# Patient Record
Sex: Female | Born: 1950
Health system: Southern US, Community
[De-identification: ages and names within clinical notes are randomized; demographics above are authoritative.]

## PROBLEM LIST (undated history)

## (undated) DIAGNOSIS — B49 Unspecified mycosis: Secondary | ICD-10-CM

## (undated) DIAGNOSIS — F329 Major depressive disorder, single episode, unspecified: Secondary | ICD-10-CM

## (undated) DIAGNOSIS — E7800 Pure hypercholesterolemia, unspecified: Secondary | ICD-10-CM

## (undated) DIAGNOSIS — F32A Depression, unspecified: Secondary | ICD-10-CM

## (undated) DIAGNOSIS — I10 Essential (primary) hypertension: Secondary | ICD-10-CM

## (undated) DIAGNOSIS — F419 Anxiety disorder, unspecified: Secondary | ICD-10-CM

## (undated) DIAGNOSIS — R51 Headache: Secondary | ICD-10-CM

## (undated) DIAGNOSIS — G4733 Obstructive sleep apnea (adult) (pediatric): Secondary | ICD-10-CM

## (undated) DIAGNOSIS — R030 Elevated blood-pressure reading, without diagnosis of hypertension: Secondary | ICD-10-CM

## (undated) HISTORY — DX: Essential (primary) hypertension: I10

## (undated) HISTORY — DX: Obstructive sleep apnea (adult) (pediatric): G47.33

## (undated) HISTORY — DX: Unspecified mycosis: B49

## (undated) HISTORY — PX: BREAST EXCISIONAL BIOPSY: SUR124

## (undated) HISTORY — DX: Pure hypercholesterolemia, unspecified: E78.00

## (undated) HISTORY — DX: Major depressive disorder, single episode, unspecified: F32.9

## (undated) HISTORY — DX: Depression, unspecified: F32.A

## (undated) HISTORY — DX: Headache: R51

## (undated) HISTORY — DX: Anxiety disorder, unspecified: F41.9

---
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
## 1998-12-25 ENCOUNTER — Other Ambulatory Visit: Admission: RE | Admit: 1998-12-25 | Discharge: 1998-12-25 | Payer: Self-pay | Admitting: Family Medicine

## 2001-01-30 ENCOUNTER — Other Ambulatory Visit: Admission: RE | Admit: 2001-01-30 | Discharge: 2001-01-30 | Payer: Self-pay | Admitting: Family Medicine

## 2002-07-23 ENCOUNTER — Other Ambulatory Visit: Admission: RE | Admit: 2002-07-23 | Discharge: 2002-07-23 | Payer: Self-pay | Admitting: Family Medicine

## 2004-02-24 ENCOUNTER — Other Ambulatory Visit: Admission: RE | Admit: 2004-02-24 | Discharge: 2004-02-24 | Payer: Self-pay | Admitting: Family Medicine

## 2008-01-04 ENCOUNTER — Ambulatory Visit: Payer: Self-pay | Admitting: *Deleted

## 2008-01-04 ENCOUNTER — Encounter: Payer: Self-pay | Admitting: Emergency Medicine

## 2008-01-05 ENCOUNTER — Inpatient Hospital Stay (HOSPITAL_COMMUNITY): Admission: RE | Admit: 2008-01-05 | Discharge: 2008-01-12 | Payer: Self-pay | Admitting: *Deleted

## 2008-01-16 ENCOUNTER — Other Ambulatory Visit (HOSPITAL_COMMUNITY): Admission: RE | Admit: 2008-01-16 | Discharge: 2008-01-28 | Payer: Self-pay | Admitting: Psychiatry

## 2008-01-17 ENCOUNTER — Ambulatory Visit: Payer: Self-pay | Admitting: Psychiatry

## 2008-02-04 ENCOUNTER — Ambulatory Visit (HOSPITAL_COMMUNITY): Payer: Self-pay | Admitting: Psychiatry

## 2008-02-11 ENCOUNTER — Ambulatory Visit (HOSPITAL_COMMUNITY): Payer: Self-pay | Admitting: Psychiatry

## 2008-02-18 ENCOUNTER — Ambulatory Visit (HOSPITAL_COMMUNITY): Payer: Self-pay | Admitting: Psychiatry

## 2008-05-29 ENCOUNTER — Ambulatory Visit (HOSPITAL_COMMUNITY): Admission: RE | Admit: 2008-05-29 | Discharge: 2008-05-29 | Payer: Self-pay | Admitting: Family Medicine

## 2009-04-09 ENCOUNTER — Other Ambulatory Visit (HOSPITAL_COMMUNITY): Payer: Self-pay | Admitting: Emergency Medicine

## 2009-04-09 ENCOUNTER — Ambulatory Visit: Payer: Self-pay | Admitting: Psychiatry

## 2009-04-10 ENCOUNTER — Inpatient Hospital Stay (HOSPITAL_COMMUNITY): Admission: EM | Admit: 2009-04-10 | Discharge: 2009-04-20 | Payer: Self-pay | Admitting: Psychiatry

## 2009-05-11 ENCOUNTER — Ambulatory Visit (HOSPITAL_COMMUNITY): Payer: Self-pay | Admitting: Psychiatry

## 2009-05-21 ENCOUNTER — Ambulatory Visit (HOSPITAL_COMMUNITY): Payer: Self-pay | Admitting: Psychiatry

## 2009-06-23 ENCOUNTER — Ambulatory Visit (HOSPITAL_COMMUNITY): Payer: Self-pay | Admitting: Psychiatry

## 2009-06-30 ENCOUNTER — Ambulatory Visit (HOSPITAL_COMMUNITY): Payer: Self-pay | Admitting: Psychiatry

## 2009-07-09 ENCOUNTER — Ambulatory Visit (HOSPITAL_COMMUNITY): Payer: Self-pay | Admitting: Psychiatry

## 2009-07-21 ENCOUNTER — Ambulatory Visit (HOSPITAL_COMMUNITY): Payer: Self-pay | Admitting: Psychiatry

## 2009-07-23 ENCOUNTER — Ambulatory Visit (HOSPITAL_COMMUNITY): Payer: Self-pay | Admitting: Psychiatry

## 2009-08-18 ENCOUNTER — Ambulatory Visit (HOSPITAL_COMMUNITY): Payer: Self-pay | Admitting: Psychiatry

## 2009-09-01 ENCOUNTER — Ambulatory Visit (HOSPITAL_COMMUNITY): Payer: Self-pay | Admitting: Psychiatry

## 2009-10-13 ENCOUNTER — Ambulatory Visit (HOSPITAL_COMMUNITY): Payer: Self-pay | Admitting: Psychiatry

## 2009-12-08 ENCOUNTER — Ambulatory Visit (HOSPITAL_COMMUNITY): Payer: Self-pay | Admitting: Psychiatry

## 2010-01-07 ENCOUNTER — Ambulatory Visit (HOSPITAL_COMMUNITY): Payer: Self-pay | Admitting: Psychiatry

## 2010-02-09 ENCOUNTER — Ambulatory Visit (HOSPITAL_COMMUNITY): Payer: Self-pay | Admitting: Psychiatry

## 2010-02-15 ENCOUNTER — Ambulatory Visit (HOSPITAL_COMMUNITY): Payer: Self-pay | Admitting: Psychiatry

## 2010-03-01 ENCOUNTER — Ambulatory Visit (HOSPITAL_COMMUNITY): Payer: Self-pay | Admitting: Psychiatry

## 2010-04-06 ENCOUNTER — Ambulatory Visit (HOSPITAL_COMMUNITY): Payer: Self-pay | Admitting: Psychiatry

## 2010-06-03 ENCOUNTER — Ambulatory Visit (HOSPITAL_COMMUNITY)
Admission: RE | Admit: 2010-06-03 | Discharge: 2010-06-03 | Payer: Self-pay | Source: Home / Self Care | Attending: Psychiatry | Admitting: Psychiatry

## 2010-06-08 ENCOUNTER — Ambulatory Visit (HOSPITAL_COMMUNITY)
Admission: RE | Admit: 2010-06-08 | Discharge: 2010-06-08 | Payer: Self-pay | Source: Home / Self Care | Attending: Psychiatry | Admitting: Psychiatry

## 2010-06-21 ENCOUNTER — Encounter (HOSPITAL_COMMUNITY): Payer: BC Managed Care – PPO | Admitting: Psychiatry

## 2010-06-28 ENCOUNTER — Encounter (INDEPENDENT_AMBULATORY_CARE_PROVIDER_SITE_OTHER): Payer: BC Managed Care – PPO | Admitting: Psychiatry

## 2010-06-28 DIAGNOSIS — F331 Major depressive disorder, recurrent, moderate: Secondary | ICD-10-CM

## 2010-07-14 ENCOUNTER — Encounter (HOSPITAL_COMMUNITY): Payer: BC Managed Care – PPO | Admitting: Psychiatry

## 2010-07-29 ENCOUNTER — Encounter (INDEPENDENT_AMBULATORY_CARE_PROVIDER_SITE_OTHER): Payer: BC Managed Care – PPO | Admitting: Psychiatry

## 2010-07-29 DIAGNOSIS — F331 Major depressive disorder, recurrent, moderate: Secondary | ICD-10-CM

## 2010-08-06 ENCOUNTER — Encounter (HOSPITAL_COMMUNITY): Payer: BC Managed Care – PPO | Admitting: Psychiatry

## 2010-08-10 LAB — CBC
Hemoglobin: 12.9 g/dL (ref 12.0–15.0)
RBC: 4.49 MIL/uL (ref 3.87–5.11)
RDW: 13.1 % (ref 11.5–15.5)

## 2010-08-10 LAB — BASIC METABOLIC PANEL
Calcium: 9.5 mg/dL (ref 8.4–10.5)
GFR calc Af Amer: >60 mL/min (ref >60)
GFR calc non Af Amer: >60 mL/min (ref >60)
Sodium: 140 mEq/L (ref 135–145)

## 2010-08-10 LAB — URINE MICROSCOPIC-ADD ON

## 2010-08-10 LAB — DIFFERENTIAL
Basophils Absolute: 0 10*3/uL (ref 0.0–0.1)
Lymphocytes Relative: 22 % (ref 12–46)
Monocytes Absolute: 0.5 10*3/uL (ref 0.1–1.0)
Monocytes Relative: 8 % (ref 3–12)
Neutro Abs: 3.9 10*3/uL (ref 1.7–7.7)

## 2010-08-10 LAB — URINALYSIS, ROUTINE W REFLEX MICROSCOPIC
Bilirubin Urine: NEGATIVE
Glucose, UA: NEGATIVE mg/dL
Ketones, ur: NEGATIVE mg/dL
pH: 5.5 (ref 5.0–8.0)

## 2010-08-10 LAB — ETHANOL: Alcohol, Ethyl (B): <5 mg/dL (ref 0–10)

## 2010-08-10 LAB — TSH: TSH: 0.919 u[IU]/mL (ref 0.350–4.500)

## 2010-08-10 LAB — RAPID URINE DRUG SCREEN, HOSP PERFORMED
Cocaine: NOT DETECTED
Opiates: NOT DETECTED
Tetrahydrocannabinol: NOT DETECTED

## 2010-09-21 NOTE — Discharge Summary (Signed)
NAMEEDWIN, CHERIAN NO.:  [id number]   MEDICAL RECORD NO.:  [id number]          PATIENT TYPE:  IPS   LOCATION:  0307                          FACILITY:  [facility]   PHYSICIAN:  Jasmine Pang, M.D. DATE OF BIRTH:  11/15/1950   DATE OF ADMISSION:  01/05/2008  DATE OF DISCHARGE:  01/12/2008                               DISCHARGE SUMMARY   IDENTIFICATION:  This is a 60 year old widowed white female who came to  the hospital on a voluntary basis on January 04, 2008.   HISTORY OF PRESENT ILLNESS:  The patient had been to see her doctor and  reported she was having disturbing intrusive thoughts.  These were  homicidal thoughts.  She stated she has been having these thoughts since  several days ago and they are getting worse.  She is afraid she would  act on them and the targets are her mother and her sons.  She is afraid  to go home.  She states she knows she would not harm them, but is very  disturbed by these thoughts and feels out of control.  The patient is  under considerable stress.  Her father died last 2022-07-15 after an  illness.  Her husband died 10 years ago suddenly leaving her to raise 3  young boys alone.  Mother has been ill and has recently gone to a  nursing home.  The patient is handling mother's affairs, which is very  complicated.  Her 37 year old son is in college and she is trying to get  financial aid.  Her 59 year old and 75 year old son live in the home.  Her 16 year old son is married.   PAST PSYCHIATRIC HISTORY:  This is the first [facility] admission for the  patient.   DRUG AND ALCOHOL HISTORY:  No alcohol or drug abuse.   MEDICAL HISTORY:  Hypercholesterolemia.   MEDICATIONS:  Zoloft.   PHYSICAL FINDINGS:  There were no acute physical or medical problems  noted.  Her full physical exam was done in the [facility] ED.   ADMISSION LABORATORIES:  Alcohol level was less than 5.  UA revealed  wbc's of 3-6.  UDS was negative.  Acetaminophen was  less than 10.   HOSPITAL COURSE:  Upon admission, the patient was restarted on her  Zoloft 50 mg p.o. daily.  She was also placed on Risperdal 0.5 mg p.o.  q.h.s.  She was also started on Ativan 0.5 mg p.o. q.6 h. p.r.n.  anxiety.  In individual sessions, the patient continued to feel anxious  about her intrusive thoughts.  Her mood was depressed.  Affect  consistent with mood, tearful.  There was positive suicidal ideation.  She was still struggling with thoughts of suicide due to her intrusive  thoughts.  There were no auditory or visual hallucinations.  She was  worried she would not get better and this made her more anxious.  Mood  was depressed and anxious.  On January 10, 2008, she was tearful and  wringing her hands.  She still has intrusive thoughts of hurting others.  She still has positive suicidal ideation.  She has a  family session with  her older son and mother-in-law scheduled.  She sees them both as a  support.  The Zoloft was increased to 100 mg p.o. daily to help with  depression and anxiety.  She was also started on Ativan 0.5 mg p.o.  q.i.d. as a standing dose, p.r.n. Ativan was continued.  She remained  worried about all of the work she had to do when she left the hospital.  She was in charge of her mother's financial and legal affairs, since her  mother who developed Alzheimer's has gone into a nursing home.  On  January 11, 2008, mental status had improved somewhat.  There was still  some anxiety.  Her sleep was good.  Appetite was good.  She was having  some intrusive thoughts, but they were less than earlier.  She has no  plans to harm anyone and she is able to distinguish the facts these are  false not something she plans to do.  Her suicidal ideation had  resolved.  The patient's mother-in-law and son were very supportive of  the patient during the hospitalization and in the family session.  The  patient felt relieved that her 15 year old son was supportive and  was  not distancing himself because of her intrusive thoughts.  She was  hardened to hear him saying he still loved her very much.  On January 12, 2008, the patient stated I feel much better.  Sleep was good.  Appetite was good.  Mood was less depressed and less anxious.  Affect  consistent with mood.  There was no suicidal or homicidal ideation.  No  thoughts of self-injurious behavior.  No auditory or visual  hallucinations.  No paranoia or delusions.  Thoughts were logical and  goal-directed.  Thought content, no predominant theme.  Cognitive was  grossly intact.  Insight was good.  Judgment was good.  Impulse control  was good.  It was felt the patient was safe for discharge today and she  felt ready to go home.  She reported she was no longer distressed by her  intrusive thoughts and was able to make them go away when they did  insert themselves.   DISCHARGE DIAGNOSES:  Axis I: Major depression, recurrent, severe  without psychosis, features of obsessive-compulsive disorder.  Axis II: None.  Axis III: Hypercholesterolemia.  Axis IV: Moderate (problems primary support group, economic problem,  other psychosocial problems).  Axis V: Global assessment of functioning was 50 upon discharge.  GAF was  35 upon admission.  GAF highest past year was 75-80.   DISCHARGE PLANS:  There was no specific activity or dietary restriction.   POST HOSPITAL CARE PLANS:  The patient will go to the [facility]  Intensive Outpatient Program on Tuesday at 8:30 a.m., on January 15, 2008.   DISCHARGE MEDICATIONS:  1. Risperdal 0.5 mg p.o. q.h.s.  2. Zoloft 100 mg daily.  3. Ativan 0.5 mg four times daily and once daily as needed.      Jasmine Pang, M.D.  Electronically Signed     [facility]/MEDQ  D:  01/12/2008  T:  01/12/2008  Job:  [id number]

## 2010-09-23 ENCOUNTER — Encounter (HOSPITAL_COMMUNITY): Payer: BC Managed Care – PPO | Admitting: Psychiatry

## 2010-09-30 ENCOUNTER — Encounter (INDEPENDENT_AMBULATORY_CARE_PROVIDER_SITE_OTHER): Payer: BC Managed Care – PPO | Admitting: Psychiatry

## 2010-09-30 DIAGNOSIS — F331 Major depressive disorder, recurrent, moderate: Secondary | ICD-10-CM

## 2010-10-28 ENCOUNTER — Encounter (INDEPENDENT_AMBULATORY_CARE_PROVIDER_SITE_OTHER): Payer: BC Managed Care – PPO | Admitting: Psychiatry

## 2010-10-28 DIAGNOSIS — F331 Major depressive disorder, recurrent, moderate: Secondary | ICD-10-CM

## 2010-11-05 ENCOUNTER — Encounter (INDEPENDENT_AMBULATORY_CARE_PROVIDER_SITE_OTHER): Payer: BC Managed Care – PPO | Admitting: Psychiatry

## 2010-11-05 DIAGNOSIS — F331 Major depressive disorder, recurrent, moderate: Secondary | ICD-10-CM

## 2010-11-17 ENCOUNTER — Other Ambulatory Visit: Payer: Self-pay | Admitting: Family Medicine

## 2010-11-17 DIAGNOSIS — R413 Other amnesia: Secondary | ICD-10-CM

## 2010-11-19 ENCOUNTER — Other Ambulatory Visit (HOSPITAL_COMMUNITY): Payer: BC Managed Care – PPO

## 2010-11-19 ENCOUNTER — Encounter (INDEPENDENT_AMBULATORY_CARE_PROVIDER_SITE_OTHER): Payer: Federal, State, Local not specified - PPO | Admitting: Psychiatry

## 2010-11-19 DIAGNOSIS — F331 Major depressive disorder, recurrent, moderate: Secondary | ICD-10-CM

## 2010-11-22 ENCOUNTER — Ambulatory Visit (HOSPITAL_COMMUNITY)
Admission: RE | Admit: 2010-11-22 | Discharge: 2010-11-22 | Disposition: A | Discharge status: Home / Self Care | Payer: Federal, State, Local not specified - PPO | Source: Ambulatory Visit | Attending: Family Medicine | Admitting: Family Medicine

## 2010-11-22 ENCOUNTER — Encounter (HOSPITAL_COMMUNITY): Payer: Self-pay

## 2010-11-22 DIAGNOSIS — R413 Other amnesia: Secondary | ICD-10-CM | POA: Insufficient documentation

## 2010-12-03 ENCOUNTER — Encounter (INDEPENDENT_AMBULATORY_CARE_PROVIDER_SITE_OTHER): Payer: Federal, State, Local not specified - PPO | Admitting: Psychiatry

## 2010-12-03 DIAGNOSIS — F331 Major depressive disorder, recurrent, moderate: Secondary | ICD-10-CM

## 2010-12-09 ENCOUNTER — Encounter (INDEPENDENT_AMBULATORY_CARE_PROVIDER_SITE_OTHER): Payer: Federal, State, Local not specified - PPO | Admitting: Psychiatry

## 2010-12-09 DIAGNOSIS — F331 Major depressive disorder, recurrent, moderate: Secondary | ICD-10-CM

## 2010-12-17 ENCOUNTER — Encounter (HOSPITAL_COMMUNITY): Payer: Federal, State, Local not specified - PPO | Admitting: Psychiatry

## 2011-01-20 ENCOUNTER — Encounter (INDEPENDENT_AMBULATORY_CARE_PROVIDER_SITE_OTHER): Payer: Federal, State, Local not specified - PPO | Admitting: Psychiatry

## 2011-01-20 DIAGNOSIS — F331 Major depressive disorder, recurrent, moderate: Secondary | ICD-10-CM

## 2011-01-21 ENCOUNTER — Encounter (HOSPITAL_COMMUNITY): Payer: Federal, State, Local not specified - PPO | Admitting: Psychiatry

## 2011-03-16 ENCOUNTER — Encounter (HOSPITAL_COMMUNITY): Payer: Self-pay | Admitting: Psychiatry

## 2011-03-17 ENCOUNTER — Ambulatory Visit (INDEPENDENT_AMBULATORY_CARE_PROVIDER_SITE_OTHER): Payer: Federal, State, Local not specified - PPO | Admitting: Psychiatry

## 2011-03-17 ENCOUNTER — Encounter (HOSPITAL_COMMUNITY): Payer: Federal, State, Local not specified - PPO | Admitting: Psychiatry

## 2011-03-17 DIAGNOSIS — F331 Major depressive disorder, recurrent, moderate: Secondary | ICD-10-CM

## 2011-03-17 MED ORDER — RISPERIDONE 3 MG PO TABS: 3.0000 mg | ORAL_TABLET | Freq: Every day | ORAL | Status: DC

## 2011-03-17 MED ORDER — CLONAZEPAM 0.5 MG PO TABS: 0.5000 mg | ORAL_TABLET | Freq: Two times a day (BID) | ORAL | Status: DC | PRN

## 2011-03-17 MED ORDER — BENZTROPINE MESYLATE 0.5 MG PO TABS: 0.5000 mg | ORAL_TABLET | Freq: Every day | ORAL | Status: DC

## 2011-03-17 MED ORDER — SERTRALINE HCL 100 MG PO TABS: 100.0000 mg | ORAL_TABLET | Freq: Every day | ORAL | Status: DC

## 2011-03-17 MED ORDER — BUPROPION HCL ER (XL) 300 MG PO TB24: 300.0000 mg | ORAL_TABLET | Freq: Every day | ORAL | Status: DC

## 2011-03-17 NOTE — Progress Notes (Signed)
Patient came for her followup appointment she has been noticed more depressed lately. She is more disappointed as she did not get the job. She is concerned about her financial stress and not taking money out of her IRA though she is still looking for a job and may get another job interview next week. Her son started school in January for nursing and she is very happy about. She endorses decreased energy more crying spells and sometimes feeling of helpless. However ever her appetite and sleep is unchanged. She continued to have ruminations about her not able to get a job and able to help her family. She endorse racing thoughts and occasional crying spells. I reviewed the chart she was taking 3 mg as above and we have reduced to 2 mg in past few months and she was doing better, I discussed with the patient to restart 3 mg to target her residual depression. She denies any agitation anger paranoid thinking or any side effects of medication. She is compliant with her medication which are Zoloft Klonopin Cogentin Wellbutrin and Risperdal. There are no abnormal movements noted. Mental status examination patient is casually dressed and fairly groomed. Her speech is slow but coherent. She described her mood as depressed and anxious and her affect was constricted. She denies any active or passive suicidal thinking or any auditory or visual hallucination. There were no paranoid thinking but she has poverty of thought content. She is alert and oriented x3. Her thought process is logical linear but slow. Her insight judgment and impulse control is okay. Assessment Maj. depressive disorder recurrent Plan we will increase her Risperdal to 3 mg at bedtime, she will continue her Klonopin Zoloft Cogentin. We also talked about safety plan that in case if she starts feeling worse in other depression or any time having suicidal thinking and homicidal thinking then she need to call us immediately. I have increased the risk and  benefits of medication including the metabolic side effects of Risperdal. One more time I encouraged her to see a therapist however patient declined. I will see her again in 3-4 weeks. Time spent 45 minutes.

## 2011-04-12 ENCOUNTER — Ambulatory Visit (HOSPITAL_COMMUNITY): Payer: Federal, State, Local not specified - PPO | Admitting: Psychiatry

## 2011-04-14 ENCOUNTER — Other Ambulatory Visit (HOSPITAL_COMMUNITY): Payer: Self-pay | Admitting: Psychiatry

## 2011-04-16 ENCOUNTER — Other Ambulatory Visit (HOSPITAL_COMMUNITY): Payer: Self-pay | Admitting: Psychiatry

## 2011-04-17 ENCOUNTER — Other Ambulatory Visit (HOSPITAL_COMMUNITY): Payer: Self-pay | Admitting: Psychiatry

## 2011-04-18 ENCOUNTER — Other Ambulatory Visit (HOSPITAL_COMMUNITY): Payer: Self-pay | Admitting: Psychiatry

## 2011-04-20 ENCOUNTER — Other Ambulatory Visit (HOSPITAL_COMMUNITY): Payer: Self-pay | Admitting: Psychiatry

## 2011-04-21 ENCOUNTER — Other Ambulatory Visit (HOSPITAL_COMMUNITY): Payer: Self-pay | Admitting: Psychiatry

## 2011-05-24 ENCOUNTER — Other Ambulatory Visit (HOSPITAL_COMMUNITY): Payer: Self-pay | Admitting: Psychiatry

## 2011-05-24 DIAGNOSIS — F331 Major depressive disorder, recurrent, moderate: Secondary | ICD-10-CM

## 2011-05-24 MED ORDER — BENZTROPINE MESYLATE 0.5 MG PO TABS: 0.5000 mg | ORAL_TABLET | Freq: Every day | ORAL | Status: DC

## 2011-05-24 MED ORDER — RISPERIDONE 3 MG PO TABS: 3.0000 mg | ORAL_TABLET | Freq: Every day | ORAL | Status: DC

## 2011-05-24 MED ORDER — CLONAZEPAM 0.5 MG PO TABS: 0.5000 mg | ORAL_TABLET | Freq: Two times a day (BID) | ORAL | Status: DC | PRN

## 2011-05-24 MED ORDER — BUPROPION HCL ER (XL) 300 MG PO TB24: 300.0000 mg | ORAL_TABLET | Freq: Every day | ORAL | Status: DC

## 2011-05-24 MED ORDER — SERTRALINE HCL 100 MG PO TABS: 100.0000 mg | ORAL_TABLET | Freq: Every day | ORAL | Status: DC

## 2011-06-02 ENCOUNTER — Ambulatory Visit (INDEPENDENT_AMBULATORY_CARE_PROVIDER_SITE_OTHER): Payer: Federal, State, Local not specified - PPO | Admitting: Psychiatry

## 2011-06-02 DIAGNOSIS — F331 Major depressive disorder, recurrent, moderate: Secondary | ICD-10-CM

## 2011-06-02 MED ORDER — BENZTROPINE MESYLATE 0.5 MG PO TABS: 0.5000 mg | ORAL_TABLET | Freq: Every day | ORAL | Status: DC

## 2011-06-02 MED ORDER — RISPERIDONE 3 MG PO TABS: 3.0000 mg | ORAL_TABLET | Freq: Every day | ORAL | Status: DC

## 2011-06-02 MED ORDER — BUPROPION HCL ER (XL) 300 MG PO TB24: 300.0000 mg | ORAL_TABLET | Freq: Every day | ORAL | Status: DC

## 2011-06-02 MED ORDER — CLONAZEPAM 0.5 MG PO TABS: 0.5000 mg | ORAL_TABLET | Freq: Two times a day (BID) | ORAL | Status: DC | PRN

## 2011-06-02 MED ORDER — SERTRALINE HCL 100 MG PO TABS: 100.0000 mg | ORAL_TABLET | Freq: Every day | ORAL | Status: DC

## 2011-06-02 NOTE — Progress Notes (Signed)
Patient came for her followup appointment. She is doing better from the past. She likes increase Risperdal which is helping her sleep. She also excited as she is able to get a job and will start next week. Patient younger son also going to [place] as he also got job. Overall patient is doing better and reported no concern or side effects of medication. She denies any crying spells agitation or social isolation. She still has a lot of financial distress as she to cut money from her husband's retirement but she is relief as she is now got a job. She denies any tremors or side effects of medication. She is compliant with her medication which are Zoloft Klonopin Cogentin Wellbutrin and Risperdal.  Patient had missed her last appointment. Patient has history of multiple no shows and I have discussed in detail about the hospital policy that she need to keep appointment.   Mental status examination  Patient is casually dressed and fairly groomed. She described her mood is good and her affect is bright from the past. Her attention and concentration is also improved from the past. Her speech is soft and clear. Her thought process is slow but logical linear and goal-directed. She denies any active or passive suicidal thoughts or homicidal thoughts. There no psychotic symptoms present. She's alert and oriented x3. Her insight judgment and pulse control is okay.   Assessment  Maj. depressive disorder recurrent  Plan  At this time patient is doing better with increased Risperdal and with the hope that new job to help her financially. I explained risks and benefits of medication. At this time patient does not want to see therapist despite encouragement. we will increase her Risperdal to 3 mg at bedtime, she will continue her Klonopin Zoloft Cogentin. We also talked about safety plan that in case if she starts feeling worse in other depression or any time having suicidal thinking and homicidal thinking then she need  to call us immediately. I will see her again in 8 weeks. Refills of all her medication is given.

## 2011-06-16 ENCOUNTER — Other Ambulatory Visit (HOSPITAL_COMMUNITY): Payer: Self-pay | Admitting: Psychiatry

## 2011-06-29 ENCOUNTER — Other Ambulatory Visit (HOSPITAL_COMMUNITY): Payer: Self-pay | Admitting: Psychiatry

## 2011-06-30 ENCOUNTER — Other Ambulatory Visit (HOSPITAL_COMMUNITY): Payer: Self-pay | Admitting: Psychiatry

## 2011-07-19 ENCOUNTER — Other Ambulatory Visit (HOSPITAL_COMMUNITY): Payer: Self-pay | Admitting: Psychiatry

## 2011-07-19 DIAGNOSIS — F331 Major depressive disorder, recurrent, moderate: Secondary | ICD-10-CM

## 2011-07-21 ENCOUNTER — Other Ambulatory Visit (HOSPITAL_COMMUNITY): Payer: Self-pay | Admitting: Psychiatry

## 2011-08-02 ENCOUNTER — Ambulatory Visit (INDEPENDENT_AMBULATORY_CARE_PROVIDER_SITE_OTHER): Payer: Federal, State, Local not specified - PPO | Admitting: Psychiatry

## 2011-08-02 ENCOUNTER — Encounter (HOSPITAL_COMMUNITY): Payer: Self-pay | Admitting: Psychiatry

## 2011-08-02 VITALS — Wt 168.0 lb

## 2011-08-02 DIAGNOSIS — F331 Major depressive disorder, recurrent, moderate: Secondary | ICD-10-CM

## 2011-08-02 MED ORDER — CLONAZEPAM 0.5 MG PO TABS: 0.5000 mg | ORAL_TABLET | Freq: Two times a day (BID) | ORAL | Status: DC | PRN

## 2011-08-02 MED ORDER — RISPERIDONE 3 MG PO TABS: 3.0000 mg | ORAL_TABLET | Freq: Every day | ORAL | Status: DC

## 2011-08-02 MED ORDER — BENZTROPINE MESYLATE 0.5 MG PO TABS
0.5000 mg | ORAL_TABLET | Freq: Every day | ORAL | Status: DC
Start: 2011-08-02 — End: 2011-11-08

## 2011-08-02 MED ORDER — SERTRALINE HCL 100 MG PO TABS: ORAL_TABLET | ORAL | Status: DC

## 2011-08-02 MED ORDER — BUPROPION HCL ER (XL) 300 MG PO TB24: 300.0000 mg | ORAL_TABLET | Freq: Every day | ORAL | Status: DC

## 2011-08-02 NOTE — Progress Notes (Signed)
Chief complaint Medication management and followup.  History of present illness Patient is 61 years old widowed female who came for her followup appointment.  She's been compliant with her medication and reported no side effects.  Recently she has been more anxious and nervous due to financial distress.  She is concerned about not getting job and afraid that she may have to lose her home .  She is using her savings but realize that she needs money for the future .  Patient's son is also actively looking for a job however so far he has not find anything.  Patient is compliant with the medication and reported no side effects.  She likes her current psychiatric medication.  She denies any agitation anger or mood swings.  She denies any crying spells however she has a chronic depression .  She does not want to see therapist as she cannot afford for counseling.  She uses her coping skills to manage her depression most of the time .  She denies any active or passive suicidal thinking and homicidal thinking.    Current psychiatric medication Klonopin 0.5 mg twice a day as needed Risperdal 3 mg at bedtime Wellbutrin 300 mg daily  Zoloft 200 mg daily  Cogentin 0.5 mg at bedtime  Medical history Patient has a high cholesterol and headache.  She see Paulita Cradle.  She is scheduled to see her primary care physician in 3 months for regular checkup and blood work.    Mental status examination  Patient is casually dressed and fairly groomed. She described her mood is anxious and her affect is constricted.  She's cooperative and maintained good eye contact.  Her speech is clear and coherent.  Her thought process logical linear and goal-directed.  Her attention and concentration is fair.  She denies any active or passive suicidal thinking and homicidal thinking.  She denies any auditory or visual hallucination.  There were no psychotic symptoms present at this time.  She's alert and oriented x3.  Her insight  judgment and impulse control is okay.  Assessment Axis I Major depressive disorder recurrent Axis II deferred Axis III see medical history Axis IV moderate   Plan  At this time patient is fairly stable on her current psychotropic medication however she continues to have chronic depression and psychosocial stressors .  She is actively looking for a job .  She is tolerating her medication and denies any side effects.  I will continue all her psychotropic medication.  We will followup on her blood results with her primary care physician.  I recommended to call us if she feels worsening of her symptoms or any question about the medication.  I also talked about the safety plan that at any time if she feels having suicidal thoughts or homicidal thoughts and she need to call 911 or go to local emergency room.  I will see her again in 2 months.

## 2011-09-29 ENCOUNTER — Ambulatory Visit (HOSPITAL_COMMUNITY): Payer: Federal, State, Local not specified - PPO | Admitting: Psychiatry

## 2011-10-02 ENCOUNTER — Other Ambulatory Visit (HOSPITAL_COMMUNITY): Payer: Self-pay | Admitting: Psychiatry

## 2011-10-03 ENCOUNTER — Other Ambulatory Visit (HOSPITAL_COMMUNITY): Payer: Self-pay | Admitting: Psychiatry

## 2011-10-03 NOTE — Telephone Encounter (Signed)
Needs appointment for future refills.

## 2011-10-29 ENCOUNTER — Other Ambulatory Visit (HOSPITAL_COMMUNITY): Payer: Self-pay | Admitting: Psychiatry

## 2011-10-31 ENCOUNTER — Other Ambulatory Visit (HOSPITAL_COMMUNITY): Payer: Self-pay | Admitting: Psychiatry

## 2011-10-31 ENCOUNTER — Telehealth (HOSPITAL_COMMUNITY): Payer: Self-pay | Admitting: *Deleted

## 2011-11-08 ENCOUNTER — Encounter (HOSPITAL_COMMUNITY): Payer: Self-pay | Admitting: Psychiatry

## 2011-11-08 ENCOUNTER — Ambulatory Visit (INDEPENDENT_AMBULATORY_CARE_PROVIDER_SITE_OTHER): Payer: Federal, State, Local not specified - PPO | Admitting: Psychiatry

## 2011-11-08 VITALS — Wt 166.8 lb

## 2011-11-08 DIAGNOSIS — F331 Major depressive disorder, recurrent, moderate: Secondary | ICD-10-CM

## 2011-11-08 DIAGNOSIS — F418 Other specified anxiety disorders: Secondary | ICD-10-CM | POA: Insufficient documentation

## 2011-11-08 DIAGNOSIS — F329 Major depressive disorder, single episode, unspecified: Secondary | ICD-10-CM

## 2011-11-08 DIAGNOSIS — Z79899 Other long term (current) drug therapy: Secondary | ICD-10-CM

## 2011-11-08 LAB — CBC WITH DIFFERENTIAL/PLATELET
Basophils Absolute: 0 10*3/uL (ref 0.0–0.1)
Eosinophils Absolute: 0.2 10*3/uL (ref 0.0–0.7)
Eosinophils Relative: 3 % (ref 0–5)
HCT: 36.2 % (ref 36.0–46.0)
Lymphocytes Relative: 26 % (ref 12–46)
MCH: 28.3 pg (ref 26.0–34.0)
MCHC: 34.5 g/dL (ref 30.0–36.0)
MCV: 82.1 fL (ref 78.0–100.0)
Monocytes Absolute: 0.6 10*3/uL (ref 0.1–1.0)
RDW: 13.9 % (ref 11.5–15.5)
WBC: 5.8 10*3/uL (ref 4.0–10.5)

## 2011-11-08 LAB — COMPREHENSIVE METABOLIC PANEL
ALT: 20 U/L (ref 0–35)
AST: 15 U/L (ref 0–37)
Alkaline Phosphatase: 57 U/L (ref 39–117)
Creat: 1 mg/dL (ref 0.50–1.10)
Total Bilirubin: 0.3 mg/dL (ref 0.3–1.2)

## 2011-11-08 MED ORDER — CLONAZEPAM 0.5 MG PO TABS: 0.5000 mg | ORAL_TABLET | Freq: Two times a day (BID) | ORAL | Status: DC | PRN

## 2011-11-08 MED ORDER — BUPROPION HCL ER (XL) 300 MG PO TB24: 300.0000 mg | ORAL_TABLET | Freq: Every day | ORAL | Status: DC

## 2011-11-08 MED ORDER — RISPERIDONE 3 MG PO TABS: 3.0000 mg | ORAL_TABLET | Freq: Every day | ORAL | Status: DC

## 2011-11-08 MED ORDER — SERTRALINE HCL 100 MG PO TABS: 100.0000 mg | ORAL_TABLET | Freq: Every day | ORAL | Status: DC

## 2011-11-08 MED ORDER — BENZTROPINE MESYLATE 0.5 MG PO TABS: 0.5000 mg | ORAL_TABLET | Freq: Every day | ORAL | Status: DC

## 2011-11-08 NOTE — Progress Notes (Signed)
Chief complaint Medication management and followup.  History of present illness Patient is 61 years old widowed female who came for her followup appointment.  She had missed her appointment and last seen in March.  She admitted been out of her medication and feeling more depressed and isolated.  She continues to have struggle getting job.   She is now thinking to contact her brother in law who lives in [place] to get some help to find a job.  Patient has been was working in [employer] who died 14 years ago and her brother who also works in Research officer, political party.  Patient admitted recently she's feeling more sad depressed with some crying spells.  She's out of for Wellbutrin and Zoloft.  Though she denies any active suicidal thinking but endorse feeling of hopelessness and helplessness.  She's also very disappointed as her middle son not able to find a job.  2 of her son lives with the patient .  She also very involved taking care of mother-in-law .  She is using her savings but realize that she need money for the future.  She likes her current psychiatric medication and reported no side effects.  She does not want to see therapist as she cannot afford co-pay.  She is using her coping skills to manage her depression .  She denies any hallucination or any violence.  She denies any tremors or shakes.  She sleeping better with Klonopin.  She's not drinking or using any illegal substance.  Current psychiatric medication Klonopin 0.5 mg twice a day as needed Risperdal 3 mg at bedtime Wellbutrin 300 mg daily  Zoloft 200 mg daily  Cogentin 0.5 mg at bedtime  Psychiatric history Patient has been seeing in this office since January 2011.  [facility].  She was admitted due to worsening of the depression and having intrusive thoughts in her head.  She was unable to function.  In the past patient has been tried Paxil that primary care physician.  She was  also admitted in [facility]/10/[facility] at behavioral [facility] due to overwhelming thoughts.  At that time she was taking care of her elderly mother who was a nursing home.  Patient has a history of previous suicidal attempt or history of violence.  She has been taking Risperdal since 25-Oct-2007.  She remember feeling depressed since 10-24-97 when her husband died suddenly due to heart problem.    Psychosocial history Patient was born and raised in [place].  Patient raise her children as a single mother since her husband died.  She has 3 children.  Her older son is married .  HER-2 sons lives with her.  Patient has limited family.  She has no brother and sister.  Her parents died .    Alcohol and substance use history Patient denies any history of alcohol or substance use .  Family history Patient has a history of family psychiatric illness.  Education and work  Patient has a Naval architect.  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  Medical history Patient has a high cholesterol and headache.  She see Paulita Cradle in [place] [facility].  She is taking cholesterol medication.  She denies any recent blood work for glucose.    Mental status examination  Patient is casually dressed and fairly groomed. She described her mood is  depressed and anxious and her affect is constricted.  She's cooperative and maintained fair eye contact.  Her speech is clear and coherent.  Her thought process logical linear and goal-directed.  Her attention and concentration is fair.  She denies any active or passive suicidal thinking and homicidal thinking.  She denies any auditory or visual hallucination.  There were no psychotic symptoms present at this time.  She's alert and oriented x3.  Her insight judgment and impulse control is okay.  Assessment Axis I Major depressive disorder recurrent Axis II deferred Axis III see medical  history Axis IV moderate   Plan  I review her symptoms which has been worsening due to noncompliance of her medication for past one week.  I recommend to restart the medication as soon as possible.  I will also do blood work including hemoglobin A1c which has not done in recent months.  I discussed in detail the risk of relapse with noncompliance of medication.  I review her psychosocial stressors and response to the medication.  I encourage her to contact her brother-in-law and other family member who works in Research officer, political party that can be helpful to find a job.  I explained the risk and benefits of medication at this time patient does not have any side effects of medication.  We discussed the safety plan that anytime if she having active suicidal thoughts or homicidal thoughts then she need to call 911 or go to local emergency room.  I will see her again in 4 weeks.  Time spent 30 minutes.  Portion of this note is generated with voice recognition software and may contain typographical error.

## 2011-12-01 ENCOUNTER — Ambulatory Visit (HOSPITAL_COMMUNITY): Payer: Self-pay | Admitting: Psychiatry

## 2011-12-06 ENCOUNTER — Ambulatory Visit (HOSPITAL_COMMUNITY): Payer: Self-pay | Admitting: Psychiatry

## 2011-12-08 ENCOUNTER — Ambulatory Visit (INDEPENDENT_AMBULATORY_CARE_PROVIDER_SITE_OTHER): Payer: Federal, State, Local not specified - PPO | Admitting: Psychiatry

## 2011-12-08 ENCOUNTER — Other Ambulatory Visit (HOSPITAL_COMMUNITY): Payer: Self-pay | Admitting: Psychiatry

## 2011-12-08 ENCOUNTER — Encounter (HOSPITAL_COMMUNITY): Payer: Self-pay | Admitting: Psychiatry

## 2011-12-08 VITALS — Wt 170.0 lb

## 2011-12-08 DIAGNOSIS — F331 Major depressive disorder, recurrent, moderate: Secondary | ICD-10-CM

## 2011-12-08 DIAGNOSIS — F339 Major depressive disorder, recurrent, unspecified: Secondary | ICD-10-CM

## 2011-12-08 MED ORDER — BENZTROPINE MESYLATE 0.5 MG PO TABS: 0.5000 mg | ORAL_TABLET | Freq: Every day | ORAL | Status: DC

## 2011-12-08 MED ORDER — RISPERIDONE 3 MG PO TABS: 3.0000 mg | ORAL_TABLET | Freq: Every day | ORAL | Status: DC

## 2011-12-08 MED ORDER — BUPROPION HCL ER (XL) 300 MG PO TB24: 300.0000 mg | ORAL_TABLET | Freq: Every day | ORAL | Status: DC

## 2011-12-08 MED ORDER — CLONAZEPAM 0.5 MG PO TABS: 0.5000 mg | ORAL_TABLET | Freq: Two times a day (BID) | ORAL | Status: DC | PRN

## 2011-12-08 MED ORDER — SERTRALINE HCL 100 MG PO TABS: 100.0000 mg | ORAL_TABLET | Freq: Every day | ORAL | Status: DC

## 2011-12-08 NOTE — Progress Notes (Addendum)
Chief complaint Medication management and followup.  History of present illness Patient is 61 years old widowed female who came for her followup appointment.  She is taking her medication regularly.  She continued to endorse financial stress .  She is thinking to apply job at [employer] .  She endorse not paying her bills and not getting any help from her family members.  Her son works however he is not helping him due to his own financial responsibility.  Patient has not ask her brother-in-law for job which she was talking on her last visit.  Patient admitted sometimes she feels hopeless and helpless but denies any active or passive suicidal thoughts or homicidal thoughts.  She is using her savings .  She get some help from her mother-in-law .  She is taking multiple psychotropic medication for her depression.  She understand her biggest concern is finance and getting job.  She is now actively trying for appointment.  She denies any side effects of medication.  She denies any anger agitation or any crying spells.  Her sleep remains same.  She denies any hallucination paranoia at this time.  She likes to continue her current psychiatric medication.  Current psychiatric medication Klonopin 0.5 mg twice a day as needed Risperdal 3 mg at bedtime Wellbutrin 300 mg daily  Zoloft 200 mg daily  Cogentin 0.5 mg at bedtime  Psychiatric history Patient has been seeing in this office since January 2011.  [facility].  She was admitted due to worsening of the depression and having intrusive thoughts in her head.  She was unable to function.  In the past patient has been tried Paxil that primary care physician.  She was also admitted in 10/19/2007 at behavioral [facility] due to overwhelming thoughts.  At that time she was taking care of her elderly mother who was a nursing home.  Patient denies any history of previous suicidal attempt or history of violence.  She  has been taking Risperdal since 10/19/07.  She remember feeling depressed since 1997/10/18 when her husband died suddenly due to heart problem.    Psychosocial history Patient was born and raised in [place].  Patient raise her children as a single mother since her husband died.  She has 3 children.  Her older son is married .  HER-2 sons lives with her.  Patient has limited family.  She has no brother and sister.  Her parents died .    Alcohol and substance use history Patient denies any history of alcohol or substance use .  Family history Patient denies any history of family psychiatric illness.  Education and work  Patient has a Naval architect.  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  Medical history Patient has a high cholesterol and headache.  She see Paulita Cradle in [place] [facility].  She is taking cholesterol medication.      Mental status examination  Patient is casually dressed and fairly groomed. She described her mood is anxious and her affect is constricted.  She's cooperative and maintained fair eye contact.  Her speech is clear and coherent.  Her thought process logical linear and goal-directed.  Her attention and concentration is fair.  She denies any active or passive suicidal thinking and homicidal thinking.  She denies any auditory or visual hallucination.  There were no psychotic symptoms  present at this time.  She's alert and oriented x3.  Her insight judgment and impulse control is okay.  Assessment Axis I Major depressive disorder recurrent Axis II deferred Axis III see medical history Axis IV moderate   Plan  I had long discussion with her.  I recommend to contact her brother-in-law , apply for a job in [place] and contact her friends for any opportunity to work.  I also recommend to do regular exercise walking to distract her anxiety.  At this time patient  does afford to see therapist.  I review her blood work including CBC CMP and hemoglobin A 1C which is normal.  Her hemoglobin A1c is 5.3.  I explain in detail the risks and benefits of medication.  I recommend to call us if she feel worsening of the symptoms or if she has any question.  We talk about safety plan that anytime she has suicidal thoughts or homicidal thoughts then she need to call 911 or go to local emergency room.  I will see her again in 6 weeks.  Time spent 30 minutes.  Portion of this note is generated with voice recognition software and may contain typographical error.

## 2011-12-09 ENCOUNTER — Other Ambulatory Visit (HOSPITAL_COMMUNITY): Payer: Self-pay | Admitting: Psychiatry

## 2011-12-11 ENCOUNTER — Other Ambulatory Visit (HOSPITAL_COMMUNITY): Payer: Self-pay | Admitting: Psychiatry

## 2011-12-12 ENCOUNTER — Other Ambulatory Visit (HOSPITAL_COMMUNITY): Payer: Self-pay | Admitting: Psychiatry

## 2011-12-13 ENCOUNTER — Other Ambulatory Visit (HOSPITAL_COMMUNITY): Payer: Self-pay | Admitting: Psychiatry

## 2011-12-14 ENCOUNTER — Other Ambulatory Visit (HOSPITAL_COMMUNITY): Payer: Self-pay | Admitting: Psychiatry

## 2011-12-14 DIAGNOSIS — F331 Major depressive disorder, recurrent, moderate: Secondary | ICD-10-CM

## 2011-12-14 MED ORDER — SERTRALINE HCL 100 MG PO TABS: 200.0000 mg | ORAL_TABLET | Freq: Every day | ORAL | Status: DC

## 2012-02-07 ENCOUNTER — Ambulatory Visit (HOSPITAL_COMMUNITY): Payer: Self-pay | Admitting: Psychiatry

## 2012-02-14 ENCOUNTER — Encounter (HOSPITAL_COMMUNITY): Payer: Self-pay | Admitting: Psychiatry

## 2012-02-14 ENCOUNTER — Ambulatory Visit (INDEPENDENT_AMBULATORY_CARE_PROVIDER_SITE_OTHER): Payer: Federal, State, Local not specified - PPO | Admitting: Psychiatry

## 2012-02-14 DIAGNOSIS — F331 Major depressive disorder, recurrent, moderate: Secondary | ICD-10-CM

## 2012-02-14 DIAGNOSIS — F339 Major depressive disorder, recurrent, unspecified: Secondary | ICD-10-CM

## 2012-02-14 MED ORDER — SERTRALINE HCL 100 MG PO TABS: 200.0000 mg | ORAL_TABLET | Freq: Every day | ORAL | Status: DC

## 2012-02-14 MED ORDER — RISPERIDONE 3 MG PO TABS: 3.0000 mg | ORAL_TABLET | Freq: Every day | ORAL | Status: DC

## 2012-02-14 MED ORDER — BENZTROPINE MESYLATE 0.5 MG PO TABS: 0.5000 mg | ORAL_TABLET | Freq: Every day | ORAL | Status: DC

## 2012-02-14 MED ORDER — BUPROPION HCL ER (XL) 300 MG PO TB24: 300.0000 mg | ORAL_TABLET | Freq: Every day | ORAL | Status: DC

## 2012-02-14 NOTE — Progress Notes (Signed)
Chief complaint Medication management and followup.  History of present illness Patient came for her followup appointment.  She has a job interview at [employer] in [place].  She is very excited and hoping .  She's compliant with the medication.  She sleeping better.  She is concerned about her finances and thinking for yard sale to cover her expenses.  She is very hopeful that she will get the job .  She feels the medicine is working very well.  She has not taken second Klonopin in past one month.  She still has refill remaining.  She denies any side effects of medication.  She's not drinking or using any illegal substance.  She is using her savings.  Her biggest concern is finances.  She denies any recent agitation anger mood swing.  She denies any crying spells.  Her son is working however he has his own Herbalist.     Current psychiatric medication Klonopin 0.5 mg a day as needed Risperdal 3 mg at bedtime Wellbutrin 300 mg daily  Zoloft 200 mg daily  Cogentin 0.5 mg at bedtime  Psychiatric history Patient has been seeing in this office since January 2011.  [facility].  She was admitted due to worsening of the depression and having intrusive thoughts in her head.  She was unable to function.  In the past patient has been tried Paxil that primary care physician.  She was also admitted in Oct 05, 2007 at behavioral Center due to overwhelming thoughts.  At that time she was taking care of her elderly mother who was a nursing home.  Patient denies any history of previous suicidal attempt or history of violence.  She has been taking Risperdal since Oct 05, 2007.  She remember feeling depressed since 10-04-1997 when her husband died suddenly due to heart problem.    Psychosocial history Patient was born and raised in [place].  Patient raise her children as a single mother since her husband died.  She has 3 children.  Her older son is  married .  Her-2 sons lives with her.  Patient has limited family Support.  She has no brother and sister.  Her parents died .    Alcohol and substance use history Patient denies any history of alcohol or substance use .  Family history Patient  denies any history of family psychiatric illness.  Education and work Patient has a Naval architect.  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  Medical history Patient has a high cholesterol and headache.  She see Paulita Cradle in [facility].  She is taking cholesterol medication.  She has blood work in July which was normal.      Mental status examination  Patient is casually dressed and fairly groomed. She described her mood is anxious and her affect is constricted.  She's cooperative and maintained fair eye contact.  Her speech is clear and coherent.  Her thought process logical linear and goal-directed.  Her attention and concentration is fair.  She denies any active or passive suicidal thinking and homicidal thinking.  She denies any auditory or visual hallucination.  There were no psychotic symptoms present at this time.  She's alert and oriented x3.  Her insight judgment and impulse control is okay.  Assessment Axis I Major depressive disorder recurrent Axis II deferred Axis III see medical history  Axis IV moderate   Plan  I review her medication and response to the medication.  At this time patient is fairly stable on her current psychiatric medication.  She has job interview and she is hoping that she will get the job .  I will continue her current psychiatric medication.  I recommend to call us if she is any question or concern about the medication if she feels worsening of the symptom.  I also informed that is a possibility that she may see a new psychiatrist on her next followup since I am moving full-time to [place] office.   Greater than 50% of the time spent on counseling and coordination of care.  Portion of this note is generated with voice recognition software and may contain typographical error.

## 2012-02-21 ENCOUNTER — Other Ambulatory Visit (HOSPITAL_COMMUNITY): Payer: Self-pay | Admitting: Psychiatry

## 2012-02-21 ENCOUNTER — Other Ambulatory Visit (HOSPITAL_COMMUNITY): Payer: Self-pay

## 2012-02-21 DIAGNOSIS — F331 Major depressive disorder, recurrent, moderate: Secondary | ICD-10-CM

## 2012-02-21 MED ORDER — SERTRALINE HCL 100 MG PO TABS: 200.0000 mg | ORAL_TABLET | Freq: Every day | ORAL | Status: DC

## 2012-03-05 ENCOUNTER — Other Ambulatory Visit (HOSPITAL_COMMUNITY): Payer: Self-pay | Admitting: Psychiatry

## 2012-03-10 ENCOUNTER — Other Ambulatory Visit (HOSPITAL_COMMUNITY): Payer: Self-pay | Admitting: Psychiatry

## 2012-03-12 ENCOUNTER — Other Ambulatory Visit (HOSPITAL_COMMUNITY): Payer: Self-pay | Admitting: Psychiatry

## 2012-03-12 NOTE — Telephone Encounter (Signed)
Patient has additional refill.

## 2012-03-15 ENCOUNTER — Other Ambulatory Visit (HOSPITAL_COMMUNITY): Payer: Self-pay | Admitting: Psychiatry

## 2012-03-15 ENCOUNTER — Other Ambulatory Visit (HOSPITAL_COMMUNITY): Payer: Self-pay | Admitting: *Deleted

## 2012-03-15 DIAGNOSIS — F331 Major depressive disorder, recurrent, moderate: Secondary | ICD-10-CM

## 2012-03-26 ENCOUNTER — Other Ambulatory Visit (HOSPITAL_COMMUNITY): Payer: Self-pay | Admitting: Psychiatry

## 2012-03-27 ENCOUNTER — Other Ambulatory Visit (HOSPITAL_COMMUNITY): Payer: Self-pay | Admitting: Psychiatry

## 2012-04-12 ENCOUNTER — Ambulatory Visit (HOSPITAL_COMMUNITY): Payer: Self-pay | Admitting: Psychiatry

## 2012-04-27 ENCOUNTER — Other Ambulatory Visit (HOSPITAL_COMMUNITY): Payer: Self-pay | Admitting: Psychiatry

## 2012-04-30 NOTE — Telephone Encounter (Signed)
Phone message completed in the phone message section.  

## 2012-05-05 ENCOUNTER — Other Ambulatory Visit (HOSPITAL_COMMUNITY): Payer: Self-pay | Admitting: Psychiatry

## 2012-05-09 ENCOUNTER — Other Ambulatory Visit (HOSPITAL_COMMUNITY): Payer: Self-pay | Admitting: Psychiatry

## 2012-05-11 ENCOUNTER — Ambulatory Visit (HOSPITAL_COMMUNITY): Payer: Self-pay | Admitting: Psychiatry

## 2012-05-14 ENCOUNTER — Ambulatory Visit (INDEPENDENT_AMBULATORY_CARE_PROVIDER_SITE_OTHER): Payer: Federal, State, Local not specified - PPO | Admitting: Psychiatry

## 2012-05-14 ENCOUNTER — Encounter (HOSPITAL_COMMUNITY): Payer: Self-pay | Admitting: Psychiatry

## 2012-05-14 VITALS — Wt 171.0 lb

## 2012-05-14 DIAGNOSIS — F329 Major depressive disorder, single episode, unspecified: Secondary | ICD-10-CM

## 2012-05-14 DIAGNOSIS — F339 Major depressive disorder, recurrent, unspecified: Secondary | ICD-10-CM

## 2012-05-14 DIAGNOSIS — F331 Major depressive disorder, recurrent, moderate: Secondary | ICD-10-CM

## 2012-05-14 MED ORDER — SERTRALINE HCL 100 MG PO TABS: 200.0000 mg | ORAL_TABLET | Freq: Every day | ORAL | Status: DC

## 2012-05-14 MED ORDER — BUPROPION HCL ER (XL) 300 MG PO TB24: 300.0000 mg | ORAL_TABLET | Freq: Every day | ORAL | Status: DC

## 2012-05-14 MED ORDER — BENZTROPINE MESYLATE 0.5 MG PO TABS: 0.5000 mg | ORAL_TABLET | Freq: Every day | ORAL | Status: DC

## 2012-05-14 MED ORDER — RISPERIDONE 1 MG PO TABS: ORAL_TABLET | ORAL | Status: DC

## 2012-05-14 MED ORDER — GABAPENTIN 100 MG PO CAPS: 100.0000 mg | ORAL_CAPSULE | Freq: Three times a day (TID) | ORAL | Status: DC

## 2012-05-14 NOTE — Patient Instructions (Signed)
Take Neurontin in place of the Klonopin  Try cutting back on the Risperdal.  Fish oil twice a day is good for depression and for the brain to recover from the effects of medications.

## 2012-05-14 NOTE — Progress Notes (Addendum)
Ann Snyder MRN: [id number] DOB: Feb 05, 1951 Date: 05/14/2012 Start Time: 1:02PM  Chief complaint Chief Complaint  Patient presents with  . Depression  . Follow-up  . Establish Care  . Medication Refill   Subjective: "I'm doing poor. I'm just worrying all the time".  History of present illness Patient came for her followup appointment.  Pt reports that she is compliant with the psychotropic medications with poor to fair benefit and perhaps some side effects.  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 She has considerable financial worries with her husband passing 14 years ago, two adult sons living with her who are unable to find jobs either.  I feel that the Risperdal is too high.  Her son tells her that she is forgetful.  Her anxiety might be served with Neurontin.  Current psychiatric medication Klonopin 0.5 mg a day as needed Risperdal 3 mg at bedtime Wellbutrin XL 300 mg daily  Zoloft 200 mg daily  Cogentin 0.5 mg at bedtime  Psychiatric history Patient has been seeing in this office since January 2011.  [facility] treatment.  She was admitted due to worsening of the depression and having intrusive thoughts in her head.  She was unable to function.  In the past patient has been tried Paxil that primary care physician.  She was also admitted in 2007-09-27 at behavioral Center due to overwhelming thoughts.  At that time she was taking care of her elderly mother who was a nursing home.  Patient denies any history of previous suicidal attempt or history of violence.  She has been taking Risperdal since Sep 27, 2007.  She remember feeling depressed since 09/26/1997 when her husband died suddenly due to heart problem.    Psychosocial history Patient was born and raised in [place].  Patient raise her children as a single mother since her husband died.  She has 3  children.  Her older son is married .  Her-2 sons lives with her.  Patient has limited family Support.  She has no brother and sister.  Her parents died .    Alcohol and substance use history Patient denies any history of alcohol or substance use .  Family history Patient  denies any history of family psychiatric illness.  Education and work Patient has a Naval architect.  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  Medical history Patient has a high cholesterol and headache.  She see Paulita Cradle in [facility] [facility].  She is taking cholesterol medication.  She has blood work in July which was normal.      Mental status examination  Patient is casually dressed and fairly groomed. She described her mood is anxious and her affect is constricted.  She's cooperative and maintained fair eye contact.  Her speech is clear and coherent.  Her thought process logical linear and goal-directed.  Her attention and concentration is fair.  She denies any active or passive suicidal thinking and homicidal thinking.  She denies any auditory or visual hallucination.  There were no psychotic symptoms present at this time.  She's alert and oriented x3.  Her insight judgment and impulse control is okay.  Assessment Axis I Major depressive disorder recurrent Axis II deferred Axis III see medical history Axis IV  moderate   Plan  I took her vitals.  I reviewed CC, tobacco/med/surg Hx, meds effects/ side effects, problem list, therapies and responses as well as current situation/symptoms discussed options. See orders and pt instructions for more details.  Orson Aloe, MD, [facility]  End Time: 1:40 PM

## 2012-05-17 ENCOUNTER — Telehealth (HOSPITAL_COMMUNITY): Payer: Self-pay | Admitting: Psychiatry

## 2012-05-17 NOTE — Telephone Encounter (Signed)
Phone message completed in the phone message section.  

## 2012-06-25 ENCOUNTER — Ambulatory Visit (INDEPENDENT_AMBULATORY_CARE_PROVIDER_SITE_OTHER): Payer: Federal, State, Local not specified - PPO | Admitting: Psychiatry

## 2012-06-25 ENCOUNTER — Encounter (HOSPITAL_COMMUNITY): Payer: Self-pay | Admitting: Psychiatry

## 2012-06-25 VITALS — Wt 169.4 lb

## 2012-06-25 DIAGNOSIS — F331 Major depressive disorder, recurrent, moderate: Secondary | ICD-10-CM | POA: Insufficient documentation

## 2012-06-25 DIAGNOSIS — F329 Major depressive disorder, single episode, unspecified: Secondary | ICD-10-CM

## 2012-06-25 DIAGNOSIS — F339 Major depressive disorder, recurrent, unspecified: Secondary | ICD-10-CM

## 2012-06-25 MED ORDER — BENZTROPINE MESYLATE 0.5 MG PO TABS: 0.5000 mg | ORAL_TABLET | Freq: Every day | ORAL | Status: DC

## 2012-06-25 MED ORDER — BUPROPION HCL ER (XL) 450 MG PO TB24: 450.0000 mg | ORAL_TABLET | Freq: Every day | ORAL | Status: DC

## 2012-06-25 MED ORDER — GABAPENTIN 100 MG PO CAPS: 100.0000 mg | ORAL_CAPSULE | Freq: Three times a day (TID) | ORAL | Status: DC

## 2012-06-25 MED ORDER — SERTRALINE HCL 100 MG PO TABS: 250.0000 mg | ORAL_TABLET | Freq: Every day | ORAL | Status: DC

## 2012-06-25 MED ORDER — RISPERIDONE 1 MG PO TABS: ORAL_TABLET | ORAL | Status: DC

## 2012-06-25 NOTE — Patient Instructions (Signed)
Increase Wellbutrin to 1.5 tabs and Zoloft to 2.5 tabs with what you have on hand.  Try using more of the Neurontin for better anxiety control.  Search on the Internet for Temporary Agencies for possible leads for work.  Call if problems or concerns or if need to come in earlier for appointment.
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
## 2012-07-13 ENCOUNTER — Other Ambulatory Visit (HOSPITAL_COMMUNITY): Payer: Self-pay | Admitting: Psychiatry

## 2012-07-16 NOTE — Telephone Encounter (Signed)
Request satisfied via eScripts  

## 2012-09-12 ENCOUNTER — Telehealth: Payer: Self-pay | Admitting: Nurse Practitioner

## 2012-09-12 NOTE — Telephone Encounter (Signed)
appt made

## 2012-09-13 ENCOUNTER — Telehealth: Payer: Self-pay | Admitting: General Practice

## 2012-09-13 ENCOUNTER — Ambulatory Visit (INDEPENDENT_AMBULATORY_CARE_PROVIDER_SITE_OTHER): Payer: BC Managed Care – PPO | Admitting: General Practice

## 2012-09-13 ENCOUNTER — Other Ambulatory Visit: Payer: Self-pay | Admitting: General Practice

## 2012-09-13 ENCOUNTER — Encounter: Payer: Self-pay | Admitting: General Practice

## 2012-09-13 VITALS — BP 148/93 | HR 89 | Temp 97.8°F | Ht 61.0 in | Wt 163.0 lb

## 2012-09-13 DIAGNOSIS — T7840XA Allergy, unspecified, initial encounter: Secondary | ICD-10-CM

## 2012-09-13 DIAGNOSIS — Z09 Encounter for follow-up examination after completed treatment for conditions other than malignant neoplasm: Secondary | ICD-10-CM

## 2012-09-13 DIAGNOSIS — H6691 Otitis media, unspecified, right ear: Secondary | ICD-10-CM

## 2012-09-13 DIAGNOSIS — IMO0002 Reserved for concepts with insufficient information to code with codable children: Secondary | ICD-10-CM

## 2012-09-13 DIAGNOSIS — E785 Hyperlipidemia, unspecified: Secondary | ICD-10-CM

## 2012-09-13 DIAGNOSIS — N63 Unspecified lump in unspecified breast: Secondary | ICD-10-CM

## 2012-09-13 DIAGNOSIS — H6690 Otitis media, unspecified, unspecified ear: Secondary | ICD-10-CM

## 2012-09-13 DIAGNOSIS — M1710 Unilateral primary osteoarthritis, unspecified knee: Secondary | ICD-10-CM

## 2012-09-13 DIAGNOSIS — R928 Other abnormal and inconclusive findings on diagnostic imaging of breast: Secondary | ICD-10-CM

## 2012-09-13 LAB — COMPLETE METABOLIC PANEL WITH GFR
Albumin: 4.4 g/dL (ref 3.5–5.2)
BUN: 13 mg/dL (ref 6–23)
Calcium: 9.6 mg/dL (ref 8.4–10.5)
Chloride: 107 mEq/L (ref 96–112)
GFR, Est African American: >89 mL/min
GFR, Est Non African American: 82 mL/min
Glucose, Bld: 92 mg/dL (ref 70–99)
Potassium: 4.4 mEq/L (ref 3.5–5.3)

## 2012-09-13 LAB — HEPATIC FUNCTION PANEL
ALT: 23 U/L (ref 0–35)
Bilirubin, Direct: 0.1 mg/dL (ref 0.0–0.3)
Indirect Bilirubin: 0.3 mg/dL (ref 0.0–0.9)

## 2012-09-13 LAB — THYROID PANEL WITH TSH
Free Thyroxine Index: 3 (ref 1.0–3.9)
T3 Uptake: 32 % (ref 22.5–37.0)
T4, Total: 9.4 ug/dL (ref 5.0–12.5)

## 2012-09-13 LAB — LIPID PANEL
Cholesterol: 164 mg/dL (ref 0–200)
LDL Cholesterol: 85 mg/dL (ref 0–99)
VLDL: 49 mg/dL — ABNORMAL HIGH (ref 0–40)

## 2012-09-13 MED ORDER — AMOXICILLIN 500 MG PO CAPS: 500.0000 mg | ORAL_CAPSULE | Freq: Two times a day (BID) | ORAL | Status: DC

## 2012-09-13 MED ORDER — METHYLPREDNISOLONE ACETATE 80 MG/ML IJ SUSP
80.0000 mg | Freq: Once | INTRAMUSCULAR | Status: AC
  Administered 2012-09-13: 80 mg via INTRAMUSCULAR

## 2012-09-13 NOTE — Progress Notes (Addendum)
  Subjective:    Patient ID: Ann Snyder, female    DOB: Jul 20, 1950, 62 y.o.   MRN: [id number]  HPI Presents today for chronic health follow up. Hyperlipidemia-reports being out of medications and unable to afford due to being out of work. Reports having insurance currently. Reports eating more baked than fried foods. Also low fat. Denies any type of exercise. Denies muscle cramps or aches. Reports having a generalized itching. Reports changing body washes recently. Reports being joint stiffness in am and it loosens up as day goes on.     Review of Systems  Constitutional: Negative for fever and chills.  HENT: Positive for ear pain. Negative for nosebleeds, congestion, rhinorrhea, neck pain and sinus pressure.   Respiratory: Negative for chest tightness and shortness of breath.   Cardiovascular: Negative for chest pain.  Gastrointestinal: Negative for abdominal pain and blood in stool.  Genitourinary: Negative for vaginal discharge, difficulty urinating and pelvic pain.  Musculoskeletal: Negative for myalgias and back pain.  Skin: Negative.   Neurological: Negative for dizziness and headaches.  Psychiatric/Behavioral: Negative.        Objective:   Physical Exam  Constitutional: She is oriented to person, place, and time. She appears well-developed and well-nourished.  HENT:  Head: Normocephalic and atraumatic.  Right Ear: External ear normal. Tympanic membrane is erythematous.  Left Ear: Tympanic membrane, external ear and ear canal normal.  Mouth/Throat: Oropharynx is clear and moist.  Eyes: Conjunctivae and EOM are normal.  Neck: Normal range of motion.  Cardiovascular: Normal rate, regular rhythm and normal heart sounds.   No murmur heard. Pulmonary/Chest: Effort normal and breath sounds normal. No respiratory distress. She exhibits no tenderness.  Palpable nodule to right lower axillae, non tender,  Size of green pea.  Abdominal: Soft. Bowel sounds are normal. She exhibits  no distension and no mass. There is no tenderness. There is no rebound and no guarding.  Neurological: She is alert and oriented to person, place, and time.  Skin: Skin is warm and dry.  Psychiatric: She has a normal mood and affect.          Assessment & Plan:  Continue antibiotics even if feeling better Take medications as prescribed Diagnostic mammogram referral Avoid irritants Discussed healthy diet and exercise Patient to schedule routine mammogram  RTO if symptoms worsen or unresolved Patient verbalized understanding Coralie Keens, FNP-C

## 2012-09-13 NOTE — Addendum Note (Signed)
Addended byPhilomena Doheny E on: 09/13/2012 11:10 AM   Modules accepted: Orders

## 2012-09-13 NOTE — Telephone Encounter (Signed)
Diagnostic mammogram to be scheduled.

## 2012-09-13 NOTE — Patient Instructions (Addendum)
Hypertriglyceridemia  Diet for High blood levels of Triglycerides Most fats in food are triglycerides. Triglycerides in your blood are stored as fat in your body. High levels of triglycerides in your blood may put you at a greater risk for heart disease and stroke.  Normal triglyceride levels are less than 150 mg/dL. Borderline high levels are 150-199 mg/dl. High levels are 200 - 499 mg/dL, and very high triglyceride levels are greater than 500 mg/dL. The decision to treat high triglycerides is generally based on the level. For people with borderline or high triglyceride levels, treatment includes weight loss and exercise. Drugs are recommended for people with very high triglyceride levels. Many people who need treatment for high triglyceride levels have metabolic syndrome. This syndrome is a collection of disorders that often include: insulin resistance, high blood pressure, blood clotting problems, high cholesterol and triglycerides. TESTING PROCEDURE FOR TRIGLYCERIDES  You should not eat 4 hours before getting your triglycerides measured. The normal range of triglycerides is between 10 and 250 milligrams per deciliter (mg/dl). Some people may have extreme levels (1000 or above), but your triglyceride level may be too high if it is above 150 mg/dl, depending on what other risk factors you have for heart disease.  People with high blood triglycerides may also have high blood cholesterol levels. If you have high blood cholesterol as well as high blood triglycerides, your risk for heart disease is probably greater than if you only had high triglycerides. High blood cholesterol is one of the main risk factors for heart disease. CHANGING YOUR DIET  Your weight can affect your blood triglyceride level. If you are more than 20% above your ideal body weight, you may be able to lower your blood triglycerides by losing weight. Eating less and exercising regularly is the best way to combat this. Fat provides more  calories than any other food. The best way to lose weight is to eat less fat. Only 30% of your total calories should come from fat. Less than 7% of your diet should come from saturated fat. A diet low in fat and saturated fat is the same as a diet to decrease blood cholesterol. By eating a diet lower in fat, you may lose weight, lower your blood cholesterol, and lower your blood triglyceride level.  Eating a diet low in fat, especially saturated fat, may also help you lower your blood triglyceride level. Ask your dietitian to help you figure how much fat you can eat based on the number of calories your caregiver has prescribed for you.  Exercise, in addition to helping with weight loss may also help lower triglyceride levels.   Alcohol can increase blood triglycerides. You may need to stop drinking alcoholic beverages.  Too much carbohydrate in your diet may also increase your blood triglycerides. Some complex carbohydrates are necessary in your diet. These may include bread, rice, potatoes, other starchy vegetables and cereals.  Reduce "simple" carbohydrates. These may include pure sugars, candy, honey, and jelly without losing other nutrients. If you have the kind of high blood triglycerides that is affected by the amount of carbohydrates in your diet, you will need to eat less sugar and less high-sugar foods. Your caregiver can help you with this.  Adding 2-4 grams of fish oil (EPA+ DHA) may also help lower triglycerides. Speak with your caregiver before adding any supplements to your regimen. Following the Diet  Maintain your ideal weight. Your caregivers can help you with a diet. Generally, eating less food and getting more   exercise will help you lose weight. Joining a weight control group may also help. Ask your caregivers for a good weight control group in your area.  Eat low-fat foods instead of high-fat foods. This can help you lose weight too.  These foods are lower in fat. Eat MORE of these:    Dried beans, peas, and lentils.  Egg whites.  Low-fat cottage cheese.  Fish.  Lean cuts of meat, such as round, sirloin, rump, and flank (cut extra fat off meat you fix).  Whole grain breads, cereals and pasta.  Skim and nonfat dry milk.  Low-fat yogurt.  Poultry without the skin.  Cheese made with skim or part-skim milk, such as mozzarella, parmesan, farmers', ricotta, or pot cheese. These are higher fat foods. Eat LESS of these:   Whole milk and foods made from whole milk, such as American, blue, cheddar, monterey jack, and swiss cheese  High-fat meats, such as luncheon meats, sausages, knockwurst, bratwurst, hot dogs, ribs, corned beef, ground pork, and regular ground beef.  Fried foods. Limit saturated fats in your diet. Substituting unsaturated fat for saturated fat may decrease your blood triglyceride level. You will need to read package labels to know which products contain saturated fats.  These foods are high in saturated fat. Eat LESS of these:   Fried pork skins.  Whole milk.  Skin and fat from poultry.  Palm oil.  Butter.  Shortening.  Cream cheese.  Bacon.  Margarines and baked goods made from listed oils.  Vegetable shortenings.  Chitterlings.  Fat from meats.  Coconut oil.  Palm kernel oil.  Lard.  Cream.  Sour cream.  Fatback.  Coffee whiteners and non-dairy creamers made with these oils.  Cheese made from whole milk. Use unsaturated fats (both polyunsaturated and monounsaturated) moderately. Remember, even though unsaturated fats are better than saturated fats; you still want a diet low in total fat.  These foods are high in unsaturated fat:   Canola oil.  Sunflower oil.  Mayonnaise.  Almonds.  Peanuts.  Pine nuts.  Margarines made with these oils.  Safflower oil.  Olive oil.  Avocados.  Cashews.  Peanut butter.  Sunflower seeds.  Soybean oil.  Peanut  oil.  Olives.  Pecans.  Walnuts.  Pumpkin seeds. Avoid sugar and other high-sugar foods. This will decrease carbohydrates without decreasing other nutrients. Sugar in your food goes rapidly to your blood. When there is excess sugar in your blood, your liver may use it to make more triglycerides. Sugar also contains calories without other important nutrients.  Eat LESS of these:   Sugar, brown sugar, powdered sugar, jam, jelly, preserves, honey, syrup, molasses, pies, candy, cakes, cookies, frosting, pastries, colas, soft drinks, punches, fruit drinks, and regular gelatin.  Avoid alcohol. Alcohol, even more than sugar, may increase blood triglycerides. In addition, alcohol is high in calories and low in nutrients. Ask for sparkling water, or a diet soft drink instead of an alcoholic beverage. Suggestions for planning and preparing meals   Bake, broil, grill or roast meats instead of frying.  Remove fat from meats and skin from poultry before cooking.  Add spices, herbs, lemon juice or vinegar to vegetables instead of salt, rich sauces or gravies.  Use a non-stick skillet without fat or use no-stick sprays.  Cool and refrigerate stews and broth. Then remove the hardened fat floating on the surface before serving.  Refrigerate meat drippings and skim off fat to make low-fat gravies.  Serve more fish.  Use less butter,   margarine and other high-fat spreads on bread or vegetables.  Use skim or reconstituted non-fat dry milk for cooking.  Cook with low-fat cheeses.  Substitute low-fat yogurt or cottage cheese for all or part of the sour cream in recipes for sauces, dips or congealed salads.  Use half yogurt/half mayonnaise in salad recipes.  Substitute evaporated skim milk for cream. Evaporated skim milk or reconstituted non-fat dry milk can be whipped and substituted for whipped cream in certain recipes.  Choose fresh fruits for dessert instead of high-fat foods such as pies or  cakes. Fruits are naturally low in fat. When Dining Out   Order low-fat appetizers such as fruit or vegetable juice, pasta with vegetables or tomato sauce.  Select clear, rather than cream soups.  Ask that dressings and gravies be served on the side. Then use less of them.  Order foods that are baked, broiled, poached, steamed, stir-fried, or roasted.  Ask for margarine instead of butter, and use only a small amount.  Drink sparkling water, unsweetened tea or coffee, or diet soft drinks instead of alcohol or other sweet beverages. QUESTIONS AND ANSWERS ABOUT OTHER FATS IN THE BLOOD: SATURATED FAT, TRANS FAT, AND CHOLESTEROL What is trans fat? Trans fat is a type of fat that is formed when vegetable oil is hardened through a process called hydrogenation. This process helps makes foods more solid, gives them shape, and prolongs their shelf life. Trans fats are also called hydrogenated or partially hydrogenated oils.  What do saturated fat, trans fat, and cholesterol in foods have to do with heart disease? Saturated fat, trans fat, and cholesterol in the diet all raise the level of LDL "bad" cholesterol in the blood. The higher the LDL cholesterol, the greater the risk for coronary heart disease (CHD). Saturated fat and trans fat raise LDL similarly.  What foods contain saturated fat, trans fat, and cholesterol? High amounts of saturated fat are found in animal products, such as fatty cuts of meat, chicken skin, and full-fat dairy products like butter, whole milk, cream, and cheese, and in tropical vegetable oils such as palm, palm kernel, and coconut oil. Trans fat is found in some of the same foods as saturated fat, such as vegetable shortening, some margarines (especially hard or stick margarine), crackers, cookies, baked goods, fried foods, salad dressings, and other processed foods made with partially hydrogenated vegetable oils. Small amounts of trans fat also occur naturally in some animal  products, such as milk products, beef, and lamb. Foods high in cholesterol include liver, other organ meats, egg yolks, shrimp, and full-fat dairy products. How can I use the new food label to make heart-healthy food choices? Check the Nutrition Facts panel of the food label. Choose foods lower in saturated fat, trans fat, and cholesterol. For saturated fat and cholesterol, you can also use the Percent Daily Value (%DV): 5% DV or less is low, and 20% DV or more is high. (There is no %DV for trans fat.) Use the Nutrition Facts panel to choose foods low in saturated fat and cholesterol, and if the trans fat is not listed, read the ingredients and limit products that list shortening or hydrogenated or partially hydrogenated vegetable oil, which tend to be high in trans fat. POINTS TO REMEMBER:   Discuss your risk for heart disease with your caregivers, and take steps to reduce risk factors.  Change your diet. Choose foods that are low in saturated fat, trans fat, and cholesterol.  Add exercise to your daily routine if   it is not already being done. Participate in physical activity of moderate intensity, like brisk walking, for at least 30 minutes on most, and preferably all days of the week. No time? Break the 30 minutes into three, 10-minute segments during the day.  Stop smoking. If you do smoke, contact your caregiver to discuss ways in which they can help you quit.  Do not use street drugs.  Maintain a normal weight.  Maintain a healthy blood pressure.  Keep up with your blood work for checking the fats in your blood as directed by your caregiver. Document Released: 02/11/2004 Document Revised: 10/25/2011 Document Reviewed: 09/08/2008 Exit[employer] Patient Information 2013 [employer].  

## 2012-09-13 NOTE — Telephone Encounter (Signed)
Please advise 

## 2012-09-14 ENCOUNTER — Other Ambulatory Visit: Payer: Self-pay | Admitting: Family Medicine

## 2012-09-14 ENCOUNTER — Telehealth: Payer: Self-pay

## 2012-09-14 ENCOUNTER — Other Ambulatory Visit: Payer: Self-pay | Admitting: General Practice

## 2012-09-14 DIAGNOSIS — L259 Unspecified contact dermatitis, unspecified cause: Secondary | ICD-10-CM

## 2012-09-14 DIAGNOSIS — N63 Unspecified lump in unspecified breast: Secondary | ICD-10-CM

## 2012-09-14 DIAGNOSIS — E785 Hyperlipidemia, unspecified: Secondary | ICD-10-CM

## 2012-09-14 MED ORDER — ROSUVASTATIN CALCIUM 20 MG PO TABS: 20.0000 mg | ORAL_TABLET | Freq: Every day | ORAL | Status: DC

## 2012-09-14 MED ORDER — PREDNISONE (PAK) 10 MG PO TABS: ORAL_TABLET | ORAL | Status: DC

## 2012-09-14 NOTE — Telephone Encounter (Signed)
Informed patient of lab results and discussed importance taking medications as prescribed. Increased crestor. Also healthy eating and exercise. Patient verbalized understanding and in agreement.

## 2012-09-14 NOTE — Telephone Encounter (Signed)
You offered a time released anti itching pill yesterday but I declined  Can I have it now called into [facility]  Still itching

## 2012-09-14 NOTE — Progress Notes (Signed)
Patient aware.

## 2012-09-21 ENCOUNTER — Other Ambulatory Visit: Payer: Self-pay | Admitting: General Practice

## 2012-09-21 ENCOUNTER — Ambulatory Visit (HOSPITAL_COMMUNITY): Payer: Self-pay | Admitting: Psychiatry

## 2012-09-21 ENCOUNTER — Telehealth: Payer: Self-pay | Admitting: *Deleted

## 2012-09-21 DIAGNOSIS — E785 Hyperlipidemia, unspecified: Secondary | ICD-10-CM

## 2012-09-21 MED ORDER — ATORVASTATIN CALCIUM 20 MG PO TABS: 20.0000 mg | ORAL_TABLET | Freq: Every day | ORAL | Status: DC

## 2012-09-21 NOTE — Telephone Encounter (Signed)
Could you please try one of the generic statins ex.atorvastatin, fluvastatin, lovastatin, pravastatin or simvastatin, because ins won't cover crestor until at least one of the generics are tried. Thanks

## 2012-09-21 NOTE — Telephone Encounter (Signed)
Sent script to pharmacy. thx

## 2012-09-21 NOTE — Telephone Encounter (Signed)
Pt aware that there will be a change in cholesterol pill next time she picks it up

## 2012-09-24 ENCOUNTER — Ambulatory Visit (INDEPENDENT_AMBULATORY_CARE_PROVIDER_SITE_OTHER): Payer: Federal, State, Local not specified - PPO | Admitting: Psychiatry

## 2012-09-24 ENCOUNTER — Encounter (HOSPITAL_COMMUNITY): Payer: Self-pay | Admitting: Psychiatry

## 2012-09-24 VITALS — BP 132/86 | Ht 61.5 in | Wt 163.6 lb

## 2012-09-24 DIAGNOSIS — F331 Major depressive disorder, recurrent, moderate: Secondary | ICD-10-CM

## 2012-09-24 DIAGNOSIS — F339 Major depressive disorder, recurrent, unspecified: Secondary | ICD-10-CM

## 2012-09-24 DIAGNOSIS — F418 Other specified anxiety disorders: Secondary | ICD-10-CM

## 2012-09-24 MED ORDER — RISPERIDONE 3 MG PO TABS: ORAL_TABLET | ORAL | Status: DC

## 2012-09-24 NOTE — Addendum Note (Signed)
Addended by: Mike Craze on: 09/24/2012 03:04 PM   Modules accepted: Orders

## 2012-09-24 NOTE — Progress Notes (Signed)
[facility] [address] Progress Note Ann Snyder MRN: [id number] DOB: Aug 30, 1950 Age: 62 y.o.  Date: 09/24/2012 Start Time: 2:43 PM End Time: 3:04 PM  Chief Complaint: Chief Complaint  Patient presents with  . Depression  . Follow-up  . Medication Refill   Subjective: "I've gotten more affordable insurance that covers a lot more.  My mother-in-law passed away in 09/17/22.  I actually haven't been able to afford any of the psych meds for more than a couple of months". Depression 5/10 and Anxiety 7/10, where 1 is the best and 10 is the worst.  Pain 4/10 involving the neck and achy all over.  History of present illness Patient came for her followup appointment.  Pt reports that she has not been compliant with the psychotropic medications because she does not have any money for the copay for her meds.  Her mother-in-law passed in Sep 17, 2022.  She notes that now she lays awake and thinks about everything at night.  She requests to be on something that would help her sleep better at night.  Discussed her going to [facility] for the most affordable Risperdal for her to start back for her sleep.  Current psychiatric medication None,   But had been on  Klonopin 0.5 mg a day as needed Risperdal 0.5 mg once during the day and 3 mg at bedtime Wellbutrin XL 300 mg daily  Zoloft 200 mg daily  Cogentin 0.5 mg at bedtime Neurontin 100 mg once a day  Psychiatric history Patient has been seeing in this office since January 2011.  She was [facility] from [facility] after inpatient treatment.  She was admitted due to worsening of the depression and having intrusive thoughts in her head.  She was unable to function.  In the past patient has been tried Paxil that primary care physician.  She was also admitted in 2007/10/17 at behavioral Center due to overwhelming thoughts.  At that time she was taking care of her elderly mother who was a nursing home.  Patient denies any history of previous  suicidal attempt or history of violence.  She has been taking Risperdal since October 17, 2007.  She remember feeling depressed since 1997-10-16 when her husband died suddenly due to heart problem.    Psychosocial history Patient was born and raised in [place].  Patient raise her children as a single mother since her husband died.  She has 3 children.  Her older son is married .  Her-2 sons lives with her.  Patient has limited family Support.  She has no brother and sister.  Her parents died .    Alcohol and substance use history Patient denies any history of alcohol or substance use .  Allergies: No Known Allergies  Medical History: Past Medical History  Diagnosis Date  . Anxiety   . Depression   . Headache   . High cholesterol   Patient has a high cholesterol and headache.  She see Paulita Cradle in [facility].  She is taking a new cholesterol medication.  She has blood work in July which was normal.      Surgical History: Past Surgical History  Procedure Laterality Date  . Cesarean section     Family History: family history includes Anxiety disorder in her father.  There is no history of ADD / ADHD, and Alcohol abuse, and Drug abuse, and Bipolar disorder, and Depression, and Dementia, and OCD, and Paranoid behavior, and Schizophrenia, and Seizures, and Physical abuse, and Sexual abuse, .  Reviewed all the above with pt during today's visit.  Education and work Patient has a Naval architect.  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  Lab Results:  Results for orders placed in visit on 09/13/12 (from the past 8736 hour(s))  LIPID PANEL   Collection Time    09/13/12  9:37 AM      Result Value Range   Cholesterol 164  0 - 200 mg/dL   Triglycerides 098 (*) <150 mg/dL   HDL 30 (*) >11 mg/dL   Total CHOL/HDL Ratio 5.5     VLDL 49 (*) 0 - 40 mg/dL   LDL Cholesterol 85  0 - 99 mg/dL   HEPATIC FUNCTION PANEL   Collection Time    09/13/12  9:37 AM      Result Value Range   Total Bilirubin 0.4  0.3 - 1.2 mg/dL   Bilirubin, Direct 0.1  0.0 - 0.3 mg/dL   Indirect Bilirubin 0.3  0.0 - 0.9 mg/dL   Alkaline Phosphatase 83  39 - 117 U/L   AST 17  0 - 37 U/L   ALT 23  0 - 35 U/L   Total Protein 6.8  6.0 - 8.3 g/dL   Albumin 4.4  3.5 - 5.2 g/dL  COMPLETE METABOLIC PANEL WITH GFR   Collection Time    09/13/12  9:37 AM      Result Value Range   Sodium 141  135 - 145 mEq/L   Potassium 4.4  3.5 - 5.3 mEq/L   Chloride 107  96 - 112 mEq/L   CO2 23  19 - 32 mEq/L   Glucose, Bld 92  70 - 99 mg/dL   BUN 13  6 - 23 mg/dL   Creat 9.14  7.82 - 9.56 mg/dL   Total Bilirubin 0.4  0.3 - 1.2 mg/dL   Alkaline Phosphatase 83  39 - 117 U/L   AST 17  0 - 37 U/L   ALT 23  0 - 35 U/L   Total Protein 6.8  6.0 - 8.3 g/dL   Albumin 4.4  3.5 - 5.2 g/dL   Calcium 9.6  8.4 - 21.3 mg/dL   GFR, Est African American >89     GFR, Est Non African American 82    THYROID PANEL WITH TSH   Collection Time    09/13/12  9:37 AM      Result Value Range   T4, Total 9.4  5.0 - 12.5 ug/dL   T3 Uptake 08.6  57.8 - 37.0 %   Free Thyroxine Index 3.0  1.0 - 3.9   TSH 3.196  0.350 - 4.500 uIU/mL  Results for orders placed in visit on 11/08/11 (from the past 8736 hour(s))  COMPREHENSIVE METABOLIC PANEL   Collection Time    11/08/11  9:50 AM      Result Value Range   Sodium 140  135 - 145 mEq/L   Potassium 4.1  3.5 - 5.3 mEq/L   Chloride 104  96 - 112 mEq/L   CO2 27  19 - 32 mEq/L   Glucose, Bld 87  70 - 99 mg/dL   BUN 18  6 - 23 mg/dL   Creat 4.69  6.29 - 5.28 mg/dL   Total Bilirubin 0.3  0.3 - 1.2 mg/dL   Alkaline Phosphatase 57  39 - 117 U/L   AST 15  0 - 37  U/L   ALT 20  0 - 35 U/L   Total Protein 6.8  6.0 - 8.3 g/dL   Albumin 4.3  3.5 - 5.2 g/dL   Calcium 9.2  8.4 - 45.4 mg/dL  CBC WITH DIFFERENTIAL   Collection Time    11/08/11  9:50 AM      Result Value Range   WBC 5.8  4.0 - 10.5 K/uL    RBC 4.41  3.87 - 5.11 MIL/uL   Hemoglobin 12.5  12.0 - 15.0 g/dL   HCT 09.8  11.9 - 14.7 %   MCV 82.1  78.0 - 100.0 fL   MCH 28.3  26.0 - 34.0 pg   MCHC 34.5  30.0 - 36.0 g/dL   RDW 82.9  56.2 - 13.0 %   Platelets 234  150 - 400 K/uL   Neutrophils Relative % 60  43 - 77 %   Neutro Abs 3.5  1.7 - 7.7 K/uL   Lymphocytes Relative 26  12 - 46 %   Lymphs Abs 1.5  0.7 - 4.0 K/uL   Monocytes Relative 10  3 - 12 %   Monocytes Absolute 0.6  0.1 - 1.0 K/uL   Eosinophils Relative 3  0 - 5 %   Eosinophils Absolute 0.2  0.0 - 0.7 K/uL   Basophils Relative 1  0 - 1 %   Basophils Absolute 0.0  0.0 - 0.1 K/uL   Smear Review Criteria for review not met    HEMOGLOBIN A1C   Collection Time    11/08/11  9:50 AM      Result Value Range   Hemoglobin A1C 5.4  <5.7 %   Mean Plasma Glucose 108  <117 mg/dL   Mental status examination  Patient is casually dressed and fairly groomed. She described her mood is anxious and her affect is constricted.  She's cooperative and maintained fair eye contact.  Her speech is clear and coherent.  Her thought process logical linear and goal-directed.  Her attention and concentration is fair.  She denies any active or passive suicidal thinking and homicidal thinking.  She denies any auditory or visual hallucination.  There were no psychotic symptoms present at this time.  She's alert and oriented x3.  Her insight judgment and impulse control is okay.  Assessment Axis I Major depressive disorder recurrent Axis II deferred Axis III see medical history Axis IV moderate   Plan: I took her vitals.  I reviewed CC, tobacco/med/surg Hx, meds effects/ side effects, problem list, therapies and responses as well as current situation/symptoms discussed options. Add back bedtime Risperdal and have her find the most affordable place to get it filled. See orders and pt instructions for more details. MEDICATIONS this encounter: Meds ordered this encounter  Medications  .  risperiDONE (RISPERDAL) 3 MG tablet    Sig: Take by mouth 1/2 during the day once or twice and 1 and 1/2 at night.  This may slow down motor and mental activity.    Dispense:  30 tablet    Refill:  2    May fill as 90 day supply or three 30 day supplies pt's choice.   Medical Decision Making Problem Points:  Established problem, stable/improving (1), Established problem, worsening (2), Review of last therapy session (1) and Review of psycho-social stressors (1) Data Points:  Review or order clinical lab tests (1) Review of medication regiment & side effects (2)  I certify that outpatient services furnished can reasonably be expected to improve the  patient's condition.   Orson Aloe, MD, [facility]

## 2012-09-24 NOTE — Patient Instructions (Addendum)
Could use "Move Free" or "Osteo bi Flex" for arthritic pain.   The important ingredients are Chondrotin Sulfate and Glucosamine.  Tumeric is also helpful for arthritis.   Krill oil and cod liver oil may be helpful for arthritis.   MegaChaga contains Oregeno and Chaga and seems to be very helpful  [employer] is a great source for all of these.  [phone]  Ann Snyder is a mushroom that has the strongest antiinflammatory properties of any substance known to mankind.  Among other sources, it can be ordered from [email or web address]  Take care of yourself.  No one else is standing up to do the job and only you know what you need.   GET SERIOUS about taking care of yourself.  Do the next right thing and that often means doing something to care for yourself along the lines of are you hungry, are you angry, are you lonely, are you tired, are you scared?  [employer] is what that stands for.  Call if problems or concerns.

## 2012-09-26 ENCOUNTER — Other Ambulatory Visit: Payer: Self-pay

## 2012-09-27 ENCOUNTER — Telehealth (HOSPITAL_COMMUNITY): Payer: Self-pay | Admitting: Psychiatry

## 2012-09-27 MED ORDER — GABAPENTIN 300 MG PO CAPS: 300.0000 mg | ORAL_CAPSULE | Freq: Every day | ORAL | Status: DC

## 2012-09-27 NOTE — Telephone Encounter (Signed)
Pt on Klonopin and Neurontin.  Not sleeping on Risperdal at HS.  Will give 300 mg Neurtontin for sleep.

## 2012-10-09 ENCOUNTER — Other Ambulatory Visit: Payer: Self-pay

## 2012-10-16 ENCOUNTER — Ambulatory Visit
Admission: RE | Admit: 2012-10-16 | Discharge: 2012-10-16 | Disposition: A | Discharge status: Home / Self Care | Payer: BC Managed Care – PPO | Source: Ambulatory Visit | Attending: Family Medicine | Admitting: Family Medicine

## 2012-10-16 DIAGNOSIS — N63 Unspecified lump in unspecified breast: Secondary | ICD-10-CM

## 2012-12-06 ENCOUNTER — Encounter: Payer: Self-pay | Admitting: *Deleted

## 2012-12-13 ENCOUNTER — Encounter: Payer: Self-pay | Admitting: General Practice

## 2012-12-13 ENCOUNTER — Ambulatory Visit (INDEPENDENT_AMBULATORY_CARE_PROVIDER_SITE_OTHER): Payer: BC Managed Care – PPO | Admitting: General Practice

## 2012-12-13 VITALS — BP 147/90 | HR 86 | Temp 98.4°F | Ht 61.5 in | Wt 156.0 lb

## 2012-12-13 DIAGNOSIS — Z833 Family history of diabetes mellitus: Secondary | ICD-10-CM

## 2012-12-13 DIAGNOSIS — F32A Depression, unspecified: Secondary | ICD-10-CM

## 2012-12-13 DIAGNOSIS — F3289 Other specified depressive episodes: Secondary | ICD-10-CM

## 2012-12-13 DIAGNOSIS — F329 Major depressive disorder, single episode, unspecified: Secondary | ICD-10-CM

## 2012-12-13 DIAGNOSIS — E785 Hyperlipidemia, unspecified: Secondary | ICD-10-CM

## 2012-12-13 LAB — POCT CBC
Granulocyte percent: 69.5 %G (ref 37–80)
HCT, POC: 38.8 % (ref 37.7–47.9)
Hemoglobin: 13.1 g/dL (ref 12.2–16.2)
Lymph, poc: 2 (ref 0.6–3.4)
POC Granulocyte: 4.8 (ref 2–6.9)
RBC: 4.6 M/uL (ref 4.04–5.48)

## 2012-12-13 LAB — POCT GLYCOSYLATED HEMOGLOBIN (HGB A1C): Hemoglobin A1C: 5.3

## 2012-12-13 MED ORDER — ATORVASTATIN CALCIUM 20 MG PO TABS: 20.0000 mg | ORAL_TABLET | Freq: Every day | ORAL | Status: DC

## 2012-12-13 NOTE — Progress Notes (Signed)
  Subjective:    Patient ID: Ann Snyder, female    DOB: 14-Jun-1950, 62 y.o.   MRN: [id number]  HPI Patient presents today for 3 month follow up. She has a history of depression and hyperlipidemia. She reports taking medication as directed. Reports zoloft is effective in managing her mood swings. Reports being seen by a psychiatrist and depression medication being managed by that physician. She reports trying to eat a healthy diet (low fat, baked, whole wheat) and denies regular exercise.     Review of Systems  Constitutional: Negative for fever and chills.  HENT: Negative for neck pain and neck stiffness.   Respiratory: Negative for chest tightness and shortness of breath.   Cardiovascular: Negative for chest pain and palpitations.  Gastrointestinal: Negative for nausea, vomiting, abdominal pain, diarrhea and blood in stool.  Genitourinary: Negative for hematuria and difficulty urinating.  Neurological: Negative for dizziness, weakness and headaches.       Objective:   Physical Exam  Constitutional: She is oriented to person, place, and time. She appears well-developed and well-nourished.  HENT:  Head: Normocephalic and atraumatic.  Right Ear: External ear normal.  Left Ear: External ear normal.  Nose: Nose normal.  Mouth/Throat: Oropharynx is clear and moist.  Eyes: EOM are normal. Pupils are equal, round, and reactive to light.  Neck: Normal range of motion. Neck supple. No thyromegaly present.  Cardiovascular: Normal rate, regular rhythm and normal heart sounds.   No murmur heard. Pulmonary/Chest: Effort normal and breath sounds normal. No respiratory distress. She exhibits no tenderness.  Abdominal: Soft. Bowel sounds are normal. She exhibits no distension. There is no tenderness.  Musculoskeletal: She exhibits no edema and no tenderness.  Lymphadenopathy:    She has no cervical adenopathy.  Neurological: She is alert and oriented to person, place, and time.  Skin: Skin  is warm and dry. No rash noted.  Psychiatric: She has a normal mood and affect.          Assessment & Plan:  1. Depression  2. Other and unspecified hyperlipidemia - POCT CBC - CMP14+EGFR - NMR, lipoprofile - atorvastatin (LIPITOR) 20 MG tablet; Take 1 tablet (20 mg total) by mouth daily.  Dispense: 90 tablet; Refill: 0  3. Family history of diabetes mellitus - POCT glycosylated hemoglobin (Hb A1C) -Continue all current medications Labs pending F/u in 3 months Discussed exercise and healthy diet Patient verbalized understanding Coralie Keens, FNP-C

## 2012-12-13 NOTE — Patient Instructions (Addendum)
Hypertriglyceridemia  Diet for High blood levels of Triglycerides Most fats in food are triglycerides. Triglycerides in your blood are stored as fat in your body. High levels of triglycerides in your blood may put you at a greater risk for heart disease and stroke.  Normal triglyceride levels are less than 150 mg/dL. Borderline high levels are 150-199 mg/dl. High levels are 200 - 499 mg/dL, and very high triglyceride levels are greater than 500 mg/dL. The decision to treat high triglycerides is generally based on the level. For people with borderline or high triglyceride levels, treatment includes weight loss and exercise. Drugs are recommended for people with very high triglyceride levels. Many people who need treatment for high triglyceride levels have metabolic syndrome. This syndrome is a collection of disorders that often include: insulin resistance, high blood pressure, blood clotting problems, high cholesterol and triglycerides. TESTING PROCEDURE FOR TRIGLYCERIDES  You should not eat 4 hours before getting your triglycerides measured. The normal range of triglycerides is between 10 and 250 milligrams per deciliter (mg/dl). Some people may have extreme levels (1000 or above), but your triglyceride level may be too high if it is above 150 mg/dl, depending on what other risk factors you have for heart disease.  People with high blood triglycerides may also have high blood cholesterol levels. If you have high blood cholesterol as well as high blood triglycerides, your risk for heart disease is probably greater than if you only had high triglycerides. High blood cholesterol is one of the main risk factors for heart disease. CHANGING YOUR DIET  Your weight can affect your blood triglyceride level. If you are more than 20% above your ideal body weight, you may be able to lower your blood triglycerides by losing weight. Eating less and exercising regularly is the best way to combat this. Fat provides more  calories than any other food. The best way to lose weight is to eat less fat. Only 30% of your total calories should come from fat. Less than 7% of your diet should come from saturated fat. A diet low in fat and saturated fat is the same as a diet to decrease blood cholesterol. By eating a diet lower in fat, you may lose weight, lower your blood cholesterol, and lower your blood triglyceride level.  Eating a diet low in fat, especially saturated fat, may also help you lower your blood triglyceride level. Ask your dietitian to help you figure how much fat you can eat based on the number of calories your caregiver has prescribed for you.  Exercise, in addition to helping with weight loss may also help lower triglyceride levels.   Alcohol can increase blood triglycerides. You may need to stop drinking alcoholic beverages.  Too much carbohydrate in your diet may also increase your blood triglycerides. Some complex carbohydrates are necessary in your diet. These may include bread, rice, potatoes, other starchy vegetables and cereals.  Reduce "simple" carbohydrates. These may include pure sugars, candy, honey, and jelly without losing other nutrients. If you have the kind of high blood triglycerides that is affected by the amount of carbohydrates in your diet, you will need to eat less sugar and less high-sugar foods. Your caregiver can help you with this.  Adding 2-4 grams of fish oil (EPA+ DHA) may also help lower triglycerides. Speak with your caregiver before adding any supplements to your regimen. Following the Diet  Maintain your ideal weight. Your caregivers can help you with a diet. Generally, eating less food and getting more   exercise will help you lose weight. Joining a weight control group may also help. Ask your caregivers for a good weight control group in your area.  Eat low-fat foods instead of high-fat foods. This can help you lose weight too.  These foods are lower in fat. Eat MORE of these:    Dried beans, peas, and lentils.  Egg whites.  Low-fat cottage cheese.  Fish.  Lean cuts of meat, such as round, sirloin, rump, and flank (cut extra fat off meat you fix).  Whole grain breads, cereals and pasta.  Skim and nonfat dry milk.  Low-fat yogurt.  Poultry without the skin.  Cheese made with skim or part-skim milk, such as mozzarella, parmesan, farmers', ricotta, or pot cheese. These are higher fat foods. Eat LESS of these:   Whole milk and foods made from whole milk, such as American, blue, cheddar, monterey jack, and swiss cheese  High-fat meats, such as luncheon meats, sausages, knockwurst, bratwurst, hot dogs, ribs, corned beef, ground pork, and regular ground beef.  Fried foods. Limit saturated fats in your diet. Substituting unsaturated fat for saturated fat may decrease your blood triglyceride level. You will need to read package labels to know which products contain saturated fats.  These foods are high in saturated fat. Eat LESS of these:   Fried pork skins.  Whole milk.  Skin and fat from poultry.  Palm oil.  Butter.  Shortening.  Cream cheese.  Bacon.  Margarines and baked goods made from listed oils.  Vegetable shortenings.  Chitterlings.  Fat from meats.  Coconut oil.  Palm kernel oil.  Lard.  Cream.  Sour cream.  Fatback.  Coffee whiteners and non-dairy creamers made with these oils.  Cheese made from whole milk. Use unsaturated fats (both polyunsaturated and monounsaturated) moderately. Remember, even though unsaturated fats are better than saturated fats; you still want a diet low in total fat.  These foods are high in unsaturated fat:   Canola oil.  Sunflower oil.  Mayonnaise.  Almonds.  Peanuts.  Pine nuts.  Margarines made with these oils.  Safflower oil.  Olive oil.  Avocados.  Cashews.  Peanut butter.  Sunflower seeds.  Soybean oil.  Peanut  oil.  Olives.  Pecans.  Walnuts.  Pumpkin seeds. Avoid sugar and other high-sugar foods. This will decrease carbohydrates without decreasing other nutrients. Sugar in your food goes rapidly to your blood. When there is excess sugar in your blood, your liver may use it to make more triglycerides. Sugar also contains calories without other important nutrients.  Eat LESS of these:   Sugar, brown sugar, powdered sugar, jam, jelly, preserves, honey, syrup, molasses, pies, candy, cakes, cookies, frosting, pastries, colas, soft drinks, punches, fruit drinks, and regular gelatin.  Avoid alcohol. Alcohol, even more than sugar, may increase blood triglycerides. In addition, alcohol is high in calories and low in nutrients. Ask for sparkling water, or a diet soft drink instead of an alcoholic beverage. Suggestions for planning and preparing meals   Bake, broil, grill or roast meats instead of frying.  Remove fat from meats and skin from poultry before cooking.  Add spices, herbs, lemon juice or vinegar to vegetables instead of salt, rich sauces or gravies.  Use a non-stick skillet without fat or use no-stick sprays.  Cool and refrigerate stews and broth. Then remove the hardened fat floating on the surface before serving.  Refrigerate meat drippings and skim off fat to make low-fat gravies.  Serve more fish.  Use less butter,   margarine and other high-fat spreads on bread or vegetables.  Use skim or reconstituted non-fat dry milk for cooking.  Cook with low-fat cheeses.  Substitute low-fat yogurt or cottage cheese for all or part of the sour cream in recipes for sauces, dips or congealed salads.  Use half yogurt/half mayonnaise in salad recipes.  Substitute evaporated skim milk for cream. Evaporated skim milk or reconstituted non-fat dry milk can be whipped and substituted for whipped cream in certain recipes.  Choose fresh fruits for dessert instead of high-fat foods such as pies or  cakes. Fruits are naturally low in fat. When Dining Out   Order low-fat appetizers such as fruit or vegetable juice, pasta with vegetables or tomato sauce.  Select clear, rather than cream soups.  Ask that dressings and gravies be served on the side. Then use less of them.  Order foods that are baked, broiled, poached, steamed, stir-fried, or roasted.  Ask for margarine instead of butter, and use only a small amount.  Drink sparkling water, unsweetened tea or coffee, or diet soft drinks instead of alcohol or other sweet beverages. QUESTIONS AND ANSWERS ABOUT OTHER FATS IN THE BLOOD: SATURATED FAT, TRANS FAT, AND CHOLESTEROL What is trans fat? Trans fat is a type of fat that is formed when vegetable oil is hardened through a process called hydrogenation. This process helps makes foods more solid, gives them shape, and prolongs their shelf life. Trans fats are also called hydrogenated or partially hydrogenated oils.  What do saturated fat, trans fat, and cholesterol in foods have to do with heart disease? Saturated fat, trans fat, and cholesterol in the diet all raise the level of LDL "bad" cholesterol in the blood. The higher the LDL cholesterol, the greater the risk for coronary heart disease (CHD). Saturated fat and trans fat raise LDL similarly.  What foods contain saturated fat, trans fat, and cholesterol? High amounts of saturated fat are found in animal products, such as fatty cuts of meat, chicken skin, and full-fat dairy products like butter, whole milk, cream, and cheese, and in tropical vegetable oils such as palm, palm kernel, and coconut oil. Trans fat is found in some of the same foods as saturated fat, such as vegetable shortening, some margarines (especially hard or stick margarine), crackers, cookies, baked goods, fried foods, salad dressings, and other processed foods made with partially hydrogenated vegetable oils. Small amounts of trans fat also occur naturally in some animal  products, such as milk products, beef, and lamb. Foods high in cholesterol include liver, other organ meats, egg yolks, shrimp, and full-fat dairy products. How can I use the new food label to make heart-healthy food choices? Check the Nutrition Facts panel of the food label. Choose foods lower in saturated fat, trans fat, and cholesterol. For saturated fat and cholesterol, you can also use the Percent Daily Value (%DV): 5% DV or less is low, and 20% DV or more is high. (There is no %DV for trans fat.) Use the Nutrition Facts panel to choose foods low in saturated fat and cholesterol, and if the trans fat is not listed, read the ingredients and limit products that list shortening or hydrogenated or partially hydrogenated vegetable oil, which tend to be high in trans fat. POINTS TO REMEMBER:   Discuss your risk for heart disease with your caregivers, and take steps to reduce risk factors.  Change your diet. Choose foods that are low in saturated fat, trans fat, and cholesterol.  Add exercise to your daily routine if   it is not already being done. Participate in physical activity of moderate intensity, like brisk walking, for at least 30 minutes on most, and preferably all days of the week. No time? Break the 30 minutes into three, 10-minute segments during the day.  Stop smoking. If you do smoke, contact your caregiver to discuss ways in which they can help you quit.  Do not use street drugs.  Maintain a normal weight.  Maintain a healthy blood pressure.  Keep up with your blood work for checking the fats in your blood as directed by your caregiver. Document Released: 02/11/2004 Document Revised: 10/25/2011 Document Reviewed: 09/08/2008 [employer] Patient Information 2014 [employer].  

## 2012-12-14 ENCOUNTER — Ambulatory Visit: Payer: BC Managed Care – PPO | Admitting: General Practice

## 2012-12-15 LAB — CMP14+EGFR
ALT: 22 IU/L (ref 0–32)
Albumin/Globulin Ratio: 1.9 (ref 1.1–2.5)
BUN: 15 mg/dL (ref 8–27)
Calcium: 9.5 mg/dL (ref 8.6–10.2)
Creatinine, Ser: 0.84 mg/dL (ref 0.57–1.00)
GFR calc non Af Amer: 76 mL/min/{1.73_m2} (ref >59)
Globulin, Total: 2.4 g/dL (ref 1.5–4.5)
Glucose: 89 mg/dL (ref 65–99)
Total Protein: 7 g/dL (ref 6.0–8.5)

## 2012-12-15 LAB — NMR, LIPOPROFILE
HDL Cholesterol by NMR: 38 mg/dL — ABNORMAL LOW (ref >=40)
HDL Particle Number: 32.1 umol/L (ref >=30.5)
LDLC SERPL CALC-MCNC: 90 mg/dL (ref <100)
Small LDL Particle Number: 1098 nmol/L — ABNORMAL HIGH (ref <=527)
Triglycerides by NMR: 302 mg/dL — ABNORMAL HIGH (ref <150)

## 2012-12-17 ENCOUNTER — Telehealth: Payer: Self-pay | Admitting: General Practice

## 2012-12-17 NOTE — Telephone Encounter (Signed)
Pt aware of labs  

## 2012-12-25 ENCOUNTER — Ambulatory Visit (HOSPITAL_COMMUNITY): Payer: Self-pay | Admitting: Psychiatry

## 2012-12-25 ENCOUNTER — Ambulatory Visit (HOSPITAL_COMMUNITY): Payer: BC Managed Care – PPO | Admitting: Psychiatry

## 2013-02-05 ENCOUNTER — Encounter: Payer: Self-pay | Admitting: *Deleted

## 2013-03-14 ENCOUNTER — Encounter: Payer: Self-pay | Admitting: General Practice

## 2013-03-14 ENCOUNTER — Ambulatory Visit (INDEPENDENT_AMBULATORY_CARE_PROVIDER_SITE_OTHER): Payer: BC Managed Care – PPO | Admitting: General Practice

## 2013-03-14 VITALS — BP 136/88 | HR 92 | Temp 96.9°F | Ht 61.5 in | Wt 158.5 lb

## 2013-03-14 DIAGNOSIS — B351 Tinea unguium: Secondary | ICD-10-CM

## 2013-03-14 DIAGNOSIS — Z833 Family history of diabetes mellitus: Secondary | ICD-10-CM

## 2013-03-14 DIAGNOSIS — Z139 Encounter for screening, unspecified: Secondary | ICD-10-CM

## 2013-03-14 DIAGNOSIS — Z09 Encounter for follow-up examination after completed treatment for conditions other than malignant neoplasm: Secondary | ICD-10-CM

## 2013-03-14 DIAGNOSIS — E785 Hyperlipidemia, unspecified: Secondary | ICD-10-CM

## 2013-03-14 LAB — POCT CBC
HCT, POC: 37.4 % — AB (ref 37.7–47.9)
Hemoglobin: 12.6 g/dL (ref 12.2–16.2)
MCH, POC: 27.8 pg (ref 27–31.2)
MCHC: 33.7 g/dL (ref 31.8–35.4)
MCV: 82.4 fL (ref 80–97)
MPV: 8.4 fL (ref 0–99.8)
Platelet Count, POC: 241 10*3/uL (ref 142–424)
RBC: 4.5 M/uL (ref 4.04–5.48)

## 2013-03-14 NOTE — Progress Notes (Signed)
  Subjective:    Patient ID: Ann Snyder, female    DOB: January 24, 1951, 62 y.o.   MRN: [id number]  HPI Patient presents today for 3 month follow up. She has a history of depression, anxiety and hyperlipidemia. She reports only taking atorvastatin and discontinued medications prescribed by behavioral health. She denies thoughts of harming self or others. Reports discontinuing lipitor, zoloft and wellbutrin XL two months ago. She reports mood swings and anxiety are well controlled. She reports trying to eat healthier and exercise more.  She reports having a dark toenail on right foot and concerned about fungus. She reports wanting to have a colonoscopy.    Review of Systems  Constitutional: Negative for fever and chills.  Respiratory: Negative for chest tightness and shortness of breath.   Cardiovascular: Negative for chest pain, palpitations and leg swelling.  Gastrointestinal: Negative for nausea, vomiting, abdominal pain, diarrhea, constipation and blood in stool.  Genitourinary: Negative for hematuria and difficulty urinating.  Musculoskeletal: Negative for back pain and neck pain.  Neurological: Negative for dizziness, weakness, numbness and headaches.  Psychiatric/Behavioral: Negative for suicidal ideas and self-injury. The patient is not nervous/anxious.        Objective:   Physical Exam  Constitutional: She is oriented to person, place, and time. She appears well-developed and well-nourished.  HENT:  Head: Normocephalic and atraumatic.  Right Ear: External ear normal.  Left Ear: External ear normal.  Mouth/Throat: Oropharynx is clear and moist.  Eyes: EOM are normal. Pupils are equal, round, and reactive to light.  Neck: Normal range of motion. Neck supple. No thyromegaly present.  Cardiovascular: Normal rate, regular rhythm and normal heart sounds.   Pulmonary/Chest: Effort normal and breath sounds normal. No respiratory distress. She exhibits no tenderness.  Abdominal: Soft.  Bowel sounds are normal. She exhibits no distension. There is no tenderness.  Lymphadenopathy:    She has no cervical adenopathy.  Neurological: She is alert and oriented to person, place, and time.  Skin: Skin is warm and dry.  Discolored right great toenail, fungus appearance  Psychiatric: She has a normal mood and affect.          Assessment & Plan:  1. Hyperlipidemia  - CMP14+EGFR - NMR, lipoprofile  2. Family history of diabetes mellitus  - POCT glycosylated hemoglobin (Hb A1C)  3. Toenail fungus  - Ambulatory referral to Podiatry  4. Follow-up exam, 3-6 months since previous exam  - POCT CBC  5. Screening  - Ambulatory referral to Gastroenterology -Continue all current medications Labs pending F/u in 3 months Discussed exercise and diet  Patient verbalized understanding -Coralie Keens, FNP-C

## 2013-03-14 NOTE — Patient Instructions (Signed)
Hypertriglyceridemia  Diet for High blood levels of Triglycerides Most fats in food are triglycerides. Triglycerides in your blood are stored as fat in your body. High levels of triglycerides in your blood may put you at a greater risk for heart disease and stroke.  Normal triglyceride levels are less than 150 mg/dL. Borderline high levels are 150-199 mg/dl. High levels are 200 - 499 mg/dL, and very high triglyceride levels are greater than 500 mg/dL. The decision to treat high triglycerides is generally based on the level. For people with borderline or high triglyceride levels, treatment includes weight loss and exercise. Drugs are recommended for people with very high triglyceride levels. Many people who need treatment for high triglyceride levels have metabolic syndrome. This syndrome is a collection of disorders that often include: insulin resistance, high blood pressure, blood clotting problems, high cholesterol and triglycerides. TESTING PROCEDURE FOR TRIGLYCERIDES  You should not eat 4 hours before getting your triglycerides measured. The normal range of triglycerides is between 10 and 250 milligrams per deciliter (mg/dl). Some people may have extreme levels (1000 or above), but your triglyceride level may be too high if it is above 150 mg/dl, depending on what other risk factors you have for heart disease.  People with high blood triglycerides may also have high blood cholesterol levels. If you have high blood cholesterol as well as high blood triglycerides, your risk for heart disease is probably greater than if you only had high triglycerides. High blood cholesterol is one of the main risk factors for heart disease. CHANGING YOUR DIET  Your weight can affect your blood triglyceride level. If you are more than 20% above your ideal body weight, you may be able to lower your blood triglycerides by losing weight. Eating less and exercising regularly is the best way to combat this. Fat provides more  calories than any other food. The best way to lose weight is to eat less fat. Only 30% of your total calories should come from fat. Less than 7% of your diet should come from saturated fat. A diet low in fat and saturated fat is the same as a diet to decrease blood cholesterol. By eating a diet lower in fat, you may lose weight, lower your blood cholesterol, and lower your blood triglyceride level.  Eating a diet low in fat, especially saturated fat, may also help you lower your blood triglyceride level. Ask your dietitian to help you figure how much fat you can eat based on the number of calories your caregiver has prescribed for you.  Exercise, in addition to helping with weight loss may also help lower triglyceride levels.   Alcohol can increase blood triglycerides. You may need to stop drinking alcoholic beverages.  Too much carbohydrate in your diet may also increase your blood triglycerides. Some complex carbohydrates are necessary in your diet. These may include bread, rice, potatoes, other starchy vegetables and cereals.  Reduce "simple" carbohydrates. These may include pure sugars, candy, honey, and jelly without losing other nutrients. If you have the kind of high blood triglycerides that is affected by the amount of carbohydrates in your diet, you will need to eat less sugar and less high-sugar foods. Your caregiver can help you with this.  Adding 2-4 grams of fish oil (EPA+ DHA) may also help lower triglycerides. Speak with your caregiver before adding any supplements to your regimen. Following the Diet  Maintain your ideal weight. Your caregivers can help you with a diet. Generally, eating less food and getting more   exercise will help you lose weight. Joining a weight control group may also help. Ask your caregivers for a good weight control group in your area.  Eat low-fat foods instead of high-fat foods. This can help you lose weight too.  These foods are lower in fat. Eat MORE of these:    Dried beans, peas, and lentils.  Egg whites.  Low-fat cottage cheese.  Fish.  Lean cuts of meat, such as round, sirloin, rump, and flank (cut extra fat off meat you fix).  Whole grain breads, cereals and pasta.  Skim and nonfat dry milk.  Low-fat yogurt.  Poultry without the skin.  Cheese made with skim or part-skim milk, such as mozzarella, parmesan, farmers', ricotta, or pot cheese. These are higher fat foods. Eat LESS of these:   Whole milk and foods made from whole milk, such as American, blue, cheddar, monterey jack, and swiss cheese  High-fat meats, such as luncheon meats, sausages, knockwurst, bratwurst, hot dogs, ribs, corned beef, ground pork, and regular ground beef.  Fried foods. Limit saturated fats in your diet. Substituting unsaturated fat for saturated fat may decrease your blood triglyceride level. You will need to read package labels to know which products contain saturated fats.  These foods are high in saturated fat. Eat LESS of these:   Fried pork skins.  Whole milk.  Skin and fat from poultry.  Palm oil.  Butter.  Shortening.  Cream cheese.  Bacon.  Margarines and baked goods made from listed oils.  Vegetable shortenings.  Chitterlings.  Fat from meats.  Coconut oil.  Palm kernel oil.  Lard.  Cream.  Sour cream.  Fatback.  Coffee whiteners and non-dairy creamers made with these oils.  Cheese made from whole milk. Use unsaturated fats (both polyunsaturated and monounsaturated) moderately. Remember, even though unsaturated fats are better than saturated fats; you still want a diet low in total fat.  These foods are high in unsaturated fat:   Canola oil.  Sunflower oil.  Mayonnaise.  Almonds.  Peanuts.  Pine nuts.  Margarines made with these oils.  Safflower oil.  Olive oil.  Avocados.  Cashews.  Peanut butter.  Sunflower seeds.  Soybean oil.  Peanut  oil.  Olives.  Pecans.  Walnuts.  Pumpkin seeds. Avoid sugar and other high-sugar foods. This will decrease carbohydrates without decreasing other nutrients. Sugar in your food goes rapidly to your blood. When there is excess sugar in your blood, your liver may use it to make more triglycerides. Sugar also contains calories without other important nutrients.  Eat LESS of these:   Sugar, brown sugar, powdered sugar, jam, jelly, preserves, honey, syrup, molasses, pies, candy, cakes, cookies, frosting, pastries, colas, soft drinks, punches, fruit drinks, and regular gelatin.  Avoid alcohol. Alcohol, even more than sugar, may increase blood triglycerides. In addition, alcohol is high in calories and low in nutrients. Ask for sparkling water, or a diet soft drink instead of an alcoholic beverage. Suggestions for planning and preparing meals   Bake, broil, grill or roast meats instead of frying.  Remove fat from meats and skin from poultry before cooking.  Add spices, herbs, lemon juice or vinegar to vegetables instead of salt, rich sauces or gravies.  Use a non-stick skillet without fat or use no-stick sprays.  Cool and refrigerate stews and broth. Then remove the hardened fat floating on the surface before serving.  Refrigerate meat drippings and skim off fat to make low-fat gravies.  Serve more fish.  Use less butter,   margarine and other high-fat spreads on bread or vegetables.  Use skim or reconstituted non-fat dry milk for cooking.  Cook with low-fat cheeses.  Substitute low-fat yogurt or cottage cheese for all or part of the sour cream in recipes for sauces, dips or congealed salads.  Use half yogurt/half mayonnaise in salad recipes.  Substitute evaporated skim milk for cream. Evaporated skim milk or reconstituted non-fat dry milk can be whipped and substituted for whipped cream in certain recipes.  Choose fresh fruits for dessert instead of high-fat foods such as pies or  cakes. Fruits are naturally low in fat. When Dining Out   Order low-fat appetizers such as fruit or vegetable juice, pasta with vegetables or tomato sauce.  Select clear, rather than cream soups.  Ask that dressings and gravies be served on the side. Then use less of them.  Order foods that are baked, broiled, poached, steamed, stir-fried, or roasted.  Ask for margarine instead of butter, and use only a small amount.  Drink sparkling water, unsweetened tea or coffee, or diet soft drinks instead of alcohol or other sweet beverages. QUESTIONS AND ANSWERS ABOUT OTHER FATS IN THE BLOOD: SATURATED FAT, TRANS FAT, AND CHOLESTEROL What is trans fat? Trans fat is a type of fat that is formed when vegetable oil is hardened through a process called hydrogenation. This process helps makes foods more solid, gives them shape, and prolongs their shelf life. Trans fats are also called hydrogenated or partially hydrogenated oils.  What do saturated fat, trans fat, and cholesterol in foods have to do with heart disease? Saturated fat, trans fat, and cholesterol in the diet all raise the level of LDL "bad" cholesterol in the blood. The higher the LDL cholesterol, the greater the risk for coronary heart disease (CHD). Saturated fat and trans fat raise LDL similarly.  What foods contain saturated fat, trans fat, and cholesterol? High amounts of saturated fat are found in animal products, such as fatty cuts of meat, chicken skin, and full-fat dairy products like butter, whole milk, cream, and cheese, and in tropical vegetable oils such as palm, palm kernel, and coconut oil. Trans fat is found in some of the same foods as saturated fat, such as vegetable shortening, some margarines (especially hard or stick margarine), crackers, cookies, baked goods, fried foods, salad dressings, and other processed foods made with partially hydrogenated vegetable oils. Small amounts of trans fat also occur naturally in some animal  products, such as milk products, beef, and lamb. Foods high in cholesterol include liver, other organ meats, egg yolks, shrimp, and full-fat dairy products. How can I use the new food label to make heart-healthy food choices? Check the Nutrition Facts panel of the food label. Choose foods lower in saturated fat, trans fat, and cholesterol. For saturated fat and cholesterol, you can also use the Percent Daily Value (%DV): 5% DV or less is low, and 20% DV or more is high. (There is no %DV for trans fat.) Use the Nutrition Facts panel to choose foods low in saturated fat and cholesterol, and if the trans fat is not listed, read the ingredients and limit products that list shortening or hydrogenated or partially hydrogenated vegetable oil, which tend to be high in trans fat. POINTS TO REMEMBER:   Discuss your risk for heart disease with your caregivers, and take steps to reduce risk factors.  Change your diet. Choose foods that are low in saturated fat, trans fat, and cholesterol.  Add exercise to your daily routine if   it is not already being done. Participate in physical activity of moderate intensity, like brisk walking, for at least 30 minutes on most, and preferably all days of the week. No time? Break the 30 minutes into three, 10-minute segments during the day.  Stop smoking. If you do smoke, contact your caregiver to discuss ways in which they can help you quit.  Do not use street drugs.  Maintain a normal weight.  Maintain a healthy blood pressure.  Keep up with your blood work for checking the fats in your blood as directed by your caregiver. Document Released: 02/11/2004 Document Revised: 10/25/2011 Document Reviewed: 09/08/2008 [employer] Patient Information 2014 [employer].  

## 2013-03-15 ENCOUNTER — Ambulatory Visit: Payer: BC Managed Care – PPO | Admitting: General Practice

## 2013-03-16 LAB — CMP14+EGFR
Albumin/Globulin Ratio: 1.9 (ref 1.1–2.5)
Albumin: 4.3 g/dL (ref 3.6–4.8)
Alkaline Phosphatase: 114 IU/L (ref 39–117)
BUN/Creatinine Ratio: 30 — ABNORMAL HIGH (ref 11–26)
BUN: 19 mg/dL (ref 8–27)
Chloride: 104 mmol/L (ref 97–108)
GFR calc Af Amer: 111 mL/min/{1.73_m2} (ref >59)
GFR calc non Af Amer: 97 mL/min/{1.73_m2} (ref >59)
Total Bilirubin: <0.2 mg/dL (ref 0.0–1.2)

## 2013-03-16 LAB — NMR, LIPOPROFILE
Cholesterol: 161 mg/dL (ref <200)
HDL Cholesterol by NMR: 37 mg/dL — ABNORMAL LOW (ref >=40)
HDL Particle Number: 31.7 umol/L (ref >=30.5)
LDL Particle Number: 1107 nmol/L — ABNORMAL HIGH (ref <1000)
LDL Size: 20.4 nm — ABNORMAL LOW (ref >20.5)
LP-IR Score: 72 — ABNORMAL HIGH (ref <=45)
Small LDL Particle Number: 677 nmol/L — ABNORMAL HIGH (ref <=527)
Triglycerides by NMR: 240 mg/dL — ABNORMAL HIGH (ref <150)

## 2013-03-22 ENCOUNTER — Telehealth: Payer: Self-pay | Admitting: General Practice

## 2013-03-23 NOTE — Telephone Encounter (Signed)
Message left that labs were improved

## 2013-03-26 ENCOUNTER — Telehealth: Payer: Self-pay | Admitting: General Practice

## 2013-03-26 NOTE — Telephone Encounter (Signed)
Continue cholesterol medication and will recheck lab in 3 months.

## 2013-03-26 NOTE — Telephone Encounter (Signed)
Patient aware.

## 2013-04-02 ENCOUNTER — Encounter: Payer: Self-pay | Admitting: Internal Medicine

## 2013-04-09 ENCOUNTER — Ambulatory Visit (INDEPENDENT_AMBULATORY_CARE_PROVIDER_SITE_OTHER): Payer: BC Managed Care – PPO | Admitting: General Practice

## 2013-04-09 ENCOUNTER — Encounter: Payer: Self-pay | Admitting: General Practice

## 2013-04-09 VITALS — BP 132/85 | HR 78 | Temp 97.5°F | Ht 62.0 in | Wt 160.5 lb

## 2013-04-09 DIAGNOSIS — Z0000 Encounter for general adult medical examination without abnormal findings: Secondary | ICD-10-CM

## 2013-04-09 DIAGNOSIS — Z124 Encounter for screening for malignant neoplasm of cervix: Secondary | ICD-10-CM

## 2013-04-09 NOTE — Patient Instructions (Signed)

## 2013-04-09 NOTE — Progress Notes (Signed)
   Subjective:    Patient ID: Ann Snyder, female    DOB: 01/29/1951, 62 y.o.   MRN: [id number]  HPI Patient presents today for annual exam (pap, with no breast exam). Reports mammogram in July 2014. She has a history of hyperlipidemia and taking lipitor as prescribed. Denies any problems or concerns.     Review of Systems  Constitutional: Negative for fever and chills.  Respiratory: Negative for cough, chest tightness, shortness of breath and wheezing.   Cardiovascular: Negative for chest pain, palpitations and leg swelling.  Gastrointestinal: Negative for nausea, vomiting, abdominal pain, diarrhea, constipation and blood in stool.  Genitourinary: Negative for dysuria, frequency, hematuria, vaginal bleeding, vaginal discharge and difficulty urinating.  Musculoskeletal:       Joint pain periodically, arthritis  Neurological: Negative for dizziness, weakness and headaches.       Objective:   Physical Exam  Constitutional: She is oriented to person, place, and time. She appears well-developed and well-nourished.  HENT:  Head: Normocephalic and atraumatic.  Right Ear: External ear normal.  Left Ear: External ear normal.  Mouth/Throat: Oropharynx is clear and moist.  Eyes: EOM are normal. Pupils are equal, round, and reactive to light.  Neck: Normal range of motion. Neck supple. No thyromegaly present.  Cardiovascular: Normal rate, regular rhythm and normal heart sounds.   Pulmonary/Chest: Effort normal and breath sounds normal. No respiratory distress. She exhibits no tenderness.  Abdominal: Soft. Bowel sounds are normal. She exhibits no distension. There is no tenderness.  Genitourinary: There is no rash, tenderness, lesion or injury on the right labia. There is no rash, tenderness, lesion or injury on the left labia. Uterus is not deviated, not enlarged, not fixed and not tender. Cervix exhibits no motion tenderness, no discharge and no friability. Right adnexum displays no mass,  no tenderness and no fullness. Left adnexum displays no mass, no tenderness and no fullness. No erythema, tenderness or bleeding around the vagina. No foreign body around the vagina. No signs of injury around the vagina. No vaginal discharge found.  Musculoskeletal: She exhibits no edema and no tenderness.  Lymphadenopathy:    She has no cervical adenopathy.  Neurological: She is alert and oriented to person, place, and time.  Skin: Skin is warm and dry.  Psychiatric: She has a normal mood and affect.          Assessment & Plan:  1. Annual physical exam - Pap IG w/ reflex to HPV when ASC-U - DG Bone Density;  -Continue all current medications Discussed benefits of regular exercise and healthy eating Will be updated on bone density exam Patient verbalized understanding Coralie Keens, FNP-C

## 2013-04-11 ENCOUNTER — Other Ambulatory Visit (INDEPENDENT_AMBULATORY_CARE_PROVIDER_SITE_OTHER): Payer: BC Managed Care – PPO

## 2013-04-11 DIAGNOSIS — R7989 Other specified abnormal findings of blood chemistry: Secondary | ICD-10-CM

## 2013-04-11 LAB — PAP IG W/ RFLX HPV ASCU: PAP Smear Comment: 0

## 2013-04-11 NOTE — Progress Notes (Signed)
Patient came in with orders from Dr.Drake

## 2013-04-12 LAB — HEPATIC FUNCTION PANEL
ALT: 20 IU/L (ref 0–32)
AST: 20 IU/L (ref 0–40)
Albumin: 4.4 g/dL (ref 3.6–4.8)
Bilirubin, Direct: 0.06 mg/dL (ref 0.00–0.40)
Total Bilirubin: <0.2 mg/dL (ref 0.0–1.2)
Total Protein: 6.6 g/dL (ref 6.0–8.5)

## 2013-05-28 ENCOUNTER — Ambulatory Visit (AMBULATORY_SURGERY_CENTER): Payer: Self-pay

## 2013-05-28 VITALS — Ht 62.0 in | Wt 161.8 lb

## 2013-05-28 DIAGNOSIS — Z1211 Encounter for screening for malignant neoplasm of colon: Secondary | ICD-10-CM

## 2013-05-28 MED ORDER — MOVIPREP 100 G PO SOLR: ORAL | Status: DC

## 2013-05-29 ENCOUNTER — Encounter: Payer: Self-pay | Admitting: Internal Medicine

## 2013-05-29 ENCOUNTER — Ambulatory Visit (INDEPENDENT_AMBULATORY_CARE_PROVIDER_SITE_OTHER): Payer: BC Managed Care – PPO

## 2013-05-29 ENCOUNTER — Encounter: Payer: Self-pay | Admitting: Pharmacist

## 2013-05-29 ENCOUNTER — Ambulatory Visit (INDEPENDENT_AMBULATORY_CARE_PROVIDER_SITE_OTHER): Payer: BC Managed Care – PPO | Admitting: Pharmacist

## 2013-05-29 VITALS — Ht 62.0 in | Wt 160.5 lb

## 2013-05-29 DIAGNOSIS — E8881 Metabolic syndrome: Secondary | ICD-10-CM | POA: Insufficient documentation

## 2013-05-29 DIAGNOSIS — M899 Disorder of bone, unspecified: Secondary | ICD-10-CM

## 2013-05-29 DIAGNOSIS — Z0000 Encounter for general adult medical examination without abnormal findings: Secondary | ICD-10-CM

## 2013-05-29 DIAGNOSIS — M949 Disorder of cartilage, unspecified: Secondary | ICD-10-CM

## 2013-05-29 DIAGNOSIS — E785 Hyperlipidemia, unspecified: Secondary | ICD-10-CM | POA: Insufficient documentation

## 2013-05-29 DIAGNOSIS — M8580 Other specified disorders of bone density and structure, unspecified site: Secondary | ICD-10-CM

## 2013-05-29 NOTE — Progress Notes (Signed)
Patient ID: Ann Snyder, female   DOB: 08/17/1950, 63 y.o.   MRN: [id number]  [facility] Current Height: Height: 5\' 2"  (157.5 cm)      Max Lifetime Height:  5\' 3"  Current Weight: Weight: 160 lb 8 oz (72.802 kg)       Ethnicity:Caucasian    HPI: Does pt already have a diagnosis of:  Osteopenia?  No Osteoporosis?  No  Back Pain?  Yes       Kyphosis?  No Prior fracture?  No Med(s) for Osteoporosis/Osteopenia:  none Med(s) previously tried for Osteoporosis/Osteopenia:  non                                                             PMH: Age at menopause:   About 63  yo Hysterectomy?  No Oophorectomy?  No HRT? No Steroid Use?  No Thyroid med?  No History of cancer?  No History of digestive disorders (ie Crohn's)?  No Current or previous eating disorders?  No Last Vitamin D Result:  38.8 (2011) Last GFR Result:  97 (03/14/2013)   FH/SH: Family history of osteoporosis?  No Parent with history of hip fracture?  No Family history of breast cancer?  No Exercise?  No Smoking?  No Alcohol?  No    Calcium Assessment Calcium Intake  # of servings/day  Calcium mg  Milk (8 oz) 0  x  300  = 0  Yogurt (4 oz) 1 x  200 = 200mg   Cheese (1 oz) 0 x  200 = 0  Other Calcium sources   250mg   Ca supplement MVI = 400mg    Estimated calcium intake per day 850mg     DEXA Results Date of Test T-Score for AP Spine L1-L4 T-Score for Total Left Hip T-Score for Total Right Hip  05/29/2013 -0.5 -0.6 -0.7  02/05/2007 0.3 -0.2 -0.4  11/22/2004 0.5 -0.4 --       ** T-Score for Neck of right hip = -1.2**   FRAX 10 year estimate: Total FX risk:  7.3%  (consider medication if >/= 20%) Hip FX risk:  0.5%  (consider medication if >/= 3%)  Assessment: osteopenia  Recommendations: 1.  Discussed fracture risk and BMD results with patient 2.  recommend calcium 1200mg  daily through supplementation or diet.  3.  recommend weight bearing exercise - 30 minutes at least 4 days per week.   Patient works at [employer] and is going to look into [employer] and using their workout facilities for exercise. 4.  Counseled and educated about fall risk and prevention. 5.  Vitamin D recheck needed with next labs  Recheck DEXA:  2 years  Time spent counseling patient:  25 minutes

## 2013-05-29 NOTE — Patient Instructions (Signed)

## 2013-06-11 ENCOUNTER — Encounter: Payer: Self-pay | Admitting: Internal Medicine

## 2013-06-11 ENCOUNTER — Ambulatory Visit (AMBULATORY_SURGERY_CENTER): Payer: BC Managed Care – PPO | Admitting: Internal Medicine

## 2013-06-11 VITALS — BP 144/92 | HR 61 | Temp 97.5°F | Resp 15 | Ht 62.0 in | Wt 161.0 lb

## 2013-06-11 DIAGNOSIS — Z1211 Encounter for screening for malignant neoplasm of colon: Secondary | ICD-10-CM

## 2013-06-11 MED ORDER — SODIUM CHLORIDE 0.9 % IV SOLN: 500.0000 mL | INTRAVENOUS | Status: DC

## 2013-06-11 NOTE — Op Note (Signed)
[facility] [address] [place], [id number]   COLONOSCOPY PROCEDURE REPORT  PATIENT: Ann RakersGreenway, [name]  MR#: [id number] BIRTHDATE: 1950/07/26 , 62  yrs. old GENDER: Female ENDOSCOPIST: Hart Carwinora M Abena Erdman, MD REFERRED AO:[id number]:Donald Christell ConstantMoore, M.D. , Dr Quincy SimmondsHolyburton PROCEDURE DATE:  06/11/2013 PROCEDURE:   Colonoscopy, screening First Screening Colonoscopy - Avg.  risk and is 50 yrs.  old or older Yes.  Prior Negative Screening - Now for repeat screening. N/A  History of Adenoma - Now for follow-up colonoscopy & has been > or = to 3 yrs.  N/A  Polyps Removed Today? No.  Recommend repeat exam, <10 yrs? No. ASA CLASS:   Class II INDICATIONS:Average risk patient for colon cancer. MEDICATIONS: MAC sedation, administered by CRNA and Propofol (Diprivan) 340 mg  DESCRIPTION OF PROCEDURE:   After the risks benefits and alternatives of the procedure were thoroughly explained, informed consent was obtained.  A digital rectal exam revealed no abnormalities of the rectum.   The LB [id number]-[id number]  endoscope was introduced through the anus and advanced to the cecum, which was identified by both the appendix and ileocecal valve. No adverse events experienced.   The quality of the prep was good, using MoviPrep  The instrument was then slowly withdrawn as the colon was fully examined.      COLON FINDINGS: A normal appearing cecum, ileocecal valve, and appendiceal orifice were identified.  The ascending, hepatic flexure, transverse, splenic flexure, descending, sigmoid colon and rectum appeared unremarkable.  No polyps or cancers were seen. Retroflexed views revealed no abnormalities. The time to cecum=8 minutes 55 seconds.  Withdrawal time=7 minutes 41 seconds.  The scope was withdrawn and the procedure completed. COMPLICATIONS: There were no complications.  ENDOSCOPIC IMPRESSION: Normal colon  RECOMMENDATIONS: high fiber diet Recall colonoscopy in 10 years   eSigned:  Hart Carwinora  M Adyson Vanburen, MD 06/11/2013 8:30 AM   cc:

## 2013-06-11 NOTE — Progress Notes (Signed)
Report to pacu rn, vss, bbs=clear 

## 2013-06-11 NOTE — Patient Instructions (Signed)
YOU HAD AN ENDOSCOPIC PROCEDURE TODAY AT THE [facility]: Refer to the procedure report that was given to you for any specific questions about what was found during the examination.  If the procedure report does not answer your questions, please call your gastroenterologist to clarify.  If you requested that your care partner not be given the details of your procedure findings, then the procedure report has been included in a sealed envelope for you to review at your convenience later.  YOU SHOULD EXPECT: Some feelings of bloating in the abdomen. Passage of more gas than usual.  Walking can help get rid of the air that was put into your GI tract during the procedure and reduce the bloating. If you had a lower endoscopy (such as a colonoscopy or flexible sigmoidoscopy) you may notice spotting of blood in your stool or on the toilet paper. If you underwent a bowel prep for your procedure, then you may not have a normal bowel movement for a few days.  DIET: Your first meal following the procedure should be a light meal and then it is ok to progress to your normal diet.  A half-sandwich or bowl of soup is an example of a good first meal.  Heavy or fried foods are harder to digest and may make you feel nauseous or bloated.  Likewise meals heavy in dairy and vegetables can cause extra gas to form and this can also increase the bloating.  Drink plenty of fluids but you should avoid alcoholic beverages for 24 hours.  ACTIVITY: Your care partner should take you home directly after the procedure.  You should plan to take it easy, moving slowly for the rest of the day.  You can resume normal activity the day after the procedure however you should NOT DRIVE or use heavy machinery for 24 hours (because of the sedation medicines used during the test).    SYMPTOMS TO REPORT IMMEDIATELY: A gastroenterologist can be reached at any hour.  During normal business hours, 8:30 AM to 5:00 PM Monday through Friday,  call [phone].  After hours and on weekends, please call the GI answering service at [phone] who will take a message and have the physician on call contact you.   Following lower endoscopy (colonoscopy or flexible sigmoidoscopy):  Excessive amounts of blood in the stool  Significant tenderness or worsening of abdominal pains  Swelling of the abdomen that is new, acute  Fever of 100F or higher  FOLLOW UP: If any biopsies were taken you will be contacted by phone or by letter within the next 1-3 weeks.  Call your gastroenterologist if you have not heard about the biopsies in 3 weeks.  Our staff will call the home number listed on your records the next business day following your procedure to check on you and address any questions or concerns that you may have at that time regarding the information given to you following your procedure. This is a courtesy call and so if there is no answer at the home number and we have not heard from you through the emergency physician on call, we will assume that you have returned to your regular daily activities without incident.  Try to increase the fiber in your diet.  SIGNATURES/CONFIDENTIALITY: You and/or your care partner have signed paperwork which will be entered into your electronic medical record.  These signatures attest to the fact that that the information above on your After Visit Summary has been reviewed and is  understood.  Full responsibility of the confidentiality of this discharge information lies with you and/or your care-partner. 

## 2013-06-12 ENCOUNTER — Telehealth: Payer: Self-pay | Admitting: *Deleted

## 2013-06-12 NOTE — Telephone Encounter (Signed)
Number identifier, left message, follow-up  

## 2013-06-14 ENCOUNTER — Encounter: Payer: Self-pay | Admitting: General Practice

## 2013-06-14 ENCOUNTER — Ambulatory Visit (INDEPENDENT_AMBULATORY_CARE_PROVIDER_SITE_OTHER): Payer: BC Managed Care – PPO | Admitting: General Practice

## 2013-06-14 VITALS — BP 134/78 | HR 84 | Temp 97.0°F | Ht 62.0 in | Wt 158.0 lb

## 2013-06-14 DIAGNOSIS — E785 Hyperlipidemia, unspecified: Secondary | ICD-10-CM

## 2013-06-14 DIAGNOSIS — Z13820 Encounter for screening for osteoporosis: Secondary | ICD-10-CM

## 2013-06-14 DIAGNOSIS — Z09 Encounter for follow-up examination after completed treatment for conditions other than malignant neoplasm: Secondary | ICD-10-CM

## 2013-06-14 MED ORDER — ATORVASTATIN CALCIUM 20 MG PO TABS: 20.0000 mg | ORAL_TABLET | Freq: Every day | ORAL | Status: DC

## 2013-06-14 NOTE — Patient Instructions (Signed)
Hypertriglyceridemia  Diet for High blood levels of Triglycerides Most fats in food are triglycerides. Triglycerides in your blood are stored as fat in your body. High levels of triglycerides in your blood may put you at a greater risk for heart disease and stroke.  Normal triglyceride levels are less than 150 mg/dL. Borderline high levels are 150-199 mg/dl. High levels are 200 - 499 mg/dL, and very high triglyceride levels are greater than 500 mg/dL. The decision to treat high triglycerides is generally based on the level. For people with borderline or high triglyceride levels, treatment includes weight loss and exercise. Drugs are recommended for people with very high triglyceride levels. Many people who need treatment for high triglyceride levels have metabolic syndrome. This syndrome is a collection of disorders that often include: insulin resistance, high blood pressure, blood clotting problems, high cholesterol and triglycerides. TESTING PROCEDURE FOR TRIGLYCERIDES  You should not eat 4 hours before getting your triglycerides measured. The normal range of triglycerides is between 10 and 250 milligrams per deciliter (mg/dl). Some people may have extreme levels (1000 or above), but your triglyceride level may be too high if it is above 150 mg/dl, depending on what other risk factors you have for heart disease.  People with high blood triglycerides may also have high blood cholesterol levels. If you have high blood cholesterol as well as high blood triglycerides, your risk for heart disease is probably greater than if you only had high triglycerides. High blood cholesterol is one of the main risk factors for heart disease. CHANGING YOUR DIET  Your weight can affect your blood triglyceride level. If you are more than 20% above your ideal body weight, you may be able to lower your blood triglycerides by losing weight. Eating less and exercising regularly is the best way to combat this. Fat provides more  calories than any other food. The best way to lose weight is to eat less fat. Only 30% of your total calories should come from fat. Less than 7% of your diet should come from saturated fat. A diet low in fat and saturated fat is the same as a diet to decrease blood cholesterol. By eating a diet lower in fat, you may lose weight, lower your blood cholesterol, and lower your blood triglyceride level.  Eating a diet low in fat, especially saturated fat, may also help you lower your blood triglyceride level. Ask your dietitian to help you figure how much fat you can eat based on the number of calories your caregiver has prescribed for you.  Exercise, in addition to helping with weight loss may also help lower triglyceride levels.   Alcohol can increase blood triglycerides. You may need to stop drinking alcoholic beverages.  Too much carbohydrate in your diet may also increase your blood triglycerides. Some complex carbohydrates are necessary in your diet. These may include bread, rice, potatoes, other starchy vegetables and cereals.  Reduce "simple" carbohydrates. These may include pure sugars, candy, honey, and jelly without losing other nutrients. If you have the kind of high blood triglycerides that is affected by the amount of carbohydrates in your diet, you will need to eat less sugar and less high-sugar foods. Your caregiver can help you with this.  Adding 2-4 grams of fish oil (EPA+ DHA) may also help lower triglycerides. Speak with your caregiver before adding any supplements to your regimen. Following the Diet  Maintain your ideal weight. Your caregivers can help you with a diet. Generally, eating less food and getting more   exercise will help you lose weight. Joining a weight control group may also help. Ask your caregivers for a good weight control group in your area.  Eat low-fat foods instead of high-fat foods. This can help you lose weight too.  These foods are lower in fat. Eat MORE of these:    Dried beans, peas, and lentils.  Egg whites.  Low-fat cottage cheese.  Fish.  Lean cuts of meat, such as round, sirloin, rump, and flank (cut extra fat off meat you fix).  Whole grain breads, cereals and pasta.  Skim and nonfat dry milk.  Low-fat yogurt.  Poultry without the skin.  Cheese made with skim or part-skim milk, such as mozzarella, parmesan, farmers', ricotta, or pot cheese. These are higher fat foods. Eat LESS of these:   Whole milk and foods made from whole milk, such as American, blue, cheddar, monterey jack, and swiss cheese  High-fat meats, such as luncheon meats, sausages, knockwurst, bratwurst, hot dogs, ribs, corned beef, ground pork, and regular ground beef.  Fried foods. Limit saturated fats in your diet. Substituting unsaturated fat for saturated fat may decrease your blood triglyceride level. You will need to read package labels to know which products contain saturated fats.  These foods are high in saturated fat. Eat LESS of these:   Fried pork skins.  Whole milk.  Skin and fat from poultry.  Palm oil.  Butter.  Shortening.  Cream cheese.  Bacon.  Margarines and baked goods made from listed oils.  Vegetable shortenings.  Chitterlings.  Fat from meats.  Coconut oil.  Palm kernel oil.  Lard.  Cream.  Sour cream.  Fatback.  Coffee whiteners and non-dairy creamers made with these oils.  Cheese made from whole milk. Use unsaturated fats (both polyunsaturated and monounsaturated) moderately. Remember, even though unsaturated fats are better than saturated fats; you still want a diet low in total fat.  These foods are high in unsaturated fat:   Canola oil.  Sunflower oil.  Mayonnaise.  Almonds.  Peanuts.  Pine nuts.  Margarines made with these oils.  Safflower oil.  Olive oil.  Avocados.  Cashews.  Peanut butter.  Sunflower seeds.  Soybean oil.  Peanut  oil.  Olives.  Pecans.  Walnuts.  Pumpkin seeds. Avoid sugar and other high-sugar foods. This will decrease carbohydrates without decreasing other nutrients. Sugar in your food goes rapidly to your blood. When there is excess sugar in your blood, your liver may use it to make more triglycerides. Sugar also contains calories without other important nutrients.  Eat LESS of these:   Sugar, brown sugar, powdered sugar, jam, jelly, preserves, honey, syrup, molasses, pies, candy, cakes, cookies, frosting, pastries, colas, soft drinks, punches, fruit drinks, and regular gelatin.  Avoid alcohol. Alcohol, even more than sugar, may increase blood triglycerides. In addition, alcohol is high in calories and low in nutrients. Ask for sparkling water, or a diet soft drink instead of an alcoholic beverage. Suggestions for planning and preparing meals   Bake, broil, grill or roast meats instead of frying.  Remove fat from meats and skin from poultry before cooking.  Add spices, herbs, lemon juice or vinegar to vegetables instead of salt, rich sauces or gravies.  Use a non-stick skillet without fat or use no-stick sprays.  Cool and refrigerate stews and broth. Then remove the hardened fat floating on the surface before serving.  Refrigerate meat drippings and skim off fat to make low-fat gravies.  Serve more fish.  Use less butter,   margarine and other high-fat spreads on bread or vegetables.  Use skim or reconstituted non-fat dry milk for cooking.  Cook with low-fat cheeses.  Substitute low-fat yogurt or cottage cheese for all or part of the sour cream in recipes for sauces, dips or congealed salads.  Use half yogurt/half mayonnaise in salad recipes.  Substitute evaporated skim milk for cream. Evaporated skim milk or reconstituted non-fat dry milk can be whipped and substituted for whipped cream in certain recipes.  Choose fresh fruits for dessert instead of high-fat foods such as pies or  cakes. Fruits are naturally low in fat. When Dining Out   Order low-fat appetizers such as fruit or vegetable juice, pasta with vegetables or tomato sauce.  Select clear, rather than cream soups.  Ask that dressings and gravies be served on the side. Then use less of them.  Order foods that are baked, broiled, poached, steamed, stir-fried, or roasted.  Ask for margarine instead of butter, and use only a small amount.  Drink sparkling water, unsweetened tea or coffee, or diet soft drinks instead of alcohol or other sweet beverages. QUESTIONS AND ANSWERS ABOUT OTHER FATS IN THE BLOOD: SATURATED FAT, TRANS FAT, AND CHOLESTEROL What is trans fat? Trans fat is a type of fat that is formed when vegetable oil is hardened through a process called hydrogenation. This process helps makes foods more solid, gives them shape, and prolongs their shelf life. Trans fats are also called hydrogenated or partially hydrogenated oils.  What do saturated fat, trans fat, and cholesterol in foods have to do with heart disease? Saturated fat, trans fat, and cholesterol in the diet all raise the level of LDL "bad" cholesterol in the blood. The higher the LDL cholesterol, the greater the risk for coronary heart disease (CHD). Saturated fat and trans fat raise LDL similarly.  What foods contain saturated fat, trans fat, and cholesterol? High amounts of saturated fat are found in animal products, such as fatty cuts of meat, chicken skin, and full-fat dairy products like butter, whole milk, cream, and cheese, and in tropical vegetable oils such as palm, palm kernel, and coconut oil. Trans fat is found in some of the same foods as saturated fat, such as vegetable shortening, some margarines (especially hard or stick margarine), crackers, cookies, baked goods, fried foods, salad dressings, and other processed foods made with partially hydrogenated vegetable oils. Small amounts of trans fat also occur naturally in some animal  products, such as milk products, beef, and lamb. Foods high in cholesterol include liver, other organ meats, egg yolks, shrimp, and full-fat dairy products. How can I use the new food label to make heart-healthy food choices? Check the Nutrition Facts panel of the food label. Choose foods lower in saturated fat, trans fat, and cholesterol. For saturated fat and cholesterol, you can also use the Percent Daily Value (%DV): 5% DV or less is low, and 20% DV or more is high. (There is no %DV for trans fat.) Use the Nutrition Facts panel to choose foods low in saturated fat and cholesterol, and if the trans fat is not listed, read the ingredients and limit products that list shortening or hydrogenated or partially hydrogenated vegetable oil, which tend to be high in trans fat. POINTS TO REMEMBER:   Discuss your risk for heart disease with your caregivers, and take steps to reduce risk factors.  Change your diet. Choose foods that are low in saturated fat, trans fat, and cholesterol.  Add exercise to your daily routine if   it is not already being done. Participate in physical activity of moderate intensity, like brisk walking, for at least 30 minutes on most, and preferably all days of the week. No time? Break the 30 minutes into three, 10-minute segments during the day.  Stop smoking. If you do smoke, contact your caregiver to discuss ways in which they can help you quit.  Do not use street drugs.  Maintain a normal weight.  Maintain a healthy blood pressure.  Keep up with your blood work for checking the fats in your blood as directed by your caregiver. Document Released: 02/11/2004 Document Revised: 10/25/2011 Document Reviewed: 09/08/2008 [employer] Patient Information 2014 [employer].  

## 2013-06-14 NOTE — Progress Notes (Signed)
   Subjective:    Patient ID: Ann Snyder, female    DOB: 1950/05/12, 63 y.o.   MRN: [id number]  HPI Patient presents today for chronic health follow up. History of HLD. She reports taking medications as prescribed. Working on healthy eating habits and some form of regular exercise.     Review of Systems  Constitutional: Negative for fever, chills and fatigue.  Respiratory: Negative for chest tightness and shortness of breath.   Cardiovascular: Negative for chest pain and palpitations.  Gastrointestinal: Negative for nausea, vomiting, abdominal pain, diarrhea, constipation and blood in stool.  Neurological: Negative for dizziness, weakness and headaches.  All other systems reviewed and are negative.       Objective:   Physical Exam  Constitutional: She is oriented to person, place, and time. She appears well-developed and well-nourished.  HENT:  Head: Normocephalic and atraumatic.  Right Ear: External ear normal.  Left Ear: External ear normal.  Eyes: EOM are normal. Pupils are equal, round, and reactive to light.  Neck: Normal range of motion. Neck supple. No thyromegaly present.  Cardiovascular: Normal rate, regular rhythm and normal heart sounds.   Pulmonary/Chest: Effort normal and breath sounds normal. No respiratory distress. She exhibits no tenderness.  Abdominal: Soft. Bowel sounds are normal. She exhibits no distension and no mass.  Lymphadenopathy:    She has no cervical adenopathy.  Neurological: She is alert and oriented to person, place, and time.  Skin: Skin is warm and dry.  Psychiatric: She has a normal mood and affect.          Assessment & Plan:  1. HLD (hyperlipidemia)  - CMP14+EGFR - Lipid panel  2. Follow-up exam, 3-6 months since previous exam  - Vit D  25 hydroxy (rtn osteoporosis monitoring)  3. Osteoporosis screening  - Vit D  25 hydroxy (rtn osteoporosis monitoring)  4. Other and unspecified hyperlipidemia  - atorvastatin  (LIPITOR) 20 MG tablet; Take 1 tablet (20 mg total) by mouth daily.  Dispense: 90 tablet; Refill: 3 -Continue all current medications Labs pending F/u in 3 months Discussed benefits of regular exercise and healthy eating Patient verbalized understanding Erby Pian, FNP-C

## 2013-06-15 LAB — VITAMIN D 25 HYDROXY (VIT D DEFICIENCY, FRACTURES): Vit D, 25-Hydroxy: 24.5 ng/mL — ABNORMAL LOW (ref 30.0–100.0)

## 2013-06-15 LAB — LIPID PANEL
Chol/HDL Ratio: 4.8 ratio units — ABNORMAL HIGH (ref 0.0–4.4)
Cholesterol, Total: 163 mg/dL (ref 100–199)
HDL: 34 mg/dL — ABNORMAL LOW (ref >39)
LDL Calculated: 72 mg/dL (ref 0–99)
Triglycerides: 283 mg/dL — ABNORMAL HIGH (ref 0–149)
VLDL Cholesterol Cal: 57 mg/dL — ABNORMAL HIGH (ref 5–40)

## 2013-06-15 LAB — CMP14+EGFR
A/G RATIO: 1.9 (ref 1.1–2.5)
ALBUMIN: 4.5 g/dL (ref 3.6–4.8)
ALK PHOS: 118 IU/L — AB (ref 39–117)
ALT: 19 IU/L (ref 0–32)
AST: 15 IU/L (ref 0–40)
BILIRUBIN TOTAL: 0.2 mg/dL (ref 0.0–1.2)
BUN/Creatinine Ratio: 25 (ref 11–26)
BUN: 15 mg/dL (ref 8–27)
CO2: 25 mmol/L (ref 18–29)
CREATININE: 0.6 mg/dL (ref 0.57–1.00)
Calcium: 9.5 mg/dL (ref 8.7–10.3)
Chloride: 103 mmol/L (ref 97–108)
GFR, EST AFRICAN AMERICAN: 114 mL/min/{1.73_m2} (ref >59)
GFR, EST NON AFRICAN AMERICAN: 99 mL/min/{1.73_m2} (ref >59)
GLOBULIN, TOTAL: 2.4 g/dL (ref 1.5–4.5)
Glucose: 87 mg/dL (ref 65–99)
Potassium: 4.7 mmol/L (ref 3.5–5.2)
SODIUM: 144 mmol/L (ref 134–144)
Total Protein: 6.9 g/dL (ref 6.0–8.5)

## 2013-06-20 ENCOUNTER — Other Ambulatory Visit: Payer: Self-pay | Admitting: General Practice

## 2013-06-20 DIAGNOSIS — E559 Vitamin D deficiency, unspecified: Secondary | ICD-10-CM

## 2013-06-20 MED ORDER — VITAMIN D3 1.25 MG (50000 UT) PO CAPS: 1.0000 | ORAL_CAPSULE | ORAL | Status: DC

## 2013-06-21 ENCOUNTER — Telehealth: Payer: Self-pay | Admitting: *Deleted

## 2013-06-21 NOTE — Telephone Encounter (Signed)
Need to discuss results and new rx.

## 2013-06-22 ENCOUNTER — Telehealth: Payer: Self-pay | Admitting: General Practice

## 2013-06-24 NOTE — Telephone Encounter (Signed)
LM.  Need to eat healthy, tryglicerides are elevated.  Vitamin D is low,script sent in.

## 2014-02-07 ENCOUNTER — Ambulatory Visit: Payer: BC Managed Care – PPO | Admitting: Family

## 2014-02-24 ENCOUNTER — Ambulatory Visit (INDEPENDENT_AMBULATORY_CARE_PROVIDER_SITE_OTHER): Payer: BC Managed Care – PPO | Admitting: Family Medicine

## 2014-02-24 VITALS — BP 136/93 | HR 92 | Temp 97.0°F | Wt 158.6 lb

## 2014-02-24 DIAGNOSIS — J069 Acute upper respiratory infection, unspecified: Secondary | ICD-10-CM

## 2014-02-24 MED ORDER — AZITHROMYCIN 250 MG PO TABS: ORAL_TABLET | ORAL | Status: DC

## 2014-02-24 NOTE — Progress Notes (Signed)
   Subjective:    Patient ID: Ann RakersDeborah J Shawhan, female    DOB: 10/18/1950, 63 y.o.   MRN: [id number]  HPI She has been having sore throat and uri sx's for over a week.   Review of Systems    No chest pain, SOB, HA, dizziness, vision change, N/V, diarrhea, constipation, dysuria, urinary urgency or frequency, myalgias, arthralgias or rash.  Objective:   Physical Exam Vital signs noted  Well developed well nourished female.  HEENT - Head atraumatic Normocephalic                Eyes - PERRLA, Conjuctiva - clear Sclera- Clear EOMI                Ears - EAC's Wnl TM's Wnl Gross Hearing WNL                Throat - oropharanx wnl Respiratory - Lungs CTA bilateral Cardiac - RRR S1 and S2 without murmur GI - Abdomen soft Nontender and bowel sounds active x 4 Extremities - No edema. Neuro - Grossly intact.       Assessment & Plan:  URI (upper respiratory infection) - Plan: azithromycin (ZITHROMAX) 250 MG tablet Push po fluids, rest, tylenol and motrin otc prn as directed for fever, arthralgias, and myalgias.  Follow up prn if sx's continue or persist.  Deatra CanterWilliam J Oxford FNP

## 2014-03-19 ENCOUNTER — Encounter: Payer: Self-pay | Admitting: Nurse Practitioner

## 2014-03-19 ENCOUNTER — Ambulatory Visit (INDEPENDENT_AMBULATORY_CARE_PROVIDER_SITE_OTHER): Payer: BC Managed Care – PPO | Admitting: Nurse Practitioner

## 2014-03-19 ENCOUNTER — Ambulatory Visit (INDEPENDENT_AMBULATORY_CARE_PROVIDER_SITE_OTHER): Payer: BC Managed Care – PPO

## 2014-03-19 VITALS — BP 141/80 | HR 89 | Temp 97.9°F | Ht 62.0 in | Wt 158.2 lb

## 2014-03-19 DIAGNOSIS — E785 Hyperlipidemia, unspecified: Secondary | ICD-10-CM

## 2014-03-19 DIAGNOSIS — F331 Major depressive disorder, recurrent, moderate: Secondary | ICD-10-CM

## 2014-03-19 DIAGNOSIS — M8580 Other specified disorders of bone density and structure, unspecified site: Secondary | ICD-10-CM

## 2014-03-19 DIAGNOSIS — R079 Chest pain, unspecified: Secondary | ICD-10-CM

## 2014-03-19 DIAGNOSIS — F418 Other specified anxiety disorders: Secondary | ICD-10-CM

## 2014-03-19 MED ORDER — ATORVASTATIN CALCIUM 20 MG PO TABS: 20.0000 mg | ORAL_TABLET | Freq: Every day | ORAL | Status: DC

## 2014-03-19 NOTE — Patient Instructions (Signed)

## 2014-03-19 NOTE — Progress Notes (Signed)
   Subjective:    Patient ID: Ann Snyder, female    DOB: 1950-11-05, 62 y.o.   MRN: [id number]   Patient here today for follow up.    Hyperlipidemia This is a chronic problem. The problem is uncontrolled. Lipid results: trig elevated    She has no history of diabetes, hypothyroidism or obesity. There are no known factors aggravating her hyperlipidemia. Current antihyperlipidemic treatment includes statins. The current treatment provides moderate improvement of lipids. Compliance problems include adherence to diet and adherence to exercise.  Risk factors for coronary artery disease include dyslipidemia and post-menopausal.  Osteopenia No meds weight bearing exercises- bone density done /82/70 Metabolic syndrome Diet control - patient does not check blood sugars at home. Depression/anxiety Patient currently not taking any meds.  Patient complaint of occasional chest pain that she rates a 2/10 that comes and goes. She states the pain does not radiate, she denies any chest tightness or pressure. She denies any history of cardiac problem.    Review of Systems  All other systems reviewed and are negative.      Objective:   Physical Exam  Constitutional: She appears well-developed and well-nourished.  HENT:  Head: Normocephalic and atraumatic.  Eyes: Conjunctivae and EOM are normal. Pupils are equal, round, and reactive to light.  Neck: Normal range of motion.  Cardiovascular: Normal rate and regular rhythm.   Pulmonary/Chest: Effort normal and breath sounds normal. No respiratory distress. She has no wheezes. She has no rales. She exhibits no tenderness.  Abdominal: Soft. She exhibits no distension. There is no tenderness. There is no rebound and no guarding.  Musculoskeletal: Normal range of motion. She exhibits no edema or tenderness.  Neurological: She is alert. She has normal reflexes. She displays normal reflexes. No cranial nerve deficit. She exhibits normal muscle tone.  Coordination normal.  Skin: Skin is warm. She is not diaphoretic.  Psychiatric: She has a normal mood and affect. Her behavior is normal. Judgment and thought content normal.    BP 141/80 mmHg  Pulse 89  Temp(Src) 97.9 F (36.6 C) (Oral)  Ht _0  (1.575 m)  Wt 158 lb 3.2 oz (71.759 kg)  BMI 28.93 kg/m2  EKG-NSR-Mary-Margaret Hassell Done, FNP  Chest x ray- normal- no acute or chronic changes-Preliminary reading by Ronnald Collum, FNP  [facility]     Assessment & Plan:  1. Hyperlipidemia Low fat diet - CMP14+EGFR - NMR, lipoprofile  2. Depression with anxiety Stress management  3. Osteopenia Weight bearing exercises - Vitamin D, 25-hydroxy  4. MDD (major depressive disorder), recurrent episode, moderate  5. Chest pain, unspecified chest pain type - EKG 12-Lead - DG Chest 2 View; Future  6. Hyperlipidemia with target LDL less than 100 Low fat diet - atorvastatin (LIPITOR) 20 MG tablet; Take 1 tablet (20 mg total) by mouth daily.  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

## 2014-03-20 ENCOUNTER — Telehealth: Payer: Self-pay | Admitting: *Deleted

## 2014-03-20 LAB — NMR, LIPOPROFILE
CHOLESTEROL: 171 mg/dL (ref 100–199)
HDL Cholesterol by NMR: 41 mg/dL (ref >39)
HDL Particle Number: 34.2 umol/L (ref >=30.5)
LDL PARTICLE NUMBER: 844 nmol/L (ref <1000)
LDL Size: 19.8 nm (ref >20.5)
LDL-C: 74 mg/dL (ref 0–99)
LP-IR Score: 72 — ABNORMAL HIGH (ref <=45)
Small LDL Particle Number: 471 nmol/L (ref <=527)
Triglycerides by NMR: 280 mg/dL — ABNORMAL HIGH (ref 0–149)

## 2014-03-20 LAB — CMP14+EGFR
ALBUMIN: 4.5 g/dL (ref 3.6–4.8)
ALT: 23 IU/L (ref 0–32)
AST: 16 IU/L (ref 0–40)
Albumin/Globulin Ratio: 1.7 (ref 1.1–2.5)
Alkaline Phosphatase: 111 IU/L (ref 39–117)
BUN/Creatinine Ratio: 21 (ref 11–26)
BUN: 15 mg/dL (ref 8–27)
CHLORIDE: 103 mmol/L (ref 97–108)
CO2: 25 mmol/L (ref 18–29)
CREATININE: 0.72 mg/dL (ref 0.57–1.00)
Calcium: 9.2 mg/dL (ref 8.7–10.3)
GFR calc Af Amer: 104 mL/min/{1.73_m2} (ref >59)
GFR calc non Af Amer: 90 mL/min/{1.73_m2} (ref >59)
GLUCOSE: 93 mg/dL (ref 65–99)
Globulin, Total: 2.6 g/dL (ref 1.5–4.5)
Potassium: 4.2 mmol/L (ref 3.5–5.2)
Sodium: 141 mmol/L (ref 134–144)
Total Bilirubin: 0.2 mg/dL (ref 0.0–1.2)
Total Protein: 7.1 g/dL (ref 6.0–8.5)

## 2014-03-20 LAB — VITAMIN D 25 HYDROXY (VIT D DEFICIENCY, FRACTURES): Vit D, 25-Hydroxy: 33.7 ng/mL (ref 30.0–100.0)

## 2014-03-20 NOTE — Telephone Encounter (Signed)
-----   [facility] from [facility] Martin, FNP sent at 03/20/2014  1:04 PM EST ----- Kidney and liver function stable Cholesterol looks good except trig are elevated- decrease carbs in diet- if sill elevated at next visit will need to go on meds Vitamin d normal Continue current meds- low fat diet and exercise and recheck in 3 months

## 2014-03-21 ENCOUNTER — Encounter: Payer: Self-pay | Admitting: Family Medicine

## 2014-03-21 NOTE — Telephone Encounter (Signed)
Patient aware.

## 2014-06-04 ENCOUNTER — Encounter: Payer: Self-pay | Admitting: Family Medicine

## 2014-06-04 ENCOUNTER — Ambulatory Visit (INDEPENDENT_AMBULATORY_CARE_PROVIDER_SITE_OTHER): Payer: 59 | Admitting: Family Medicine

## 2014-06-04 VITALS — BP 150/82 | HR 87 | Temp 97.2°F | Ht 62.0 in | Wt 160.6 lb

## 2014-06-04 DIAGNOSIS — R21 Rash and other nonspecific skin eruption: Secondary | ICD-10-CM

## 2014-06-04 DIAGNOSIS — S86811A Strain of other muscle(s) and tendon(s) at lower leg level, right leg, initial encounter: Secondary | ICD-10-CM

## 2014-06-04 DIAGNOSIS — R079 Chest pain, unspecified: Secondary | ICD-10-CM

## 2014-06-04 MED ORDER — NAPROXEN 500 MG PO TABS: 500.0000 mg | ORAL_TABLET | Freq: Two times a day (BID) | ORAL | Status: DC

## 2014-06-04 MED ORDER — NYSTATIN-TRIAMCINOLONE 100000-0.1 UNIT/GM-% EX OINT: 1.0000 "application " | TOPICAL_OINTMENT | Freq: Two times a day (BID) | CUTANEOUS | Status: DC

## 2014-06-04 NOTE — Progress Notes (Signed)
   Subjective:    Patient ID: Ann Snyder, female    DOB: 05/25/1950, 64 y.o.   MRN: [id number]  HPI Patient is here for c/o rash on bilateral legs.  She c/o left sided chest pain.  She states the pain occurred when she was at work and when she bent over.  She is chest pain free at present. She has pulled a muscle in her right calf.    Review of Systems  Constitutional: Negative for fever.  HENT: Negative for ear pain.   Eyes: Negative for discharge.  Respiratory: Negative for cough.   Cardiovascular: Negative for chest pain.  Gastrointestinal: Negative for abdominal distention.  Endocrine: Negative for polyuria.  Genitourinary: Negative for difficulty urinating.  Musculoskeletal: Negative for gait problem and neck pain.  Skin: Negative for color change and rash.  Neurological: Negative for speech difficulty and headaches.  Psychiatric/Behavioral: Negative for agitation.       Objective:    BP 150/82 mmHg  Pulse 87  Temp(Src) 97.2 F (36.2 C) (Oral)  Ht 5\' 2"  (1.575 m)  Wt 160 lb 9.6 oz (72.848 kg)  BMI 29.37 kg/m2 Physical Exam  Constitutional: She is oriented to person, place, and time. She appears well-developed and well-nourished.  HENT:  Head: Normocephalic and atraumatic.  Mouth/Throat: Oropharynx is clear and moist.  Eyes: Pupils are equal, round, and reactive to light.  Neck: Normal range of motion. Neck supple.  Cardiovascular: Normal rate and regular rhythm.   No murmur heard. Pulmonary/Chest: Effort normal and breath sounds normal.  Abdominal: Soft. Bowel sounds are normal. There is no tenderness.  Musculoskeletal:  TTP right calf muscle.  Negative Homan's  Neurological: She is alert and oriented to person, place, and time.  Skin: Skin is warm and dry.  Erythematous dry rash on thighs.  Psychiatric: She has a normal mood and affect.   EKG - NSR without acute ST-T changes.        Assessment & Plan:     ICD-9-CM ICD-10-CM   1. Rash and  nonspecific skin eruption 782.1 R21 nystatin-triamcinolone ointment (MYCOLOG)  2. Chest pain, unspecified chest pain type 786.50 R07.9 EKG 12-Lead     Ambulatory referral to Cardiology  3. Strain of calf muscle, right, initial encounter 844.8 S86.811A naproxen (NAPROSYN) 500 MG tablet   Discussed with patient need to take baby ASA po qd and if chest pain returns go to ED.  Recommend she see a cardiologist and get stress test if indicated.    No Follow-up on file.  Deatra CanterWilliam J Tais Koestner FNP

## 2014-06-09 ENCOUNTER — Telehealth: Payer: Self-pay | Admitting: *Deleted

## 2014-06-09 NOTE — Telephone Encounter (Signed)
Bill ins co denied nystatin/triamcinolone unless she has tried and failed kenolog/mycostatin cream or ointment.  Will this work if not what reasons can I use for arguments.  Thanks!

## 2014-06-10 ENCOUNTER — Other Ambulatory Visit: Payer: Self-pay | Admitting: Family Medicine

## 2014-06-10 MED ORDER — NYSTATIN 100000 UNIT/GM EX POWD: 1.0000 g | Freq: Two times a day (BID) | CUTANEOUS | Status: DC

## 2014-06-10 MED ORDER — TRIAMCINOLONE ACETONIDE 0.1 % EX CREA: 1.0000 "application " | TOPICAL_CREAM | Freq: Two times a day (BID) | CUTANEOUS | Status: DC

## 2014-06-10 NOTE — Telephone Encounter (Signed)
Kenalog and mycostatin cream set to pharmacy

## 2014-07-02 ENCOUNTER — Institutional Professional Consult (permissible substitution): Payer: Self-pay | Admitting: Internal Medicine

## 2014-07-30 ENCOUNTER — Encounter: Payer: Self-pay | Admitting: Internal Medicine

## 2014-07-30 ENCOUNTER — Ambulatory Visit (INDEPENDENT_AMBULATORY_CARE_PROVIDER_SITE_OTHER): Payer: 59 | Admitting: Internal Medicine

## 2014-07-30 VITALS — BP 138/84 | HR 85 | Ht 63.0 in | Wt 162.0 lb

## 2014-07-30 DIAGNOSIS — R0789 Other chest pain: Secondary | ICD-10-CM | POA: Diagnosis not present

## 2014-07-30 DIAGNOSIS — R0683 Snoring: Secondary | ICD-10-CM | POA: Diagnosis not present

## 2014-07-30 DIAGNOSIS — R072 Precordial pain: Secondary | ICD-10-CM

## 2014-07-30 NOTE — Progress Notes (Signed)
Primary Care Physician: Bennie PieriniMARTIN,Ann MARGARET, FNP Referring Physician: Deatra Snyder, Ann J, FNP   Regan RakersDeborah J Snyder is a 64 y.o. female with a h/o hyperlipemia that is here today for evaluation of chest pain. She reports occurrence of chest pain for 2-3 years. Has had negative stress tests in the  past but has been several years since her last test. The pattern of chest pain is unchanged with sharp fleeting chest pains lasting less that one minute in the center of the  sternum, without radiation. There is no associated dyspnea or diaphoresis. She can continue with her normal activities without having to stop and rest. Pains occur at random, not exertional in nature. Does not appear to be associated with eating. No associated palpitations. Her children has told her that she snores. Sometimes she feels like she does wake up not feeling rested.  Today, she denies symptoms of palpitations,shortness of breath, orthopnea, PND, lower extremity edema, dizziness, presyncope, syncope, or neurologic sequela. The patient is tolerating medications without difficulties and is otherwise without complaint today.   Past Medical History  Diagnosis Date  . Anxiety   . Depression   . Headache(784.0)   . High cholesterol   . Fungal infection     right big toe   Past Surgical History  Procedure Laterality Date  . Cesarean section      3 times    Current Outpatient Prescriptions  Medication Sig Dispense Refill  . atorvastatin (LIPITOR) 20 MG tablet Take 1 tablet (20 mg total) by mouth daily. 90 tablet 3  . Multiple Vitamins-Minerals (CENTRUM SILVER PO) Take 1 capsule by mouth daily.     . naftifine (NAFTIN) 1 % cream Apply 1 application topically daily.    . naproxen (NAPROSYN) 500 MG tablet Take 1 tablet (500 mg total) by mouth 2 (two) times daily with a meal. (Patient taking differently: Take 500 mg by mouth 2 (two) times daily as needed (pain). ) 20 tablet 0   No current facility-administered medications  for this visit.    No Known Allergies  History   Social History  . Marital Status: Widowed    Spouse Name: N/A  . Number of Children: N/A  . Years of Education: N/A   Occupational History  . Not on file.   Social History Main Topics  . Smoking status: Never Smoker   . Smokeless tobacco: Never Used  . Alcohol Use: No  . Drug Use: No  . Sexual Activity: Not on file   Other Topics Concern  . Not on file   Social History Narrative    Family History  Problem Relation Age of Onset  . Anxiety disorder Father   . ADD / ADHD Neg Hx   . Alcohol abuse Neg Hx   . Drug abuse Neg Hx   . Bipolar disorder Neg Hx   . Depression Neg Hx   . Dementia Neg Hx   . OCD Neg Hx   . Paranoid behavior Neg Hx   . Schizophrenia Neg Hx   . Seizures Neg Hx   . Physical abuse Neg Hx   . Sexual abuse Neg Hx     ROS- All systems are reviewed and negative except as per the HPI above  Physical Exam: Filed Vitals:   07/30/14 0857  BP: 138/84  Pulse: 85  Height: 5\' 3"  (1.6 m)  Weight: 162 lb (73.483 kg)    GEN- The patient is well appearing, alert and oriented x 3 today.   Head- normocephalic,  atraumatic Eyes-  Sclera clear, conjunctiva pink Ears- hearing intact Oropharynx- clear Neck- supple, no JVP Lymph- no cervical lymphadenopathy Lungs- Clear to ausculation bilaterally, normal work of breathing Heart- Regular rate and rhythm, no murmurs, rubs or gallops, PMI not laterally displaced GI- soft, NT, ND, + BS Extremities- no clubbing, cyanosis, or edema MS- no significant deformity or atrophy Skin- no rash or lesion Psych- euthymic mood, full affect Neuro- strength and sensation are intact  EKG-NSR, normal ekg Epic records reviewed Lipid panel 2/15- Cholesterol, Total 100 - 199 mg/dL 956      Triglycerides 0 - 149 mg/dL 213 (H) 086 (H)R    HDL >39 mg/dL 34 (L) 39 mg/dL" VHQIO="NG_29" style="cursor: pointer;" onmouseover='jscript: var varStyle="underline"; var  bgColor="#[id number]"; this.style.backgroundColor=bgColor; var children=this.getElementsByTagName("div"); for(var child=0;child 30 (L)   Comments: According to ATP-III Guidelines, HDL-C >59 mg/dL is considered a negative risk factor for CHD.    VLDL Cholesterol Cal 5 - 40 mg/dL 57 (H)     LDL Calculated 0 - 99 mg/dL 72 52WU    Chol/HDL Ratio 0.0 - 4.4 ratio units 4.8 (H)        CMP drawn 06/14/13 remarkable only for elevated alkaline phosphatase at 118.    Assessment and Plan: 1. Chest pain Does not sound to be cardiac in origin but will obtain a GXT to further evaluate.  2. History of snoring/ fatigue Sleep study ordered  If stress test is abnormal, will call back to office for f/u, otherwise, can see as needed.

## 2014-07-30 NOTE — Patient Instructions (Signed)
Your physician recommends that you schedule a follow-up appointment as needed.  Will call you with test results  Your physician has recommended that you have a sleep study. This test records several body functions during sleep, including: brain activity, eye movement, oxygen and carbon dioxide blood levels, heart rate and rhythm, breathing rate and rhythm, the flow of air through your mouth and nose, snoring, body muscle movements, and chest and belly movement.   Your physician has requested that you have an exercise tolerance test. For further information please visit [email or web address]. Please also follow instruction sheet, as given.

## 2014-07-31 DIAGNOSIS — R0683 Snoring: Secondary | ICD-10-CM | POA: Insufficient documentation

## 2014-07-31 DIAGNOSIS — R079 Chest pain, unspecified: Secondary | ICD-10-CM | POA: Insufficient documentation

## 2014-09-11 ENCOUNTER — Ambulatory Visit (INDEPENDENT_AMBULATORY_CARE_PROVIDER_SITE_OTHER): Payer: 59

## 2014-09-11 ENCOUNTER — Encounter: Payer: 59 | Admitting: Nurse Practitioner

## 2014-09-11 ENCOUNTER — Encounter: Payer: Self-pay | Admitting: Nurse Practitioner

## 2014-09-11 VITALS — BP 161/92 | HR 83

## 2014-09-11 DIAGNOSIS — R0789 Other chest pain: Secondary | ICD-10-CM

## 2014-09-11 DIAGNOSIS — R0683 Snoring: Secondary | ICD-10-CM

## 2014-09-11 LAB — EXERCISE TOLERANCE TEST
CHL CUP MPHR: 158 {beats}/min
CSEPPHR: 150 {beats}/min
Estimated workload: 10 METS
Exercise duration (min): 8 min
Percent HR: 94 %
RPE: 14
Rest HR: 83 {beats}/min

## 2014-09-11 NOTE — Progress Notes (Unsigned)
Patient presents today for routine GXT. Has had atypical chest pain. Seen for Dr. Johney FrameAllred. Has HLD - no known HTN. No regular exercise.    Today the patient exercised on the standard Bruce protocol for a total of 8 minutes.  Good/fair exercise tolerance.  Mildly hypertensive blood pressure response.  Clinically negative for chest pain. Test was stopped due to achievement of target HR/dyspnea.  EKG negative for ischemia. No significant arrhythmia noted.   Recommendation:  CV risk factor modification BP diary - can have staff at [facility] where she works check her BP. She will follow up with PCP if readings consistently above 140/90. See back as needed  Patient is agreeable to this plan and will call if any problems develop in the interim.   Ann MacadamiaLori C. Porscha Axley, RN, ANP-C [facility] [address] [place], [place]  [id number] [phone]     This encounter was created in error - please disregard. This encounter was created in error - please disregard.

## 2014-09-24 ENCOUNTER — Telehealth: Payer: Self-pay | Admitting: Nurse Practitioner

## 2014-09-24 NOTE — Telephone Encounter (Signed)
New message     Returning a nurses call.  Please call at 4:00 or after

## 2014-10-01 ENCOUNTER — Ambulatory Visit (HOSPITAL_BASED_OUTPATIENT_CLINIC_OR_DEPARTMENT_OTHER): Payer: 59 | Attending: Internal Medicine | Admitting: Radiology

## 2014-10-01 VITALS — Ht 63.0 in | Wt 160.0 lb

## 2014-10-01 DIAGNOSIS — G4733 Obstructive sleep apnea (adult) (pediatric): Secondary | ICD-10-CM

## 2014-10-01 DIAGNOSIS — G4710 Hypersomnia, unspecified: Secondary | ICD-10-CM | POA: Diagnosis present

## 2014-10-01 DIAGNOSIS — R0683 Snoring: Secondary | ICD-10-CM

## 2014-10-07 ENCOUNTER — Encounter (HOSPITAL_BASED_OUTPATIENT_CLINIC_OR_DEPARTMENT_OTHER): Payer: Self-pay | Admitting: Radiology

## 2014-10-07 ENCOUNTER — Telehealth: Payer: Self-pay | Admitting: Cardiology

## 2014-10-07 DIAGNOSIS — G4733 Obstructive sleep apnea (adult) (pediatric): Secondary | ICD-10-CM

## 2014-10-07 HISTORY — DX: Obstructive sleep apnea (adult) (pediatric): G47.33

## 2014-10-07 NOTE — Sleep Study (Signed)
   NAME: Ann RakersDeborah J Snyder DATE OF BIRTH:  10/11/1950 MEDICAL RECORD NUMBER [id number]  LOCATION: [facility]  PHYSICIAN: TURNER,TRACI R  DATE OF STUDY: 10/01/2014  SLEEP STUDY TYPE: Nocturnal Polysomnogram               REFERRING PHYSICIAN: Hillis RangeAllred, James, MD  INDICATION FOR STUDY: Snoring and excessive daytime sleepiness  EPWORTH SLEEPINESS SCORE: 2 HEIGHT: 5\' 3"  (160 cm)  WEIGHT: 160 lb (72.576 kg)    Body mass index is 28.35 kg/(m^2).  NECK SIZE: 15 in.  MEDICATIONS: Reviewed in the chart  SLEEP ARCHITECTURE: The patient slept for a total of 288 minutes out of a total sleep period time of 343 minutes.  There was no slow wave sleep and 23 minutes of REM sleep.  The onset to sleep latency was prolonged at 56 minutes.  The onset to REM sleep latency was prolonged at 172 minutes.  The sleep efficiency was reduced at 72%.    RESPIRATORY DATA: There were 21 apneas, of which, 19 were obstructive and 2 were central.  There were 81 hypopneas.  The overall AHI was 21 events per hour consistent with moderate obstructive sleep apnea/hypopea syndrome.  Most events occurred in the supine position and during NREM sleep.  There was moderate to severe snoring.  OXYGEN DATA: The average oxygen saturation was 94%.  The lowest oxygen saturation was 79%.  The time spent with oxygen saturations <88% was 4 minutes.   CARDIAC DATA: The patient maintained NSR with PAC's during the study.  The average heart rate was 68 bpm and the lowest heart rate was 46 bpm.  MOVEMENT/PARASOMNIA: There were no limb movements or REM sleep behavior disorders noted.    IMPRESSION/ RECOMMENDATION:   1.  Moderate obstructive sleep apnea/hypopnea syndrome with an AHI of 21 events per hour.   Most events occurred in the supine position and during NREM sleep.  2.  Moderate to severe snoring was noted. 3.  Reduced sleep efficiency with increased frequency of arousals due to respiratory events.   4.  Abnormal  sleep architecture with prolonged onset to REM sleep and no slow wave sleep. 5.  Oxygen desaturations as low as 79% with respiratory events.   6.  Given the degree of sleep disordered breathing with excessive daytime sleepiness and oxygen desaturations, recommend a trial of CPAP therapy.  Other treatment options include referral to ENT for evaluation of surgical causes of sleep apnea or referral to dentistry for oral device. 7.  The patient should be counseled in good sleep hygiene and avoid sleeping in the supine position.  Signed: Quintella ReichertURNER,TRACI R Diplomate, American Board of Sleep Medicine  ELECTRONICALLY SIGNED ON:  10/07/2014, 3:19 PM [facility] PH: [phone]   FX: [phone] ACCREDITED BY THE AMERICAN ACADEMY OF SLEEP MEDICINE

## 2014-10-07 NOTE — Telephone Encounter (Signed)
Please let patient know that they have sleep apnea and recommend CPAP titration. Please set up titration in the sleep lab. 

## 2014-10-08 NOTE — Telephone Encounter (Signed)
Left message to call back  

## 2014-10-10 NOTE — Telephone Encounter (Signed)
Left message to call back  

## 2014-10-16 ENCOUNTER — Encounter: Payer: Self-pay | Admitting: *Deleted

## 2014-10-16 NOTE — Telephone Encounter (Signed)
Patient is aware of results.

## 2014-10-16 NOTE — Telephone Encounter (Signed)
Titration scheduled for 11/21/14. Sent a letter to patient to let her know appt date and time.

## 2014-10-16 NOTE — Addendum Note (Signed)
Addended by: Arcola Jansky on: 10/16/2014 10:32 AM   Modules accepted: Orders

## 2014-11-21 ENCOUNTER — Ambulatory Visit (HOSPITAL_BASED_OUTPATIENT_CLINIC_OR_DEPARTMENT_OTHER): Payer: 59 | Attending: Cardiology

## 2014-11-21 VITALS — Ht 63.0 in | Wt 162.0 lb

## 2014-11-21 DIAGNOSIS — R0683 Snoring: Secondary | ICD-10-CM | POA: Insufficient documentation

## 2014-11-21 DIAGNOSIS — G4730 Sleep apnea, unspecified: Secondary | ICD-10-CM | POA: Diagnosis present

## 2014-11-21 DIAGNOSIS — G4733 Obstructive sleep apnea (adult) (pediatric): Secondary | ICD-10-CM | POA: Insufficient documentation

## 2014-11-30 IMAGING — MG MM DIGITAL DIAGNOSTIC BILAT
5 series · 5 of 5 positions shown · non-contrast
Comparison: 04/02/2007.

CLINICAL DATA: The patient notes palpable area of superficial
nodularity within the right axilla.

DIGITAL DIAGNOSTIC BILATERAL MAMMOGRAM WITH CAD AND RIGHT BREAST
ULTRASOUND:

[R CC]
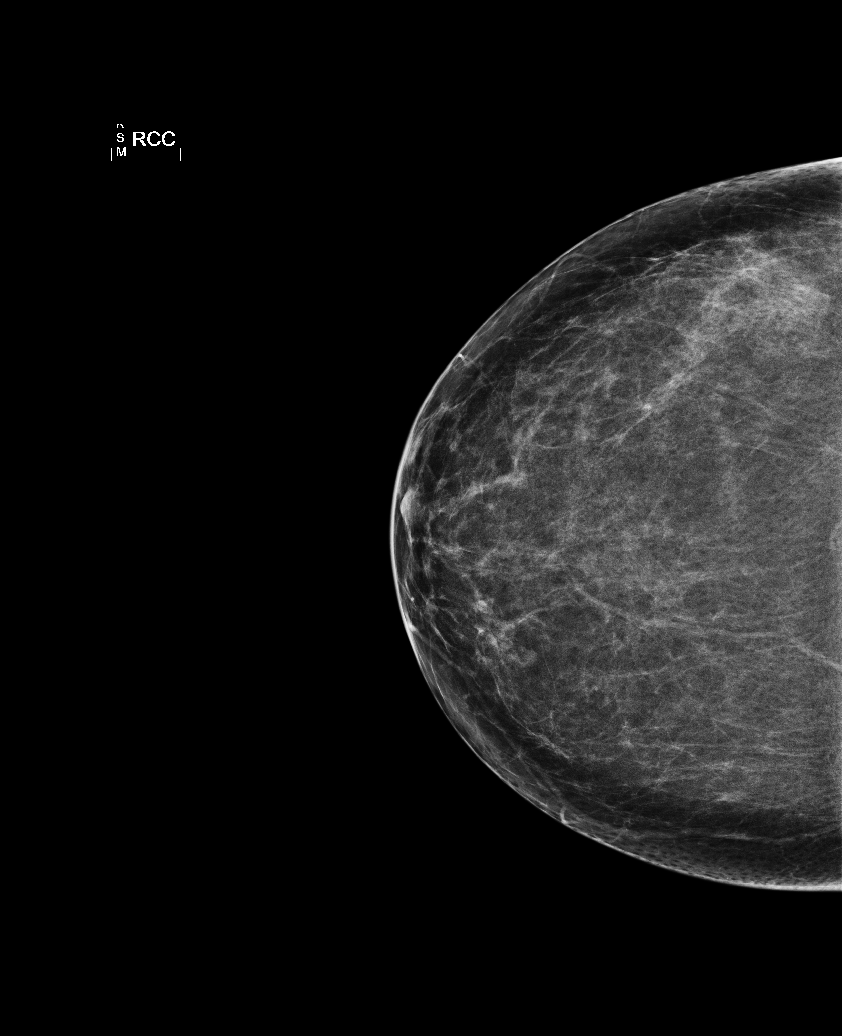

[L CC]
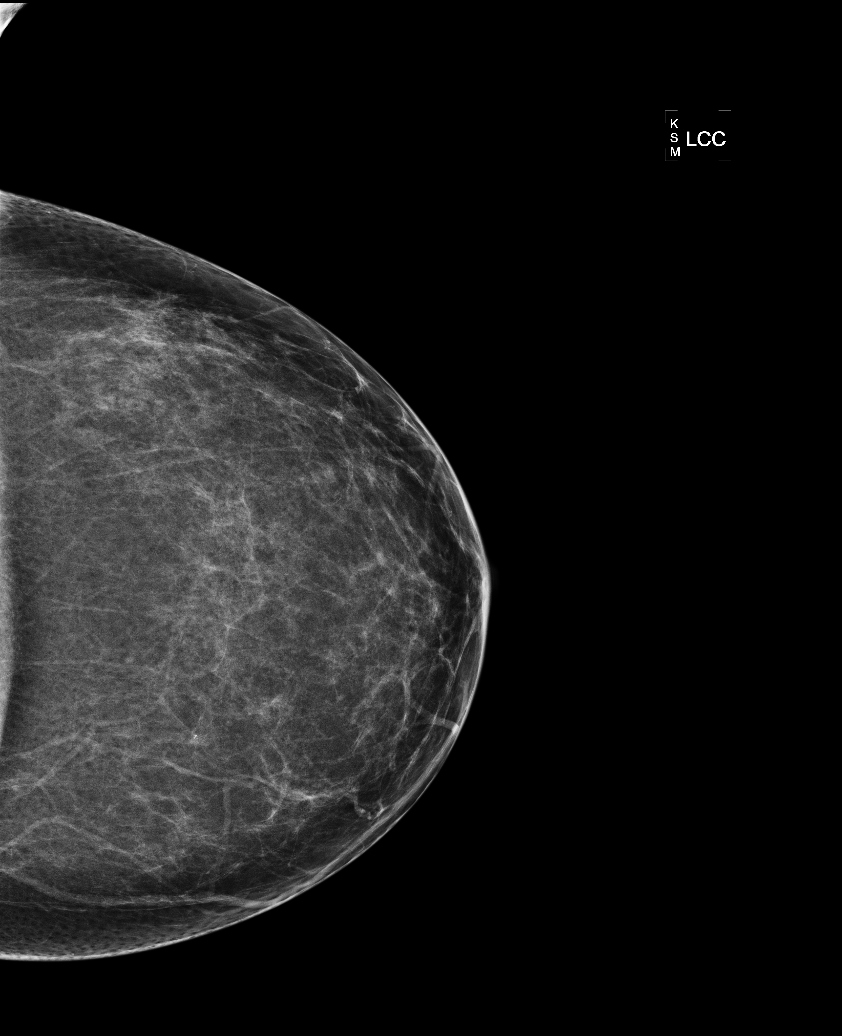

[L MLO]
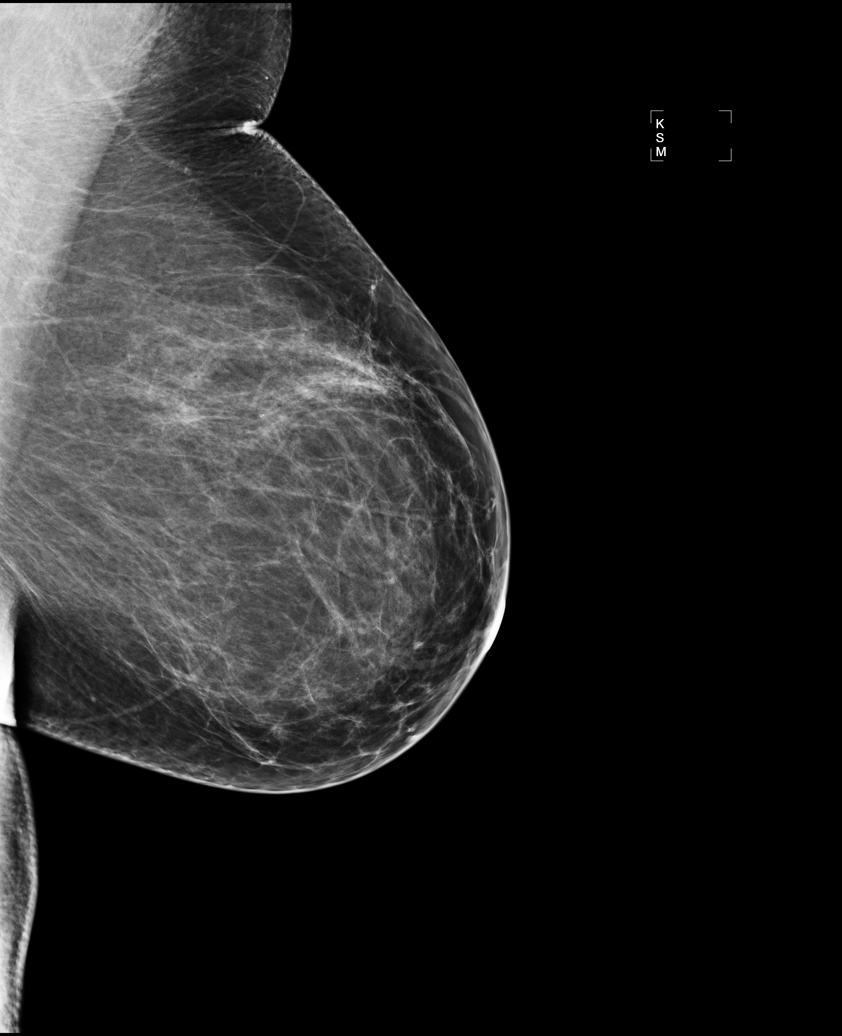

[R MLO]
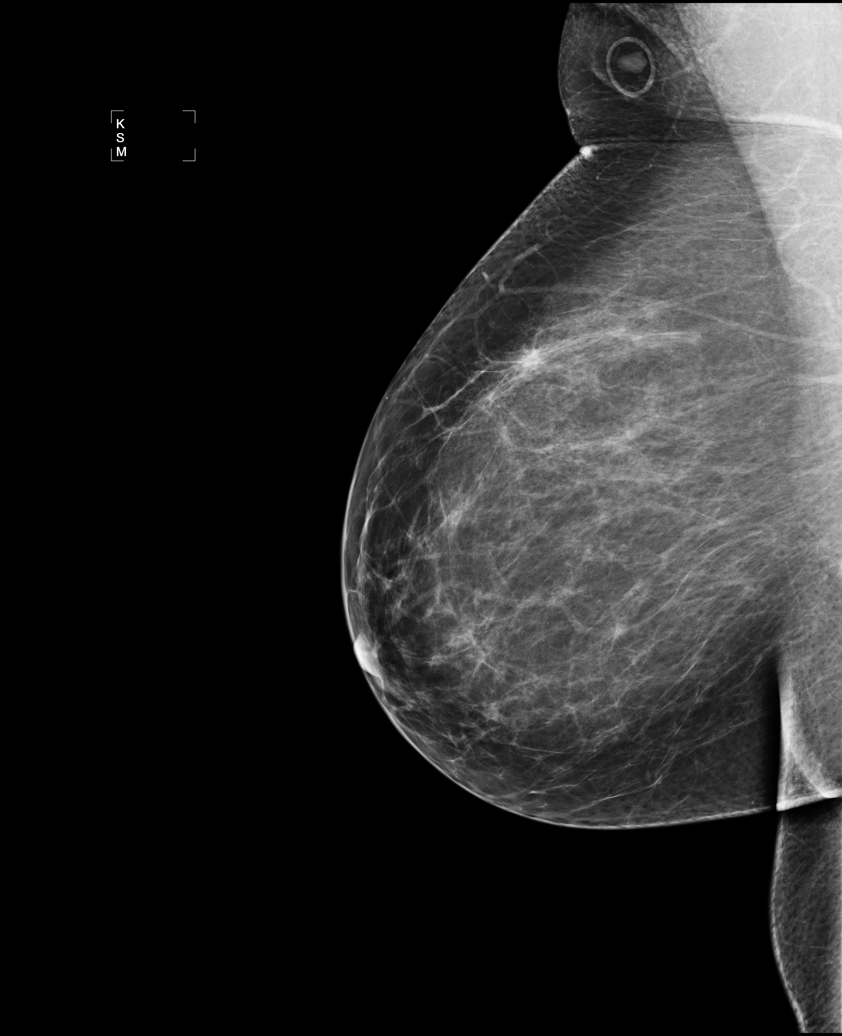

[R TAN]
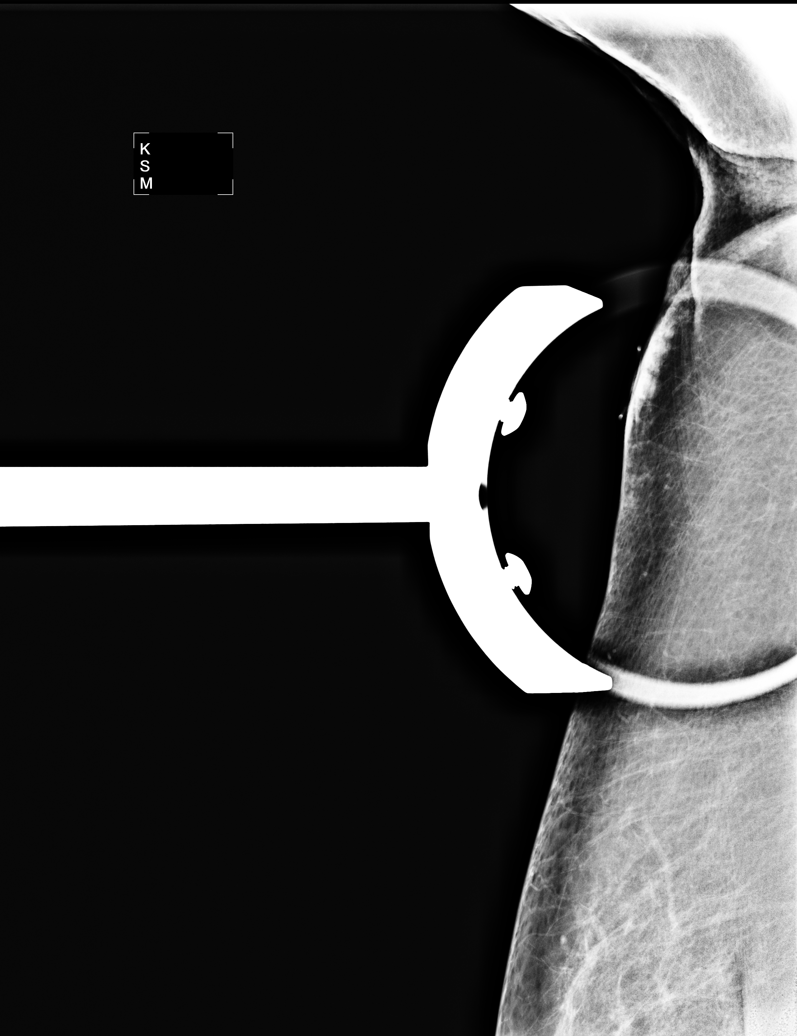

[5 of 5 positions shown; findings below may reference images not displayed]

FINDINGS: ACR Breast Density Category 2: There is a scattered fibroglandular
pattern.

There is no mass, distortion, or worrisome calcification within
either breast.

Mammographic images were processed with CAD.

On physical exam, there is a superficial palpable nodule located
within the right axilla in a sub dermal location.  By physical
examination this most likely represents a thrombosed superficial
vein.

Ultrasound is performed, showing a sonolucent structure measuring
8 mm in greatest dimension most consistent with a probable
thrombosed superficial vein.  There is a vessel seen adjacent to
this nodule.  There are no additional findings.
IMPRESSION: Finding most consistent with a thrombosed superficial vein within
the right axilla.  No findings worrisome for malignancy.  Recommend
screening mammography in 1 year.

RECOMMENDATION:
Bilateral screening mammography in 1 year.

I have discussed the findings and recommendations with the patient.
Results were also provided in writing at the conclusion of the
visit.  If applicable, a reminder letter will be sent to the
patient regarding the next appointment.

BI-RADS CATEGORY 2:  Benign finding(s).

## 2014-12-14 ENCOUNTER — Telehealth: Payer: Self-pay | Admitting: Cardiology

## 2014-12-14 NOTE — Sleep Study (Signed)
SLEEP STUDY   Patient Name: Ann Snyder, Ann Snyder Date: 11/21/2014 MRN: [id number] Gender: Female D.O.B: 1951-01-30 Age (years): 76 Referring Provider: Armanda Magic MD, ABSM Interpreting Physician: Armanda Magic MD, ABSM RPSGT: Melburn Popper  Height (inches): 63 Weight (lbs): 162 BMI: 29 Neck Size: 15.00  CLINICAL INFORMATION The patient is referred for a CPAP titration to treat sleep apnea.  Date of NPSG, Split Night or HST:10/07/2014  SLEEP STUDY TECHNIQUE As per the AASM Manual for the Scoring of Sleep and Associated Events v2.3 (April 2016) with a hypopnea requiring 4% desaturations.  The channels recorded and monitored were frontal, central and occipital EEG, electrooculogram (EOG), submentalis EMG (chin), nasal and oral airflow, thoracic and abdominal wall motion, anterior tibialis EMG, snore microphone, electrocardiogram, and pulse oximetry. Continuous positive airway pressure (CPAP) was initiated at the beginning of the study and titrated to treat sleep-disordered breathing.  MEDICATIONS Medications taken by the patient : Lipitor and Naproxen Medications administered by patient during sleep study : No sleep medicine administered.  TECHNICIAN COMMENTS Comments added by technician: NONE  Comments added by scorer: N/A  RESPIRATORY PARAMETERS Optimal PAP Pressure (cm): 14  AHI at Optimal Pressure (/hr):0.0 Overall Minimal O2 (%):86.00   Supine % at Optimal Pressure (%):100 Minimal O2 at Optimal Pressure (%):94.0    SLEEP ARCHITECTURE The study was initiated at 11:12:16 PM and ended at 5:16:16 AM.  Sleep onset time was 9.8 minutes and the sleep efficiency was reduced at 76.5%. The total sleep time was 278.5 minutes.  The patient spent 5.92% of the night in stage N1 sleep, 44.17% in stage N2 sleep, 20.29% in stage N3 and 29.62% in REM.Stage REM latency was 88.5 minutes  Wake after sleep onset was 75.7. Alpha intrusion was absent. Supine sleep was  54.04%.  CARDIAC DATA The 2 lead EKG demonstrated sinus rhythm. The mean heart rate was 74.49 beats per minute. Other EKG findings include: None.  LEG MOVEMENT DATA The total Periodic Limb Movements of Sleep (PLMS) were 8. The PLMS index was 1.72. A PLMS index of <15 is considered normal in adults.  IMPRESSIONS The optimal PAP pressure was 14 cm of water. Central sleep apnea was not noted during this titration (CAI = 0.2/h). Moderate oxygen desaturations were observed during this titration (min O2 = 86.00%). The patient snored with Soft snoring volume during this titration study. No cardiac abnormalities were observed during this study. Clinically significant periodic limb movements were not noted during this study. Arousals associated with PLMs were rare.  DIAGNOSIS Obstructive Sleep Apnea (327.23 [G47.33 ICD-10])  RECOMMENDATIONS CPAP therapy on 14 cm H2O with a Small size Fisher&Paykel Full Face Mask Simplus mask and heated humidification. Avoid alcohol, sedatives and other CNS depressants that may worsen sleep apnea and disrupt normal sleep architecture. Sleep hygiene should be reviewed to assess factors that may improve sleep quality. Weight management and regular exercise should be initiated or continued. Return to Sleep provider for re-evaluation after 10 weeks of therapy  Signed: Armanda Magic, MD ABSM Diplomate, American Board of Sleep Medicine 12/14/2014

## 2014-12-14 NOTE — Telephone Encounter (Signed)
Pt had successful PAP titration. Please setup appointment in 10 weeks. Please let [facility] know that order for PAP is in EPIC.   

## 2014-12-14 NOTE — Addendum Note (Signed)
Addended by: Quintella Reichert on: 12/14/2014 06:18 PM   Modules accepted: Orders

## 2014-12-15 NOTE — Telephone Encounter (Signed)
Patient is aware of results. [facility] has been notified.   Once she has her machine we will schedule 10 week follow-up

## 2015-01-20 ENCOUNTER — Encounter: Payer: Self-pay | Admitting: Family Medicine

## 2015-01-20 ENCOUNTER — Ambulatory Visit (INDEPENDENT_AMBULATORY_CARE_PROVIDER_SITE_OTHER): Payer: Managed Care, Other (non HMO) | Admitting: Family Medicine

## 2015-01-20 VITALS — BP 152/90 | HR 78 | Temp 97.0°F | Ht 63.0 in | Wt 158.6 lb

## 2015-01-20 DIAGNOSIS — R030 Elevated blood-pressure reading, without diagnosis of hypertension: Secondary | ICD-10-CM | POA: Diagnosis not present

## 2015-01-20 DIAGNOSIS — E785 Hyperlipidemia, unspecified: Secondary | ICD-10-CM

## 2015-01-20 DIAGNOSIS — Z0000 Encounter for general adult medical examination without abnormal findings: Secondary | ICD-10-CM

## 2015-01-20 DIAGNOSIS — G4733 Obstructive sleep apnea (adult) (pediatric): Secondary | ICD-10-CM

## 2015-01-20 HISTORY — DX: Elevated blood-pressure reading, without diagnosis of hypertension: R03.0

## 2015-01-20 NOTE — Patient Instructions (Signed)
Great to meet you!  Call the breast center today to make your appointment , [phone]   I agree with your heart doctor, walk 30 minutes a day 5 days a week. Start with a goal of 3 days a week  Come back in 4-6 weeks with your blood pressure log

## 2015-01-20 NOTE — Progress Notes (Signed)
   HPI  Patient presents today for annual exam  She is doing well, chest pain has been evaluated by careds and a stress test was negative for ischemia.  SHe has had momentary mid chest dull pain with no associated symptoms, non exertional over 6 weeks ago.   Active at work but no formal exercise Avoids fried and fattyfoods 2 meals a day  Had a dizzy spell lasting moments with room spinning sensation 1 day ago, only   Episode and resolved with standing.   No dyspnea, palps, leg edema Does have fatigue  PMH: Smoking status noted ROS: Per HPI  Objective: BP 152/90 mmHg  Pulse 78  Temp(Src) 97 F (36.1 C) (Oral)  Ht $R'5\' 3"'HY$  (1.6 m)  Wt 158 lb 9.6 oz (71.94 kg)  BMI 28.10 kg/m2 Gen: NAD, alert, cooperative with exam HEENT: NCAT, , nares/oropharynx and  Ears clear Neck- no thyromegaly or bruits CV: RRR, good S1/S2, no murmur Resp: CTABL, no wheezes, non-labored Ext: No edema, warm Neuro: Alert and oriented, No gross deficits  Assessment and plan:  # HCM, annual exam Needs mammo, given number to schedule Fasting labs, TSH with fatigue Up to date on c scope  # chest pain Rare, non cardiac etiology POssible esophageal spasm No Tx for now, reasurance   # elevated BP, no HTN Dx yet Given log, no red flags RTC 4-6 weeks, possibly start Ace or ARB then  # HLD Tolerating statin, good compliance Continue, Fasting lab today Encouraged exercise and diet improvements  Orders Placed This Encounter  Procedures  . CMP14+EGFR  . CBC with Differential  . TSH  . T4, Free  . Lipid Panel  . HIV antibody  . Hepatitis C antibody    Meds ordered this encounter  Medications  . Cyanocobalamin (B-12 PO)    Sig: Take by mouth.    Laroy Apple, MD [facility] 01/20/2015, 10:35 AM

## 2015-01-21 ENCOUNTER — Other Ambulatory Visit: Payer: Self-pay | Admitting: Family Medicine

## 2015-01-21 DIAGNOSIS — E1169 Type 2 diabetes mellitus with other specified complication: Secondary | ICD-10-CM | POA: Insufficient documentation

## 2015-01-21 DIAGNOSIS — E781 Pure hyperglyceridemia: Secondary | ICD-10-CM | POA: Insufficient documentation

## 2015-01-21 LAB — CBC WITH DIFFERENTIAL/PLATELET
BASOS: 1 %
Basophils Absolute: 0 10*3/uL (ref 0.0–0.2)
EOS (ABSOLUTE): 0.2 10*3/uL (ref 0.0–0.4)
Eos: 4 %
Hematocrit: 39.5 % (ref 34.0–46.6)
Hemoglobin: 12.8 g/dL (ref 11.1–15.9)
Immature Grans (Abs): 0 10*3/uL (ref 0.0–0.1)
Immature Granulocytes: 0 %
LYMPHS ABS: 2.2 10*3/uL (ref 0.7–3.1)
Lymphs: 37 %
MCH: 27.5 pg (ref 26.6–33.0)
MCHC: 32.4 g/dL (ref 31.5–35.7)
MCV: 85 fL (ref 79–97)
MONOS ABS: 0.4 10*3/uL (ref 0.1–0.9)
Monocytes: 7 %
NEUTROS ABS: 3.1 10*3/uL (ref 1.4–7.0)
Neutrophils: 51 %
Platelets: 238 10*3/uL (ref 150–379)
RBC: 4.66 x10E6/uL (ref 3.77–5.28)
RDW: 14.5 % (ref 12.3–15.4)
WBC: 6 10*3/uL (ref 3.4–10.8)

## 2015-01-21 LAB — CMP14+EGFR
ALT: 29 IU/L (ref 0–32)
AST: 21 IU/L (ref 0–40)
Albumin/Globulin Ratio: 1.7 (ref 1.1–2.5)
Albumin: 4.4 g/dL (ref 3.6–4.8)
Alkaline Phosphatase: 104 IU/L (ref 39–117)
BUN/Creatinine Ratio: 24 (ref 11–26)
BUN: 16 mg/dL (ref 8–27)
Bilirubin Total: 0.3 mg/dL (ref 0.0–1.2)
CALCIUM: 9.3 mg/dL (ref 8.7–10.3)
CO2: 23 mmol/L (ref 18–29)
Chloride: 105 mmol/L (ref 97–108)
Creatinine, Ser: 0.66 mg/dL (ref 0.57–1.00)
GFR, EST AFRICAN AMERICAN: 109 mL/min/{1.73_m2} (ref >59)
GFR, EST NON AFRICAN AMERICAN: 94 mL/min/{1.73_m2} (ref >59)
GLOBULIN, TOTAL: 2.6 g/dL (ref 1.5–4.5)
Glucose: 94 mg/dL (ref 65–99)
POTASSIUM: 4.7 mmol/L (ref 3.5–5.2)
SODIUM: 143 mmol/L (ref 134–144)
TOTAL PROTEIN: 7 g/dL (ref 6.0–8.5)

## 2015-01-21 LAB — LIPID PANEL
Chol/HDL Ratio: 4.4 ratio (ref 0.0–4.4)
Cholesterol, Total: 167 mg/dL (ref 100–199)
HDL: 38 mg/dL — ABNORMAL LOW
LDL Calculated: 68 mg/dL (ref 0–99)
Triglycerides: 304 mg/dL — ABNORMAL HIGH (ref 0–149)
VLDL Cholesterol Cal: 61 mg/dL — ABNORMAL HIGH (ref 5–40)

## 2015-01-21 LAB — TSH: TSH: 1.69 u[IU]/mL (ref 0.450–4.500)

## 2015-01-21 LAB — T4, FREE: Free T4: 1.03 ng/dL (ref 0.82–1.77)

## 2015-01-21 LAB — HIV ANTIBODY (ROUTINE TESTING W REFLEX): HIV SCREEN 4TH GENERATION: NONREACTIVE

## 2015-01-21 LAB — HEPATITIS C ANTIBODY: Hep C Virus Ab: <0.1 {s_co_ratio} (ref 0.0–0.9)

## 2015-01-21 MED ORDER — ICOSAPENT ETHYL 1 G PO CAPS: 2.0000 g | ORAL_CAPSULE | Freq: Two times a day (BID) | ORAL | Status: DC

## 2015-04-05 ENCOUNTER — Other Ambulatory Visit: Payer: Self-pay | Admitting: Nurse Practitioner

## 2015-05-08 LAB — HM MAMMOGRAPHY: HM MAMMO: NEGATIVE

## 2015-05-14 ENCOUNTER — Ambulatory Visit (INDEPENDENT_AMBULATORY_CARE_PROVIDER_SITE_OTHER): Payer: Managed Care, Other (non HMO)

## 2015-05-14 ENCOUNTER — Encounter: Payer: Self-pay | Admitting: Family Medicine

## 2015-05-14 ENCOUNTER — Ambulatory Visit (INDEPENDENT_AMBULATORY_CARE_PROVIDER_SITE_OTHER): Payer: Managed Care, Other (non HMO) | Admitting: Family Medicine

## 2015-05-14 ENCOUNTER — Other Ambulatory Visit: Payer: Self-pay | Admitting: Family Medicine

## 2015-05-14 VITALS — BP 136/76 | HR 90 | Temp 97.0°F | Ht 63.0 in | Wt 160.8 lb

## 2015-05-14 DIAGNOSIS — W19XXXA Unspecified fall, initial encounter: Secondary | ICD-10-CM

## 2015-05-14 DIAGNOSIS — M25561 Pain in right knee: Secondary | ICD-10-CM | POA: Diagnosis not present

## 2015-05-14 MED ORDER — DICLOFENAC SODIUM 75 MG PO TBEC: 75.0000 mg | DELAYED_RELEASE_TABLET | Freq: Two times a day (BID) | ORAL | Status: DC

## 2015-05-14 NOTE — Patient Instructions (Signed)
Great to see you again!  Take the medicine twice daily for 5 days Try ice for 15 minutes, 2-3 times daily Use compression on your knee ( a knee sleeve is perfect but even an ace bandage will do) while you are on your fee this weekend.

## 2015-05-14 NOTE — Progress Notes (Signed)
   HPI  Patient presents today hear to discuss knee pain.  She fell 1 day ago while going down some steps landing directly on her R patella.   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 She thinks she will be ok to work tomorrow but wanted to get checked out to be sure  Elevated BP BP log shows avg systolic in 130s NO chest pain, dyspnea, or leg swelling (apart form injury)   PMH: Smoking status noted ROS: Per HPI  Objective: BP 136/76 mmHg  Pulse 90  Temp(Src) 97 F (36.1 C) (Oral)  Ht 5\' 3"  (1.6 m)  Wt 160 lb 12.8 oz (72.938 kg)  BMI 28.49 kg/m2 Gen: NAD, alert, cooperative with exam HEENT: NCAT CV: RRR, good S1/S2, no murmur Resp: CTABL, no wheezes, non-labored Ext: No edema, warm Neuro: Alert and oriented, No gross deficits  MSK: R knee without erythema, bruising, or gross deformity Small effusion No joint line tenderness.  ligamentously intact to Lachman's and with varus and valgus stress.  Mild pain with mcmurray's on the lateral side  Knee film- no acute, mild sclerosis  Assessment and plan:  # Knee pain, traumatic Mild effusion, no ligamet damage Recommended ice, rest, compression and scheduled NSAIDs (5 days) Out of work for 1-3 days RTC if worsening or not improved as expected    Meds ordered this encounter  Medications  . diclofenac (VOLTAREN) 75 MG EC tablet    Sig: Take 1 tablet (75 mg total) by mouth 2 (two) times daily.    Dispense:  20 tablet    Refill:  0    Murtis SinkSam Anwyn Kriegel, MD [facility] 05/14/2015, 4:49 PM

## 2015-05-21 ENCOUNTER — Ambulatory Visit: Payer: Managed Care, Other (non HMO) | Admitting: Family Medicine

## 2015-05-22 ENCOUNTER — Encounter: Payer: Self-pay | Admitting: *Deleted

## 2015-06-29 ENCOUNTER — Ambulatory Visit (INDEPENDENT_AMBULATORY_CARE_PROVIDER_SITE_OTHER): Payer: Managed Care, Other (non HMO) | Admitting: Family Medicine

## 2015-06-29 ENCOUNTER — Encounter: Payer: Self-pay | Admitting: Family Medicine

## 2015-06-29 VITALS — BP 156/78 | HR 79 | Temp 97.9°F | Ht 63.0 in | Wt 161.6 lb

## 2015-06-29 DIAGNOSIS — R030 Elevated blood-pressure reading, without diagnosis of hypertension: Secondary | ICD-10-CM | POA: Diagnosis not present

## 2015-06-29 DIAGNOSIS — M25561 Pain in right knee: Secondary | ICD-10-CM

## 2015-06-29 DIAGNOSIS — G4733 Obstructive sleep apnea (adult) (pediatric): Secondary | ICD-10-CM | POA: Diagnosis not present

## 2015-06-29 DIAGNOSIS — M25569 Pain in unspecified knee: Secondary | ICD-10-CM | POA: Insufficient documentation

## 2015-06-29 NOTE — Progress Notes (Addendum)
   HPI  Patient presents today for knee pain, questions about obstructive sleep apnea, and elevated blood pressure.  Elevated blood pressure She has a history of intermittent elevated blood pressure, over the last 6 months it has been normal then elevated at times. No chest pain, dyspnea, palpitations, leg edema.  Right knee pain After a fall about 6 weeks ago. Anterior knee pain She states that her pain has improved, although not completely. 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 She describes anterior knee pain that's throbbing in nature that it's worse at night while shes trying to sleep. She has no locking or popping symptoms. She does have a crunching in her knee whenever she bends it.  Obstructive sleep apnea She was diagnosed with OSA last year, she cannot afford the Cpap, she wonders how much damage this can be doing, she asked present cons of using a CPap   PMH: Smoking status noted ROS: Per HPI  Objective: BP 156/78 mmHg  Pulse 79  Temp(Src) 97.9 F (36.6 C) (Oral)  Ht  (1.6 m)  Wt 161 lb 9.6 oz (73.301 kg)  BMI 28.63 kg/m2 Gen: NAD, alert, cooperative with exam HEENT: NCAT CV: RRR, good S1/S2, no murmur Resp: CTABL, no wheezes, non-labored Ext: No edema, warm Neuro: Alert and oriented, No gross deficits  MSK: R knee without erythema, effusion, bruising, or gross deformity I'll joint line tenderness Slight crepitus with flexion of the knee.  ligamentously intact to Lachman's and with varus and valgus stress.  Negative McMurray's test   Assessment and plan:  # Patello femoral pain syndrome Knee pain most consistent with this, mild joint line tenderness, possible meniscal injury Discussed rehab exercises., And handout from sports medicine patient advisor. Refer to sports medicine. Compression   # Elevated blood pressure  without diagnosis of hypertension Blood pressure log given  # Obstructive sleep apnea Encouraged her to try and obtain the CPap machine as I think it would help her    Orders Placed This Encounter  Procedures  . Ambulatory referral to Sports Medicine    Referral Priority:  Routine    Referral Type:  Consultation    Number of Visits Requested:  1     Murtis Sink, MD [facility] 06/29/2015, 2:56 PM

## 2015-06-29 NOTE — Addendum Note (Signed)
Addended by: Elenora Gamma on: 06/29/2015 03:02 PM   Modules accepted: Kipp Brood

## 2015-06-29 NOTE — Patient Instructions (Signed)
Great to see you!  Lets follow up in 1 month to look over your blood pressure log  You will be called about a sports medicine appointment  Look over the handout about the knee pain

## 2015-07-08 ENCOUNTER — Encounter: Payer: Self-pay | Admitting: Sports Medicine

## 2015-07-08 ENCOUNTER — Ambulatory Visit (INDEPENDENT_AMBULATORY_CARE_PROVIDER_SITE_OTHER): Payer: Managed Care, Other (non HMO) | Admitting: Sports Medicine

## 2015-07-08 VITALS — BP 137/85 | HR 91 | Ht 63.0 in | Wt 161.0 lb

## 2015-07-08 DIAGNOSIS — M25561 Pain in right knee: Secondary | ICD-10-CM | POA: Diagnosis not present

## 2015-07-08 NOTE — Progress Notes (Deleted)
   Subjective:    Patient ID: Ann Snyder, female    DOB: 1951-02-08, 65 y.o.   MRN: [id number]  HPI Patient reports that Sports Medicine Clinic today for anterior right knee pain.  She notes that she tripped and fell down 2 steps onto her right knee just over a month ago.  She reports that she did not experience pain/ popping immediately following fall but notes pain once getting ready to sleep that evening.  No swelling at time of injury or since.  She was given Voltaren  BID for pain that has not made much of a difference.  She reports that pain has continued each evening since and is worsened by changing positions in bed.  Pain sometimes wakes her from sleep.  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  She has no difficulties with knee pain at work.  She denies any difficulty or pain with walking, squatting.  No locking or popping.  No weakness or sensation changes.  She reports occasional crepitus.    Review of Systems  Musculoskeletal: Positive for arthralgias (in right knee at night time w/ position changes). Negative for joint swelling.  Neurological: Negative for weakness and numbness.      Objective:   Physical Exam  Constitutional: She appears well-developed and well-nourished. No distress.  Musculoskeletal: Normal range of motion. She exhibits tenderness (mild TTP to medial aspect of patella). She exhibits no edema.       Right knee: She exhibits bony tenderness (mild to inferior-medial aspect of patella). She exhibits normal range of motion, no swelling, no effusion, no ecchymosis, no deformity, no erythema, no LCL laxity, normal patellar mobility and no MCL laxity. No tenderness found. No medial joint line, no lateral joint line, no MCL, no LCL and no patellar tendon tenderness noted.  Negative Thessaly, Negative Lachman, LE flexor and extensor strength 5/5 bilaterally, No palpable abnormalities of posterior knee  Neurological: She is alert. She exhibits  normal muscle tone. Coordination normal.  Light touch sensation grossly intact bilateral LE  Skin: No erythema (on either knee).   05/14/2015 AP, Lateral knee xrays obtained at Dr Felipa Emory office personally reviewed.  Mild OA changes noted.  No fractures appreciated.    Assessment & Plan:   1. Right knee pain.  05/24/15 knee xrays reviewed.  No evidence of acute fracture at that time.  Pain likely related to patellar contusion, but cannot rule out patellar fracture given persistent bony anterior knee pain.  No effusion on exam today.  No joint line TTP.  No evidence of ligamentous involvement.  Does not appear to be meniscal pain.  No neurologic deficits.  No weakness. - Will obtain repeat xrays.  Unclear if initial xrays were standing or not.  AP standing, 30 degree flexion (standing), lateral, and sunrise views of RIGHT knee ordered. - DG Knee Complete 4 Views Right; Future - May continue NSAID PRN if helpful - Patient to follow up in 3 weeks for reevaluation and review of xrays.

## 2015-07-09 ENCOUNTER — Ambulatory Visit (HOSPITAL_COMMUNITY)
Admission: RE | Admit: 2015-07-09 | Discharge: 2015-07-09 | Disposition: A | Discharge status: Home / Self Care | Payer: Managed Care, Other (non HMO) | Source: Ambulatory Visit | Attending: Sports Medicine | Admitting: Sports Medicine

## 2015-07-09 DIAGNOSIS — M25561 Pain in right knee: Secondary | ICD-10-CM | POA: Diagnosis present

## 2015-07-09 DIAGNOSIS — M1711 Unilateral primary osteoarthritis, right knee: Secondary | ICD-10-CM | POA: Diagnosis not present

## 2015-07-09 NOTE — Progress Notes (Signed)
   Subjective:    Patient ID: Ann Snyder, female    DOB: [birth year], 65 y.o.   MRN: [id number]  HPI chief complaint: Right knee pain  Very pleasant 65 year old female comes in today complaining of 1 month of anterior right knee pain. Pain began after she fell down some steps and landed directly on her right patella. She had some mild swelling at the time but no immediate pain. Since that time she has noticed no real pain during the day but some generalized achiness throughout the knee at night. Pain will occasionally awaken her from her sleep. Otherwise, she works at [employer] standing on her feet for several hours at a time, and notes that she has no pain or swelling with this. She was seen by her primary care physician, Dr. Murtis Sink, who ordered x-rays of her knees which are available for my review. She was given some Voltaren with instructions to take 75 mg twice daily for pain but she has not noticed much of a difference. She denies any locking or catching. No feelings of instability. No numbness or tingling. She is concerned because there is a strong family history of knee osteoarthritis in her family. She denies any prior knee surgeries. No pain more proximally in the hip.  Past medical history reviewed Medications reviewed Allergies reviewed    Review of Systems    as above Objective:   Physical Exam  Well-developed, well-nourished. No acute distress. Awake alert and oriented 3. Vital signs reviewed  Right knee: Full range of motion. No effusion. No prepatellar swelling. She has slight tenderness to palpation at the inferior patella but not marked. Extensor mechanism is intact. There is no other bony tenderness to palpation. No tenderness along the medial or lateral joint lines. Negative anterior drawer, negative posterior drawer. Knee is stable to valgus and varus stressing. Neurovascularly intact distally. Walking without a significant limp.  X-rays of her right knee  done at her PCPs office are reviewed. They are AP and lateral views of the right knee. No obvious fracture. No obvious effusion. No significant degenerative changes.      Assessment & Plan:  Right knee pain status post fall likely due to patellar contusion  This is likely a simple patellar contusion that will resolve over time. However, given her continuing discomfort, I will get some follow-up x-rays to rule out an occult fracture that may not have been seen at the time of the initial films. I will get an AP standing, 30 flexion standing, lateral, and a sunrise view. I will call her with those results once available. I have reassured her that on the images that I have reviewed from Dr. Felipa Emory office she has very little in the way of osteoarthritis. She may continue with all activity as tolerated.

## 2015-07-10 ENCOUNTER — Telehealth: Payer: Self-pay | Admitting: Sports Medicine

## 2015-07-10 NOTE — Telephone Encounter (Signed)
I spoke with the patient on the phone today regarding x-ray findings of her right knee. She has some mild medial joint space narrowing. She also has a small amount of periosteal reaction over the anterior surface of the patella consistent with her previous contusion. No fracture is seen. I've reassured her of these findings. Her symptoms should improve over the next 3 or 4 weeks. If they do not, patient will return to the office for reevaluation and consideration of further diagnostic imaging versus a diagnostic/therapeutic intra-articular cortisone injection. She may continue with activity as tolerated.

## 2015-08-03 ENCOUNTER — Other Ambulatory Visit: Payer: Self-pay | Admitting: Family Medicine

## 2015-11-17 ENCOUNTER — Ambulatory Visit (INDEPENDENT_AMBULATORY_CARE_PROVIDER_SITE_OTHER): Payer: Managed Care, Other (non HMO) | Admitting: Nurse Practitioner

## 2015-11-17 ENCOUNTER — Encounter: Payer: Self-pay | Admitting: Nurse Practitioner

## 2015-11-17 DIAGNOSIS — L600 Ingrowing nail: Secondary | ICD-10-CM | POA: Diagnosis not present

## 2015-11-17 NOTE — Patient Instructions (Signed)
Ingrown Toenail  An ingrown toenail occurs when the corner or sides of your toenail grow into the surrounding skin. The big toe is most commonly affected, but it can happen to any of your toes. If your ingrown toenail is not treated, you will be at risk for infection.  CAUSES  This condition may be caused by:  · Wearing shoes that are too small or tight.  · Injury or trauma, such as stubbing your toe or having your toe stepped on.  · Improper cutting or care of your toenails.  · Being born with (congenital) nail or foot abnormalities, such as having a nail that is too big for your toe.  RISK FACTORS  Risk factors for an ingrown toenail include:  · Age. Your nails tend to thicken as you get older, so ingrown nails are more common in older people.  · Diabetes.  · Cutting your toenails incorrectly.  · Blood circulation problems.  SYMPTOMS  Symptoms may include:  · Pain, soreness, or tenderness.  · Redness.  · Swelling.  · Hardening of the skin surrounding the toe.  Your ingrown toenail may be infected if there is fluid, pus, or drainage.  DIAGNOSIS   An ingrown toenail may be diagnosed by medical history and physical exam. If your toenail is infected, your health care provider may test a sample of the drainage.  TREATMENT  Treatment depends on the severity of your ingrown toenail. Some ingrown toenails may be treated at home. More severe or infected ingrown toenails may require surgery to remove all or part of the nail. Infected ingrown toenails may also be treated with antibiotic medicines.  HOME CARE INSTRUCTIONS  · If you were prescribed an antibiotic medicine, finish all of it even if you start to feel better.  · Soak your foot in warm soapy water for 20 minutes, 3 times per day or as directed by your health care provider.  · Carefully lift the edge of the nail away from the sore skin by wedging a small piece of cotton under the corner of the nail. This may help with the pain.  Be careful not to cause more injury  to the area.  · Wear shoes that fit well. If your ingrown toenail is causing you pain, try wearing sandals, if possible.  · Trim your toenails regularly and carefully. Do not cut them in a curved shape. Cut your toenails straight across. This prevents injury to the skin at the corners of the toenail.  · Keep your feet clean and dry.  · If you are having trouble walking and are given crutches by your health care provider, use them as directed.  · Do not pick at your toenail or try to remove it yourself.  · Take medicines only as directed by your health care provider.  · Keep all follow-up visits as directed by your health care provider. This is important.  SEEK MEDICAL CARE IF:  · Your symptoms do not improve with treatment.  SEEK IMMEDIATE MEDICAL CARE IF:  · You have red streaks that start at your foot and go up your leg.  · You have a fever.  · You have increased redness, swelling, or pain.  · You have fluid, blood, or pus coming from your toenail.     This information is not intended to replace advice given to you by your health care provider. Make sure you discuss any questions you have with your health care provider.     Document Released:   04/22/2000 Document Revised: 09/09/2014 Document Reviewed: 03/19/2014  Elsevier Interactive Patient Education ©2016 [employer] Inc.

## 2015-11-17 NOTE — Progress Notes (Signed)
   Subjective:    Patient ID: Ann Snyder, female    DOB: 10/19/1950, 65 y.o.   MRN: [id number]  HPI Patient comes in c/o left great toe ingrown toe nail- she has been soaking it in epsom salt.    Review of Systems  Constitutional: Negative.   Respiratory: Negative.   Cardiovascular: Negative.   Neurological: Negative.   Psychiatric/Behavioral: Negative.   All other systems reviewed and are negative.      Objective:   Physical Exam  Constitutional: She is oriented to person, place, and time. She appears well-developed and well-nourished. No distress.  Cardiovascular: Normal rate, regular rhythm and normal heart sounds.   Pulmonary/Chest: Effort normal and breath sounds normal.  Neurological: She is alert and oriented to person, place, and time.  Skin: Skin is warm.  Ingrown toe nail on left lateral big toe  Psychiatric: She has a normal mood and affect. Her behavior is normal. Judgment and thought content normal.    BP 160/94 mmHg  Pulse 76  Temp(Src) 96.8 F (36 C) (Oral)  Ht 5\' 3"  (1.6 m)  Wt 163 lb (73.936 kg)  BMI 28.88 kg/m2  wedging of nail left big toe laterally       Assessment & Plan:  Ingrown nail left great toe lateral side Discussed wedging of toe nail discussed Follow up prn  Mary-Margaret Daphine DeutscherMartin, FNP

## 2015-12-14 ENCOUNTER — Encounter: Payer: Self-pay | Admitting: Family Medicine

## 2015-12-14 ENCOUNTER — Ambulatory Visit (INDEPENDENT_AMBULATORY_CARE_PROVIDER_SITE_OTHER): Payer: Managed Care, Other (non HMO) | Admitting: Family Medicine

## 2015-12-14 VITALS — BP 143/81 | HR 81 | Temp 97.2°F | Ht 63.0 in | Wt 161.6 lb

## 2015-12-14 DIAGNOSIS — E781 Pure hyperglyceridemia: Secondary | ICD-10-CM

## 2015-12-14 DIAGNOSIS — B351 Tinea unguium: Secondary | ICD-10-CM

## 2015-12-14 DIAGNOSIS — S80922A Unspecified superficial injury of left lower leg, initial encounter: Secondary | ICD-10-CM

## 2015-12-14 DIAGNOSIS — E785 Hyperlipidemia, unspecified: Secondary | ICD-10-CM

## 2015-12-14 DIAGNOSIS — R030 Elevated blood-pressure reading, without diagnosis of hypertension: Secondary | ICD-10-CM | POA: Diagnosis not present

## 2015-12-14 DIAGNOSIS — R229 Localized swelling, mass and lump, unspecified: Secondary | ICD-10-CM | POA: Insufficient documentation

## 2015-12-14 DIAGNOSIS — W57XXXA Bitten or stung by nonvenomous insect and other nonvenomous arthropods, initial encounter: Secondary | ICD-10-CM | POA: Diagnosis not present

## 2015-12-14 DIAGNOSIS — S80921A Unspecified superficial injury of right lower leg, initial encounter: Secondary | ICD-10-CM | POA: Diagnosis not present

## 2015-12-14 MED ORDER — ATORVASTATIN CALCIUM 20 MG PO TABS: ORAL_TABLET | ORAL | 3 refills | Status: DC

## 2015-12-14 NOTE — Patient Instructions (Signed)
Great to see you!  Continue using the toenail laquer, I believe it is working.   Lets see you again in 4-6 months

## 2015-12-14 NOTE — Progress Notes (Addendum)
   HPI  Patient presents today here for discussion of several chronic medical conditions and bug bites.  Bug bites Have been coming and going over the last 2 weeks on her bilateral lower extremities low the midcalf and without any apparent bugs that she has seen.  They come up, they're slightly nodular and itchy and then resolved. She's concerned about bedbugs, she does not have bites anywhere besides her legs. She has 2 cats.  Left chest nodule Has not changed, has been present for 5+ years Previously told she had a cyst, never infected.  Metabolic syndrome, hyperlipidemia, hypertriglyceridemia Good medication compliance Nonfasting today Exercising by walking 30 minutes twice a week. Active at work Watching her diet moderately, has dietary indiscretions at work.  Elevated blood pressure No history of hypertension Can check her blood pressure at work, she will return with a blood pressure long  Akin toenails Has been cultured by podiatry, negative for fungus Podiatrist recommended over-the-counter nail lacquer, no clear improvement since that time  PMH: Smoking status noted ROS: Per HPI  Objective: BP (!) 143/81   Pulse 81   Temp 97.2 F (36.2 C) (Oral)   Ht '5\' 3"'$  (1.6 m)   Wt 161 lb 9.6 oz (73.3 kg)   BMI 28.63 kg/m  Gen: NAD, alert, cooperative with exam HEENT: NCAT CV: RRR, good S1/S2, no murmur Resp: CTABL, no wheezes, non-labored Ext: No edema, warm Neuro: Alert and oriented, No gross deficits  Skin: R great toe with thickened yellow toenail, other 4 toes also with thickened toenails that are yellowish in discoloration. Right great toe with area of normal growing toenail at the base  Left chest: 1.7 cm x 0.7 cm skin nodule with no tenderness to palpation, flesh-colored, no fluctuance or drainage  Skin Erythematous papules on the right medial leg, left anterior leg, and right dorsal foot. No areas of fluctuance, drainage, or warmth concerning for  infection   Assessment and plan:  # Hyperlipidemia, hypertriglyceridemia Repeating labs today, nonfasting Continue Vascepa and lipitor Discussed diet and exercise changes as well   # Elevated blood pressure Slightly elevated, blood pressure long, return to clinic in 6-8 weeks  # Arthropod bites Reassurance provided, lower leg papules consistent with bug bites, unlikely to be bedbugs given distribution of only legs. Continue hydrocortisone cream which is getting pretty good results. Consider fleas as well  # Thickened toenails, clinically onychomycosis Has been ruled out to be fungal from recent culture by her podiatrist, however he recommended over-the-counter antifungal lacquer which she appears to be responding well to. Encouraged her to continue  Skin nodule Benign appearing, reassurance provided, watchful waiting    Orders Placed This Encounter  Procedures  . CBC with Differential/Platelet  . CMP14+EGFR  . Lipid panel  . TSH    No orders of the defined types were placed in this encounter.   Laroy Apple, MD [facility] 12/14/2015, 12:53 PM

## 2015-12-15 ENCOUNTER — Other Ambulatory Visit: Payer: Self-pay | Admitting: *Deleted

## 2015-12-15 DIAGNOSIS — E781 Pure hyperglyceridemia: Secondary | ICD-10-CM

## 2015-12-15 LAB — CMP14+EGFR
A/G RATIO: 1.6 (ref 1.2–2.2)
ALBUMIN: 4.4 g/dL (ref 3.6–4.8)
ALT: 22 IU/L (ref 0–32)
AST: 17 IU/L (ref 0–40)
Alkaline Phosphatase: 102 IU/L (ref 39–117)
BUN / CREAT RATIO: 19 (ref 12–28)
BUN: 13 mg/dL (ref 8–27)
CHLORIDE: 104 mmol/L (ref 96–106)
CO2: 24 mmol/L (ref 18–29)
Calcium: 9.1 mg/dL (ref 8.7–10.3)
Creatinine, Ser: 0.67 mg/dL (ref 0.57–1.00)
GFR calc non Af Amer: 94 mL/min/{1.73_m2} (ref >59)
GFR, EST AFRICAN AMERICAN: 108 mL/min/{1.73_m2} (ref >59)
Globulin, Total: 2.7 g/dL (ref 1.5–4.5)
Glucose: 96 mg/dL (ref 65–99)
POTASSIUM: 4 mmol/L (ref 3.5–5.2)
Sodium: 141 mmol/L (ref 134–144)
TOTAL PROTEIN: 7.1 g/dL (ref 6.0–8.5)

## 2015-12-15 LAB — CBC WITH DIFFERENTIAL/PLATELET
BASOS: 1 %
Basophils Absolute: 0 10*3/uL (ref 0.0–0.2)
EOS (ABSOLUTE): 0.3 10*3/uL (ref 0.0–0.4)
Eos: 4 %
HEMOGLOBIN: 12.1 g/dL (ref 11.1–15.9)
Hematocrit: 37.2 % (ref 34.0–46.6)
IMMATURE GRANS (ABS): 0 10*3/uL (ref 0.0–0.1)
Immature Granulocytes: 0 %
LYMPHS: 32 %
Lymphocytes Absolute: 2.2 10*3/uL (ref 0.7–3.1)
MCH: 26.1 pg — AB (ref 26.6–33.0)
MCHC: 32.5 g/dL (ref 31.5–35.7)
MCV: 80 fL (ref 79–97)
MONOCYTES: 9 %
Monocytes Absolute: 0.6 10*3/uL (ref 0.1–0.9)
NEUTROS ABS: 3.6 10*3/uL (ref 1.4–7.0)
Neutrophils: 54 %
Platelets: 235 10*3/uL (ref 150–379)
RBC: 4.63 x10E6/uL (ref 3.77–5.28)
RDW: 14.4 % (ref 12.3–15.4)
WBC: 6.7 10*3/uL (ref 3.4–10.8)

## 2015-12-15 LAB — LIPID PANEL
Chol/HDL Ratio: 5.5 ratio units — ABNORMAL HIGH (ref 0.0–4.4)
Cholesterol, Total: 165 mg/dL (ref 100–199)
HDL: 30 mg/dL — ABNORMAL LOW (ref >39)
Triglycerides: 401 mg/dL — ABNORMAL HIGH (ref 0–149)

## 2015-12-15 LAB — TSH: TSH: 1.64 u[IU]/mL (ref 0.450–4.500)

## 2016-02-18 ENCOUNTER — Other Ambulatory Visit (INDEPENDENT_AMBULATORY_CARE_PROVIDER_SITE_OTHER): Payer: Managed Care, Other (non HMO)

## 2016-02-18 DIAGNOSIS — E781 Pure hyperglyceridemia: Secondary | ICD-10-CM

## 2016-02-19 ENCOUNTER — Other Ambulatory Visit: Payer: Self-pay

## 2016-02-19 ENCOUNTER — Telehealth: Payer: Self-pay | Admitting: Family Medicine

## 2016-02-19 LAB — LIPID PANEL
CHOL/HDL RATIO: 3.9 ratio (ref 0.0–4.4)
CHOLESTEROL TOTAL: 149 mg/dL (ref 100–199)
HDL: 38 mg/dL — ABNORMAL LOW (ref >39)
LDL CALC: 63 mg/dL (ref 0–99)
Triglycerides: 238 mg/dL — ABNORMAL HIGH (ref 0–149)
VLDL CHOLESTEROL CAL: 48 mg/dL — AB (ref 5–40)

## 2016-02-19 MED ORDER — ICOSAPENT ETHYL 1 G PO CAPS: 2.0000 g | ORAL_CAPSULE | Freq: Two times a day (BID) | ORAL | 5 refills | Status: DC

## 2016-03-15 ENCOUNTER — Encounter: Payer: Self-pay | Admitting: Family Medicine

## 2016-03-15 ENCOUNTER — Ambulatory Visit (INDEPENDENT_AMBULATORY_CARE_PROVIDER_SITE_OTHER): Payer: Managed Care, Other (non HMO) | Admitting: Family Medicine

## 2016-03-15 VITALS — BP 139/80 | HR 86 | Temp 97.0°F | Ht 63.0 in | Wt 164.2 lb

## 2016-03-15 DIAGNOSIS — R0789 Other chest pain: Secondary | ICD-10-CM | POA: Diagnosis not present

## 2016-03-15 DIAGNOSIS — B359 Dermatophytosis, unspecified: Secondary | ICD-10-CM | POA: Diagnosis not present

## 2016-03-15 DIAGNOSIS — L989 Disorder of the skin and subcutaneous tissue, unspecified: Secondary | ICD-10-CM | POA: Diagnosis not present

## 2016-03-15 DIAGNOSIS — L723 Sebaceous cyst: Secondary | ICD-10-CM | POA: Diagnosis not present

## 2016-03-15 MED ORDER — KETOCONAZOLE 2 % EX CREA: 1.0000 "application " | TOPICAL_CREAM | Freq: Every day | CUTANEOUS | 0 refills | Status: DC

## 2016-03-15 NOTE — Patient Instructions (Signed)
Great to see you!  You will be called to arrange a referral to a dermatologist.   Try terconazole cream twice daily on the red rough spot on your left thumb.

## 2016-03-15 NOTE — Progress Notes (Signed)
HPI  Patient presents today with skin complaints.   Left thumb rash Present for about one week, not irritated itchy, seems to be getting slightly larger.  Right upper back Present for about one month, she states that it got red and large and slightly tender for a short time, interrupted, and has begun healing. . Left dorsal hand Brown spot present for several years, patient's very concerned and would like to see a dermatologist. She is concerned about skin cancer History of skin cancers in problems.  Oh by the way, at the very end of the visit, patient states that she has been having left-sided sharp chest pains that were evaluated 2 years ago. She states they have not changed, she was evaluated with a stress test and EKG. She describes short intermittent left-sided sharp chest pains that last only a moment and then go away. They come on at rest and also with activity. They are nonexertional, they are not predictable. They're not associated with palpitations, shortness of breath, sweating, or radiating to any areas.   PMH: Smoking status noted ROS: Per HPI  Objective: BP 139/80   Pulse 86   Temp 97 F (36.1 C) (Oral)   Ht 5\' 3"  (1.6 m)   Wt 164 lb 3.2 oz (74.5 kg)   BMI 29.09 kg/m  Gen: NAD, alert, cooperative with exam HEENT: NCAT CV: RRR, good S1/S2, no murmur Resp: CTABL, no wheezes, non-labored Ext: No edema, warm Neuro: Alert and oriented, No gross deficits  Skin Left thumb with erythematous slightly raised rough feeling rash that's roughly circular 1 cm x 9 mm in diameter, slightly erythematous ring  Left dorsal hand with brown flat spot measuring 5 mm x 3 mm, light brown distally and dark brown spot at the top of the lesion.  Right mid back Erythematous circular lesion approximately 1 cm in diameter with small necrotic center measuring 2-3 mm in diameter. No fluctuance, tenderness to palpation, or warmth. 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 Consider biopsy however the lesion is located just over a vessel on the hand Dermatology referral  # Chest pain No change in chest pain over the last 2 years Atypical in nature Exercise stress test in May 2016, about 18 months ago, was normal. Reassurance provided, welcome her to come back if it changes at all or she has additional symptoms, or if she is more worried about it. She does not want additional testing today.    Orders Placed This Encounter  Procedures  . Ambulatory referral to Dermatology    Referral Priority:   Routine    Referral Type:   Consultation    Referral Reason:   Specialty Services Required    Requested Specialty:   Dermatology    Number of Visits Requested:   1    Meds ordered this encounter  Medications  . ketoconazole (NIZORAL) 2 % cream    Sig: Apply 1 application topically daily.    Dispense:  15 g    Refill:  0    Murtis SinkSam Jamesyn Moorefield, MD [facility] 03/15/2016, 2:26 PM

## 2016-05-18 ENCOUNTER — Encounter: Payer: Self-pay | Admitting: Pediatrics

## 2016-05-18 ENCOUNTER — Ambulatory Visit (INDEPENDENT_AMBULATORY_CARE_PROVIDER_SITE_OTHER): Payer: BLUE CROSS/BLUE SHIELD | Admitting: Pediatrics

## 2016-05-18 VITALS — BP 163/91 | HR 131 | Temp 102.8°F | Resp 20 | Ht 63.0 in | Wt 162.6 lb

## 2016-05-18 DIAGNOSIS — R6889 Other general symptoms and signs: Secondary | ICD-10-CM

## 2016-05-18 DIAGNOSIS — J101 Influenza due to other identified influenza virus with other respiratory manifestations: Secondary | ICD-10-CM | POA: Diagnosis not present

## 2016-05-18 LAB — VERITOR FLU A/B WAIVED
INFLUENZA A: POSITIVE — AB
INFLUENZA B: NEGATIVE

## 2016-05-18 MED ORDER — OSELTAMIVIR PHOSPHATE 75 MG PO CAPS: 75.0000 mg | ORAL_CAPSULE | Freq: Two times a day (BID) | ORAL | 0 refills | Status: DC

## 2016-05-18 NOTE — Progress Notes (Signed)
  Subjective:   Patient ID: Ann Snyder, female    DOB: 06/24/1950, 66 y.o.   MRN: [id number] CC: Cough and Emesis  HPI: Ann Snyder is a 66 y.o. female presenting for Cough and Emesis  Started coughing 2 days ago, scratchy throat Vomited once today, not much Not feeling sick to her stomach now Did have flu shot Had flu years ago Works at [employer]   Relevant past medical, surgical, family and social history reviewed. Allergies and medications reviewed and updated. History  Smoking Status  . Never Smoker  Smokeless Tobacco  . Never Used   ROS: Per HPI   Objective:    BP (!) 163/91   Pulse (!) 131   Temp (!) 102.8 F (39.3 C) (Oral)   Resp 20   Ht 5\' 3"  (1.6 m)   Wt 162 lb 9.6 oz (73.8 kg)   SpO2 98%   BMI 28.80 kg/m   Wt Readings from Last 3 Encounters:  05/18/16 162 lb 9.6 oz (73.8 kg)  03/15/16 164 lb 3.2 oz (74.5 kg)  12/14/15 161 lb 9.6 oz (73.3 kg)    Gen: NAD, alert, cooperative with exam, NCAT EYES: EOMI, no conjunctival injection, or no icterus ENT:  TMs pearly gray b/l, OP with mild erythema LYMPH: no cervical LAD CV: NRRR, normal S1/S2, no murmur, distal pulses 2+ b/l Resp: CTABL, no wheezes, normal WOB Abd: +BS, soft, NTND. no guarding or organomegaly Ext: No edema, warm Neuro: Alert and oriented  Assessment & Plan:  Ann Snyder was seen today for cough and emesis.  Diagnoses and all orders for this visit:  Influenza A Positive test Treat as below Discussed symptomatic care, return precautions Take tylenol scheduled -     oseltamivir (TAMIFLU) 75 MG capsule; Take 1 capsule (75 mg total) by mouth 2 (two) times daily.  Flu-like symptoms -     Veritor Flu A/B Waived  Follow up plan: prn Ann Krasarol Vincent, MD [facility]

## 2016-07-13 DIAGNOSIS — D485 Neoplasm of uncertain behavior of skin: Secondary | ICD-10-CM | POA: Diagnosis not present

## 2016-07-13 DIAGNOSIS — D171 Benign lipomatous neoplasm of skin and subcutaneous tissue of trunk: Secondary | ICD-10-CM | POA: Diagnosis not present

## 2016-07-13 DIAGNOSIS — L814 Other melanin hyperpigmentation: Secondary | ICD-10-CM | POA: Diagnosis not present

## 2016-07-13 DIAGNOSIS — L821 Other seborrheic keratosis: Secondary | ICD-10-CM | POA: Diagnosis not present

## 2016-07-13 DIAGNOSIS — D2239 Melanocytic nevi of other parts of face: Secondary | ICD-10-CM | POA: Diagnosis not present

## 2016-07-13 DIAGNOSIS — D1801 Hemangioma of skin and subcutaneous tissue: Secondary | ICD-10-CM | POA: Diagnosis not present

## 2016-07-14 ENCOUNTER — Ambulatory Visit: Payer: BLUE CROSS/BLUE SHIELD | Admitting: Family Medicine

## 2016-07-18 ENCOUNTER — Encounter: Payer: Self-pay | Admitting: Family Medicine

## 2016-07-19 ENCOUNTER — Ambulatory Visit (INDEPENDENT_AMBULATORY_CARE_PROVIDER_SITE_OTHER): Payer: BLUE CROSS/BLUE SHIELD

## 2016-07-19 ENCOUNTER — Encounter: Payer: Self-pay | Admitting: Family Medicine

## 2016-07-19 ENCOUNTER — Ambulatory Visit (INDEPENDENT_AMBULATORY_CARE_PROVIDER_SITE_OTHER): Payer: BLUE CROSS/BLUE SHIELD | Admitting: Family Medicine

## 2016-07-19 VITALS — BP 147/85 | HR 99 | Temp 97.9°F | Ht 63.0 in | Wt 160.0 lb

## 2016-07-19 DIAGNOSIS — M545 Low back pain, unspecified: Secondary | ICD-10-CM

## 2016-07-19 DIAGNOSIS — R05 Cough: Secondary | ICD-10-CM | POA: Diagnosis not present

## 2016-07-19 DIAGNOSIS — R059 Cough, unspecified: Secondary | ICD-10-CM

## 2016-07-19 MED ORDER — HYDROCODONE-HOMATROPINE 5-1.5 MG/5ML PO SYRP: 5.0000 mL | ORAL_SOLUTION | Freq: Four times a day (QID) | ORAL | 0 refills | Status: DC | PRN

## 2016-07-19 MED ORDER — BENZONATATE 100 MG PO CAPS: 100.0000 mg | ORAL_CAPSULE | Freq: Two times a day (BID) | ORAL | 0 refills | Status: DC | PRN

## 2016-07-19 MED ORDER — METHYLPREDNISOLONE ACETATE 80 MG/ML IJ SUSP
80.0000 mg | Freq: Once | INTRAMUSCULAR | Status: AC
  Administered 2016-07-19: 80 mg via INTRAMUSCULAR

## 2016-07-19 MED ORDER — AZITHROMYCIN 250 MG PO TABS: ORAL_TABLET | ORAL | 0 refills | Status: DC

## 2016-07-19 NOTE — Progress Notes (Signed)
   HPI  Patient presents today here with cough and pain.  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 She's had 3-4 days of tickling type cough with raspiness. She denies any shortness of breath, fevers, chills, or sweats. She is nervous that this cough is something serious like influenza.  Patient also complains of 6 months or so of intermittent right-sided low back pain. This happens mostly at work, however when she has a flare it also bothers her at home and she simply walking or during everyday tasks. She describes sharp shooting right low back pain radiating around to the right ASIS  Bowel or bladder dysfunction, saddle anesthesia, or leg weakness. No injury Bothering her for about 2 weeks now.  PMH: Smoking status noted ROS: Per HPI  Objective: BP (!) 147/85   Pulse 99   Temp 97.9 F (36.6 C) (Oral)   Ht 5\' 3"  (1.6 m)   Wt 160 lb (72.6 kg)   BMI 28.34 kg/m  Gen: NAD, alert, cooperative with exam HEENT: NCAT, no tenderness to palpation of bilateral maxillary or frontal sinuses, oropharynx moist and clear, nares clear, TMs normal bilaterally CV: RRR, good S1/S2, no murmur Resp: CTABL, no wheezes, non-labored Ext: No edema, warm Neuro: Alert and oriented, No gross deficits  Assessment and plan:  # Cough Likely viral, however given severity and also recent illnesses 5 gone ahead and covered her with azithromycin I am Depo-Medrol will help. Tessalon for daytime cough, Hycodan for nighttime cough  # Acute right-sided low back pain without sciatica New problem Patient with intermittent back pain in the last several months, now for about 2 weeks. This is moderate in nature, I am Depo-Medrol given which will likely help. Also discussed the possibility of physical therapy Plain film ordered as well.    Orders Placed This Encounter  Procedures  .  DG Lumbar Spine 2-3 Views    Standing Status:   Future    Number of Occurrences:   1    Standing Expiration Date:   09/18/2017    Order Specific Question:   Reason for Exam (SYMPTOM  OR DIAGNOSIS REQUIRED)    Answer:   low back pain, acute on chronic R sided without Sciatica    Order Specific Question:   Preferred imaging location?    Answer:   Internal    Meds ordered this encounter  Medications  . benzonatate (TESSALON) 100 MG capsule    Sig: Take 1 capsule (100 mg total) by mouth 2 (two) times daily as needed for cough.    Dispense:  20 capsule    Refill:  0  . azithromycin (ZITHROMAX) 250 MG tablet    Sig: Take 2 tablets on day 1 and 1 tablet daily after that    Dispense:  6 tablet    Refill:  0  . HYDROcodone-homatropine (HYCODAN) 5-1.5 MG/5ML syrup    Sig: Take 5 mLs by mouth every 6 (six) hours as needed for cough.    Dispense:  120 mL    Refill:  0  . methylPREDNISolone acetate (DEPO-MEDROL) injection 80 mg    Murtis SinkSam Darleth Eustache, MD [facility] 07/19/2016, 11:52 AM

## 2016-07-19 NOTE — Patient Instructions (Signed)
Great to see you!  The injection you recicved is depo-medrol, a steroid that is a potent anti-inflammatory, it will help the cough and the pain.   Azithromycin is an antibiotic Tessalon Perles are for daytime cough Hycodan is for night-time cough, do not drive after you take this.   Please call or come back with any cocnerns

## 2016-07-21 ENCOUNTER — Telehealth: Payer: Self-pay | Admitting: Family Medicine

## 2016-07-21 NOTE — Telephone Encounter (Signed)
Pt notified of recommendation Verbalizes understanding Work note to front for pt pick up

## 2016-07-21 NOTE — Telephone Encounter (Signed)
If she has not improved she may need to follow up for chest x ray and to change medications.   She should only be on day 3 of azithromycin so there is reason to believe that if she has not worsened that waiting is appropriate.   Ok with work note for 3 more days.   Murtis SinkSam Bradshaw, MD [facility] 07/21/2016, 12:56 PM

## 2016-07-21 NOTE — Telephone Encounter (Signed)
Pt is having continued cough and congestion She is scheduled to work tomorrow  She feels she is unable to wrk due cough Please review and advise

## 2016-09-05 ENCOUNTER — Encounter: Payer: Managed Care, Other (non HMO) | Admitting: *Deleted

## 2016-09-05 DIAGNOSIS — Z1231 Encounter for screening mammogram for malignant neoplasm of breast: Secondary | ICD-10-CM | POA: Diagnosis not present

## 2016-11-10 ENCOUNTER — Encounter: Payer: Self-pay | Admitting: Pediatrics

## 2016-11-10 ENCOUNTER — Ambulatory Visit (INDEPENDENT_AMBULATORY_CARE_PROVIDER_SITE_OTHER): Payer: BLUE CROSS/BLUE SHIELD | Admitting: Pediatrics

## 2016-11-10 VITALS — BP 152/88 | HR 80 | Temp 97.0°F | Ht 63.0 in | Wt 160.4 lb

## 2016-11-10 DIAGNOSIS — Z6828 Body mass index (BMI) 28.0-28.9, adult: Secondary | ICD-10-CM | POA: Diagnosis not present

## 2016-11-10 DIAGNOSIS — R0781 Pleurodynia: Secondary | ICD-10-CM | POA: Diagnosis not present

## 2016-11-10 DIAGNOSIS — I10 Essential (primary) hypertension: Secondary | ICD-10-CM | POA: Diagnosis not present

## 2016-11-10 MED ORDER — LISINOPRIL 10 MG PO TABS: 10.0000 mg | ORAL_TABLET | Freq: Every day | ORAL | 3 refills | Status: DC

## 2016-11-10 NOTE — Patient Instructions (Signed)
Goal blood pressures 120s/70s 

## 2016-11-10 NOTE — Progress Notes (Signed)
  Subjective:   Patient ID: Ann Snyder, female    DOB: 10/20/1950, 66 y.o.   MRN: [id number] CC: pain in sides  HPI: Ann RakersDeborah J Snyder is a 66 y.o. female presenting for pain in sides HTN: for past year Checks at work at times About what it is today, 140s-150s systolic No CP, no SOB  Has ongoing sharp twinges when she lifts things in her R hip, sometimes in lower ribs Lifts heavy crates regularly at work Pain never lasts for longer and a second, pt calls them "twinges"  Says she is interested in walking more, wants to lose weight Her son is mostly a vegetarin and has lost quite a bit of weight with walking regularly too He lives with her now She says he would go walking with her  No rash on her skin  Relevant past medical, surgical, family and social history reviewed. Allergies and medications reviewed and updated. History  Smoking Status  . Never Smoker  Smokeless Tobacco  . Never Used   ROS: Per HPI   Objective:    BP (!) 152/88   Pulse 80   Temp (!) 97 F (36.1 C) (Oral)   Ht 5\' 3"  (1.6 m)   Wt 160 lb 6.4 oz (72.8 kg)   BMI 28.41 kg/m   Wt Readings from Last 3 Encounters:  11/10/16 160 lb 6.4 oz (72.8 kg)  07/19/16 160 lb (72.6 kg)  05/18/16 162 lb 9.6 oz (73.8 kg)    Gen: NAD, alert, cooperative with exam, NCAT EYES: EOMI, no conjunctival injection, or no icterus LYMPH: no cervical LAD CV: NRRR, normal S1/S2, no murmur, distal pulses 2+ b/l Resp: CTABL, no wheezes, normal WOB Abd: +BS, soft, NTND. no guarding or organomegaly Ext: No edema, warm Neuro: Alert and oriented, strength equal b/l UE and LE, coordination grossly normal MSK: no point tenderness along sternum, ribs No point tenderness over spine, no pain with flexion/twisting spine Skin: no rash  Assessment & Plan:  Ann Snyder was seen today for breast pain.  Diagnoses and all orders for this visit:  Essential hypertension Has regularly been elevated at home, asymptomatic Will start  lisinopril Cont to check, f/u 4 weeks, return precautions given -     Basic Metabolic Panel -     lisinopril (PRINIVIL,ZESTRIL) 10 MG tablet; Take 1 tablet (10 mg total) by mouth daily.  Rib pain Not reproducible now Non-pleuritic No pain now Cont regular exercise, walking, return precautions discussed  BMI 28.0-28.9,adult Walk 30 min daily  Follow up plan: Return in about 4 weeks (around 12/08/2016) for hypertension follow up. Rex Krasarol Nena Hampe, MD [facility]

## 2016-11-11 LAB — BASIC METABOLIC PANEL
BUN / CREAT RATIO: 17 (ref 12–28)
BUN: 11 mg/dL (ref 8–27)
CO2: 23 mmol/L (ref 20–29)
Calcium: 9.2 mg/dL (ref 8.7–10.3)
Chloride: 104 mmol/L (ref 96–106)
Creatinine, Ser: 0.65 mg/dL (ref 0.57–1.00)
GFR calc Af Amer: 109 mL/min/{1.73_m2} (ref >59)
GFR calc non Af Amer: 94 mL/min/{1.73_m2} (ref >59)
GLUCOSE: 80 mg/dL (ref 65–99)
POTASSIUM: 4.3 mmol/L (ref 3.5–5.2)
SODIUM: 142 mmol/L (ref 134–144)

## 2016-12-20 ENCOUNTER — Ambulatory Visit (INDEPENDENT_AMBULATORY_CARE_PROVIDER_SITE_OTHER): Payer: BLUE CROSS/BLUE SHIELD | Admitting: Family Medicine

## 2016-12-20 ENCOUNTER — Encounter: Payer: Self-pay | Admitting: Family Medicine

## 2016-12-20 VITALS — BP 123/75 | HR 79 | Temp 97.3°F | Ht 63.0 in | Wt 159.0 lb

## 2016-12-20 DIAGNOSIS — F419 Anxiety disorder, unspecified: Secondary | ICD-10-CM

## 2016-12-20 DIAGNOSIS — E1159 Type 2 diabetes mellitus with other circulatory complications: Secondary | ICD-10-CM | POA: Insufficient documentation

## 2016-12-20 DIAGNOSIS — I10 Essential (primary) hypertension: Secondary | ICD-10-CM

## 2016-12-20 DIAGNOSIS — R232 Flushing: Secondary | ICD-10-CM

## 2016-12-20 DIAGNOSIS — E785 Hyperlipidemia, unspecified: Secondary | ICD-10-CM

## 2016-12-20 HISTORY — DX: Essential (primary) hypertension: I10

## 2016-12-20 MED ORDER — ESCITALOPRAM OXALATE 10 MG PO TABS: 10.0000 mg | ORAL_TABLET | Freq: Every day | ORAL | 5 refills | Status: DC

## 2016-12-20 MED ORDER — ATORVASTATIN CALCIUM 20 MG PO TABS: ORAL_TABLET | ORAL | 3 refills | Status: DC

## 2016-12-20 NOTE — Progress Notes (Signed)
   HPI  Patient presents today here for follow-up and multiple complaints.  Hypertension New diagnosis last visit, started lisinopril, tolerating well.  Patient has 6 months or more of fatigue, she also has anxiety about finances and work. She also has continued chest pain that lasts a few moments, described as central and left chest chest pain with no aggravating or alleviating factors. This seems to be associated with stress as well.  Patient does have a history of severe depression for a period of time and has not had a recurrence.  Flushing Patient complains of bilateral facial flushing that happens while she is at work. 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 Patient states that she gets so hot at work that she sweats through her clothes and has been changes since she gets home.  Refill of Lipitor, she has good medication compliance  PMH: Smoking status noted ROS: Per HPI  Objective: BP 123/75   Pulse 79   Temp (!) 97.3 F (36.3 C) (Oral)   Ht _0  (1.6 m)   Wt 159 lb (72.1 kg)   BMI 28.17 kg/m  Gen: NAD, alert, cooperative with exam HEENT: NCAT CV: RRR, good S1/S2, no murmur Resp: CTABL, no wheezes, non-labored Ext: No edema, warm Neuro: Alert and oriented, No gross deficits  Depression screen [facility] 2/9 12/20/2016 11/10/2016 07/19/2016 05/18/2016 03/15/2016  Decreased Interest 0 0 0 0 0  Down, Depressed, Hopeless 0 0 0 0 0  PHQ - 2 Score 0 0 0 0 0   Denies SI.   Assessment and plan:  # Anxiety Patient with mild left-sided chest pains, already evaluated by cardiology, difficulty sleeping, persistent nagging thoughts Start Lexapro, follow-up 3-4 weeks  # Hypertension Well-controlled now on 10 mg lisinopril Repeat BMP*  # Flushing Likely thermoregulatory flushing, reassurance provided No signs of rosacea  # Hyperlipidemia Repeat labs, refill Lipitor Clinically stable    Orders Placed This Encounter  Procedures  . Lipid panel    . CBC with Differential/Platelet  . TSH  . CMP14+EGFR    Meds ordered this encounter  Medications  . atorvastatin (LIPITOR) 20 MG tablet    Sig: TAKE 1 TABLET (20 MG TOTAL) BY MOUTH DAILY.    Dispense:  90 tablet    Refill:  3  . escitalopram (LEXAPRO) 10 MG tablet    Sig: Take 1 tablet (10 mg total) by mouth daily.    Dispense:  30 tablet    Refill:  [place], MD [place] 12/20/2016, 12:11 PM

## 2016-12-20 NOTE — Patient Instructions (Signed)
Great to see you!  Start lexapro 1/2 pill for 8 days then take 1 pill once daily, come back in 4 weeks for follow up

## 2016-12-21 LAB — CMP14+EGFR
A/G RATIO: 1.7 (ref 1.2–2.2)
ALBUMIN: 4.3 g/dL (ref 3.6–4.8)
ALK PHOS: 94 IU/L (ref 39–117)
ALT: 23 IU/L (ref 0–32)
AST: 19 IU/L (ref 0–40)
BUN / CREAT RATIO: 22 (ref 12–28)
BUN: 16 mg/dL (ref 8–27)
Bilirubin Total: 0.3 mg/dL (ref 0.0–1.2)
CALCIUM: 9.3 mg/dL (ref 8.7–10.3)
CO2: 23 mmol/L (ref 20–29)
CREATININE: 0.74 mg/dL (ref 0.57–1.00)
Chloride: 105 mmol/L (ref 96–106)
GFR calc Af Amer: 99 mL/min/{1.73_m2} (ref >59)
GFR, EST NON AFRICAN AMERICAN: 86 mL/min/{1.73_m2} (ref >59)
GLOBULIN, TOTAL: 2.6 g/dL (ref 1.5–4.5)
Glucose: 93 mg/dL (ref 65–99)
Potassium: 4.5 mmol/L (ref 3.5–5.2)
SODIUM: 143 mmol/L (ref 134–144)
Total Protein: 6.9 g/dL (ref 6.0–8.5)

## 2016-12-21 LAB — LIPID PANEL
CHOL/HDL RATIO: 4.6 ratio — AB (ref 0.0–4.4)
Cholesterol, Total: 158 mg/dL (ref 100–199)
HDL: 34 mg/dL — ABNORMAL LOW (ref >39)
LDL CALC: 63 mg/dL (ref 0–99)
TRIGLYCERIDES: 303 mg/dL — AB (ref 0–149)
VLDL CHOLESTEROL CAL: 61 mg/dL — AB (ref 5–40)

## 2016-12-21 LAB — CBC WITH DIFFERENTIAL/PLATELET
BASOS: 1 %
Basophils Absolute: 0 10*3/uL (ref 0.0–0.2)
EOS (ABSOLUTE): 0.2 10*3/uL (ref 0.0–0.4)
Eos: 3 %
Hematocrit: 38.4 % (ref 34.0–46.6)
Hemoglobin: 12.7 g/dL (ref 11.1–15.9)
Immature Grans (Abs): 0 10*3/uL (ref 0.0–0.1)
Immature Granulocytes: 0 %
Lymphocytes Absolute: 2.1 10*3/uL (ref 0.7–3.1)
Lymphs: 37 %
MCH: 28.1 pg (ref 26.6–33.0)
MCHC: 33.1 g/dL (ref 31.5–35.7)
MCV: 85 fL (ref 79–97)
MONOS ABS: 0.4 10*3/uL (ref 0.1–0.9)
Monocytes: 8 %
NEUTROS ABS: 2.8 10*3/uL (ref 1.4–7.0)
Neutrophils: 51 %
PLATELETS: 218 10*3/uL (ref 150–379)
RBC: 4.52 x10E6/uL (ref 3.77–5.28)
RDW: 14.1 % (ref 12.3–15.4)
WBC: 5.5 10*3/uL (ref 3.4–10.8)

## 2016-12-21 LAB — TSH: TSH: 2.27 u[IU]/mL (ref 0.450–4.500)

## 2016-12-22 ENCOUNTER — Encounter: Payer: Self-pay | Admitting: Family Medicine

## 2017-02-09 ENCOUNTER — Other Ambulatory Visit: Payer: Self-pay | Admitting: Family Medicine

## 2017-03-03 ENCOUNTER — Telehealth: Payer: Self-pay | Admitting: Family Medicine

## 2017-03-08 ENCOUNTER — Ambulatory Visit (INDEPENDENT_AMBULATORY_CARE_PROVIDER_SITE_OTHER): Payer: BLUE CROSS/BLUE SHIELD | Admitting: Family Medicine

## 2017-03-08 ENCOUNTER — Encounter: Payer: Self-pay | Admitting: Family Medicine

## 2017-03-08 VITALS — BP 140/84 | HR 85 | Temp 97.5°F | Ht 63.0 in | Wt 160.0 lb

## 2017-03-08 DIAGNOSIS — F419 Anxiety disorder, unspecified: Secondary | ICD-10-CM | POA: Diagnosis not present

## 2017-03-08 DIAGNOSIS — K5900 Constipation, unspecified: Secondary | ICD-10-CM | POA: Diagnosis not present

## 2017-03-08 DIAGNOSIS — J392 Other diseases of pharynx: Secondary | ICD-10-CM | POA: Diagnosis not present

## 2017-03-08 NOTE — Progress Notes (Signed)
   HPI  Patient presents today here for follow-up anxiety with constipation.  Patient states constipation seems to be getting worse since starting Lexapro.  She states she has not had a stool in 3 days.  She has abdominal discomfort intermittently. She has not tried medications.  She has tried fiber with some improvement.  She also has some dry throat and frequent throat clearing. That also seems to getting worse since starting Lexapro.  She is having a difficult time saying whether or not the medication is helping.  She denies depression or SI. She has had severe financial difficulties lately and states that is improving.  PMH: Smoking status noted ROS: Per HPI  Objective: BP 140/84   Pulse 85   Temp (!) 97.5 F (36.4 C) (Oral)   Ht 5\' 3"  (1.6 m)   Wt 160 lb (72.6 kg)   BMI 28.34 kg/m  Gen: NAD, alert, cooperative with exam HEENT: NCAT, nares with slight erythema CV: RRR, good S1/S2, no murmur Resp: CTABL, no wheezes, non-labored Ext: No edema, warm Neuro: Alert and oriented, No gross deficits  Assessment and plan:  #Anxiety Stable, slightly improved. Discontinue Lexapro per below  #Constipation Possible drug effect. Discussed supportive care Stop Lexapro  #Dry throat With frequent throat clearing, possibly postnasal drip, also possibly medication side effect Stop Lexapro Return to clinic as needed   Murtis SinkSam Nyzir Dubois, MD [facility] 03/08/2017, 4:39 PM

## 2017-03-08 NOTE — Patient Instructions (Signed)
Great to see you!   

## 2017-03-15 ENCOUNTER — Other Ambulatory Visit: Payer: Self-pay | Admitting: *Deleted

## 2017-03-15 MED ORDER — ESCITALOPRAM OXALATE 10 MG PO TABS: 10.0000 mg | ORAL_TABLET | Freq: Every day | ORAL | 0 refills | Status: DC

## 2017-03-24 ENCOUNTER — Telehealth: Payer: Self-pay | Admitting: Family Medicine

## 2017-03-27 MED ORDER — MIRTAZAPINE 15 MG PO TABS: 15.0000 mg | ORAL_TABLET | Freq: Every day | ORAL | 3 refills | Status: DC

## 2017-03-27 NOTE — Telephone Encounter (Signed)
I have sent remeron, follow up in 1 month  Ann SinkSam [name], MD [facility] 03/27/2017, 11:22 AM

## 2017-03-27 NOTE — Telephone Encounter (Signed)
Patient aware of medication change. 

## 2017-05-12 ENCOUNTER — Encounter: Payer: Self-pay | Admitting: Pediatrics

## 2017-05-12 ENCOUNTER — Ambulatory Visit: Payer: BLUE CROSS/BLUE SHIELD | Admitting: Pediatrics

## 2017-05-12 VITALS — BP 133/81 | HR 85 | Temp 97.4°F | Ht 63.0 in | Wt 167.8 lb

## 2017-05-12 DIAGNOSIS — G8929 Other chronic pain: Secondary | ICD-10-CM | POA: Diagnosis not present

## 2017-05-12 DIAGNOSIS — F418 Other specified anxiety disorders: Secondary | ICD-10-CM

## 2017-05-12 DIAGNOSIS — Z6829 Body mass index (BMI) 29.0-29.9, adult: Secondary | ICD-10-CM

## 2017-05-12 DIAGNOSIS — M545 Low back pain, unspecified: Secondary | ICD-10-CM

## 2017-05-12 NOTE — Progress Notes (Signed)
  Subjective:   Patient ID: Ann Snyder, female    DOB: 11/04/1950, 67 y.o.   MRN: [id number] CC: Back pain  HPI: Ann Snyder is a 67 y.o. 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  Goes away with rest.  Has not taken anything for the pain.  Pain does not prevent her from doing anything that she would usually be doing. Patient with no history of cancer, no trouble with bladder or bowel function, no fevers, normal appetite.  She has had a coworker with breast cancer that was metastatic to her hip, patient worried the back pain may signify something more significant  Patient is up-to-date on age-appropriate cancer screening other than Pap smear which she is aware of  Elevated BMI: Patient very interested in further weight loss, she mentioned several different things that she has been doing to try to help recently She is hoping that this is going to help with the back pain as well  Relevant past medical, surgical, family and social history reviewed. Allergies and medications reviewed and updated. Social History   Tobacco Use  Smoking Status Never Smoker  Smokeless Tobacco Never Used   ROS: Per HPI   Objective:    BP 133/81   Pulse 85   Temp (!) 97.4 F (36.3 C) (Oral)   Ht 5\' 3"  (1.6 m)   Wt 167 lb 12.8 oz (76.1 kg)   BMI 29.72 kg/m   Wt Readings from Last 3 Encounters:  05/12/17 167 lb 12.8 oz (76.1 kg)  03/08/17 160 lb (72.6 kg)  12/20/16 159 lb (72.1 kg)    Gen: NAD, alert, cooperative with exam, NCAT EYES: EOMI, no conjunctival injection, or no icterus ENT:  OP without erythema LYMPH: no cervical LAD CV: NRRR, normal S1/S2, no murmur, distal pulses 2+ b/l Resp:  CTABL, no wheezes, normal WOB Abd: +BS, soft, NTND. no guarding or organomegaly Ext: No edema, warm Neuro: Alert and oriented, strength equal flexion and extension at the knee, hip flexion bilaterally, coordination grossly normal, sensation intact bilaterally, slight pain R lower back with SLR b/l MSK: No point tenderness over spine, tender to palpation right lower back paraspinal muscles Normal hip range of motion bilaterally, no pain with range of motion, no tenderness to palpation over greater trochanters bilaterally  Assessment & Plan:  Ann Snyder was seen today for back pain.  Diagnoses and all orders for this visit:  Chronic right-sided low back pain without sciatica Gave gentle back exercises, trial NSAIDs, rest, avoid activities that make back pain worse Will refer to physical therapy if not improving  BMI 29.0-29.9,adult Continue lifestyle changes, diet changes  Depression  Thinks she has been feeling better since starting on the mirtazapine Will follow-up with PCP  I spent 25 minutes with the patient with over 50% of the encounter time dedicated to counseling on the above problems.  Follow up plan: Return if symptoms worsen or fail to improve. Rex Krasarol Vincent, MD [facility]

## 2017-05-12 NOTE — Patient Instructions (Signed)
Back Exercises If you have pain in your back, do these exercises 2-3 times each day or as told by your doctor. When the pain goes away, do the exercises once each day, but repeat the steps more times for each exercise (do more repetitions). If you do not have pain in your back, do these exercises once each day or as told by your doctor. Exercises Single Knee to Chest  Do these steps 3-5 times in a row for each leg: 1. Lie on your back on a firm bed or the floor with your legs stretched out. 2. Bring one knee to your chest. 3. Hold your knee to your chest by grabbing your knee or thigh. 4. Pull on your knee until you feel a gentle stretch in your lower back. 5. Keep doing the stretch for 10-30 seconds. 6. Slowly let go of your leg and straighten it.  Pelvic Tilt  Do these steps 5-10 times in a row: 1. Lie on your back on a firm bed or the floor with your legs stretched out. 2. Bend your knees so they point up to the ceiling. Your feet should be flat on the floor. 3. Tighten your lower belly (abdomen) muscles to press your lower back against the floor. This will make your tailbone point up to the ceiling instead of pointing down to your feet or the floor. 4. Stay in this position for 5-10 seconds while you gently tighten your muscles and breathe evenly.  Cat-Cow  Do these steps until your lower back bends more easily: 1. Get on your hands and knees on a firm surface. Keep your hands under your shoulders, and keep your knees under your hips. You may put padding under your knees. 2. Let your head hang down, and make your tailbone point down to the floor so your lower back is round like the back of a cat. 3. Stay in this position for 5 seconds. 4. Slowly lift your head and make your tailbone point up to the ceiling so your back hangs low (sags) like the back of a cow. 5. Stay in this position for 5 seconds.  Press-Ups  Do these steps 5-10 times in a row: 1. Lie on your belly (face-down)  on the floor. 2. Place your hands near your head, about shoulder-width apart. 3. While you keep your back relaxed and keep your hips on the floor, slowly straighten your arms to raise the top half of your body and lift your shoulders. Do not use your back muscles. To make yourself more comfortable, you may change where you place your hands. 4. Stay in this position for 5 seconds. 5. Slowly return to lying flat on the floor.  Bridges  Do these steps 10 times in a row: 1. Lie on your back on a firm surface. 2. Bend your knees so they point up to the ceiling. Your feet should be flat on the floor. 3. Tighten your butt muscles and lift your butt off of the floor until your waist is almost as high as your knees. If you do not feel the muscles working in your butt and the back of your thighs, slide your feet 1-2 inches farther away from your butt. 4. Stay in this position for 3-5 seconds. 5. Slowly lower your butt to the floor, and let your butt muscles relax.  If this exercise is too easy, try doing it with your arms crossed over your chest. Back Lifts Do these steps 5-10 times in a   row: 1. Lie on your belly (face-down) with your arms at your sides, and rest your forehead on the floor. 2. Tighten the muscles in your legs and your butt. 3. Slowly lift your chest off of the floor while you keep your hips on the floor. Keep the back of your head in line with the curve in your back. Look at the floor while you do this. 4. Stay in this position for 3-5 seconds. 5. Slowly lower your chest and your face to the floor.  Contact a doctor if:  Your back pain gets a lot worse when you do an exercise.  Your back pain does not lessen 2 hours after you exercise. If you have any of these problems, stop doing the exercises. Do not do them again unless your doctor says it is okay. Get help right away if:  You have sudden, very bad back pain. If this happens, stop doing the exercises. Do not do them again  unless your doctor says it is okay. This information is not intended to replace advice given to you by your health care provider. Make sure you discuss any questions you have with your health care provider. Document Released: 05/28/2010 Document Revised: 10/01/2015 Document Reviewed: 06/19/2014 Elsevier Interactive Patient Education  2018 [employer]vier Inc.   

## 2017-05-17 ENCOUNTER — Other Ambulatory Visit: Payer: Self-pay | Admitting: *Deleted

## 2017-05-17 MED ORDER — MIRTAZAPINE 15 MG PO TABS: 15.0000 mg | ORAL_TABLET | Freq: Every day | ORAL | 0 refills | Status: DC

## 2017-06-27 DIAGNOSIS — M79674 Pain in right toe(s): Secondary | ICD-10-CM | POA: Diagnosis not present

## 2017-06-27 DIAGNOSIS — B351 Tinea unguium: Secondary | ICD-10-CM | POA: Diagnosis not present

## 2017-07-10 ENCOUNTER — Other Ambulatory Visit (HOSPITAL_COMMUNITY)
Admission: RE | Admit: 2017-07-10 | Discharge: 2017-07-10 | Disposition: A | Discharge status: Home / Self Care | Payer: BLUE CROSS/BLUE SHIELD | Source: Ambulatory Visit | Attending: Podiatry | Admitting: Podiatry

## 2017-07-10 DIAGNOSIS — B351 Tinea unguium: Secondary | ICD-10-CM | POA: Diagnosis not present

## 2017-07-10 LAB — HEPATIC FUNCTION PANEL
ALT: 33 U/L (ref 14–54)
AST: 23 U/L (ref 15–41)
Albumin: 4 g/dL (ref 3.5–5.0)
Alkaline Phosphatase: 87 U/L (ref 38–126)
BILIRUBIN DIRECT: 0.1 mg/dL (ref 0.1–0.5)
BILIRUBIN INDIRECT: 0.2 mg/dL — AB (ref 0.3–0.9)
Total Bilirubin: 0.3 mg/dL (ref 0.3–1.2)
Total Protein: 7 g/dL (ref 6.5–8.1)

## 2017-07-25 ENCOUNTER — Other Ambulatory Visit: Payer: Self-pay | Admitting: Family Medicine

## 2017-07-26 ENCOUNTER — Other Ambulatory Visit: Payer: Self-pay | Admitting: Family Medicine

## 2017-08-16 ENCOUNTER — Other Ambulatory Visit: Payer: Self-pay | Admitting: Family Medicine

## 2017-08-23 ENCOUNTER — Ambulatory Visit: Payer: BLUE CROSS/BLUE SHIELD | Admitting: Pediatrics

## 2017-08-23 ENCOUNTER — Telehealth: Payer: Self-pay | Admitting: Family Medicine

## 2017-08-23 ENCOUNTER — Ambulatory Visit: Payer: BLUE CROSS/BLUE SHIELD | Admitting: Family Medicine

## 2017-08-23 ENCOUNTER — Encounter: Payer: Self-pay | Admitting: Family Medicine

## 2017-08-23 VITALS — BP 126/78 | HR 96 | Temp 97.2°F | Ht 63.0 in | Wt 169.8 lb

## 2017-08-23 DIAGNOSIS — S29012A Strain of muscle and tendon of back wall of thorax, initial encounter: Secondary | ICD-10-CM | POA: Diagnosis not present

## 2017-08-23 NOTE — Patient Instructions (Signed)
Great to see you!  Try ice 15 minutes 3 times daily and 1 aleve before work.   Please let me know or call Dr. Berline Choughigby to see him for back pain if you are not improving.

## 2017-08-23 NOTE — Telephone Encounter (Signed)
Pt aware of apt time today

## 2017-08-23 NOTE — Progress Notes (Signed)
   HPI  Patient presents today here with R axillary pain.   Pt states that for approx 1 month she has had R posterior Axillary/upper back pain. Described as sharp pan that happens several times daily mostly at work. Lasts a few minutes.   Denies any injury. She does repetitive heavy lifting at work.   Tylenol helps somewhat.   PMH: Smoking status noted ROS: Per HPI  Objective: BP 126/78   Pulse 96   Temp (!) 97.2 F (36.2 C) (Oral)   Ht 5\' 3"  (1.6 m)   Wt 169 lb 12.8 oz (77 kg)   BMI 30.08 kg/m  Gen: NAD, alert, cooperative with exam HEENT: NCAT CV: RRR, good S1/S2, no murmur Resp: CTABL, no wheezes, non-labored Ext: No edema, warm Neuro: Alert and oriented, No gross deficits MSK:   TTP of R posterior axila at the proximal lat dorsi  Assessment and plan:  # Lat dorsi strain Ice, Low dose NSAIDs, Rest if possible ( recommended 1 aleve once daily while working) Sports med if not improving with conservative Tx.    Murtis SinkSam Vanda Waskey, MD [facility] 08/23/2017, 4:58 PM

## 2017-09-26 ENCOUNTER — Encounter: Payer: Self-pay | Admitting: Family Medicine

## 2017-09-26 ENCOUNTER — Ambulatory Visit: Payer: BLUE CROSS/BLUE SHIELD | Admitting: Family Medicine

## 2017-09-26 VITALS — BP 118/73 | HR 85 | Temp 97.2°F | Ht 63.0 in | Wt 169.8 lb

## 2017-09-26 DIAGNOSIS — S39012A Strain of muscle, fascia and tendon of lower back, initial encounter: Secondary | ICD-10-CM | POA: Diagnosis not present

## 2017-09-26 MED ORDER — CYCLOBENZAPRINE HCL 10 MG PO TABS: 10.0000 mg | ORAL_TABLET | Freq: Three times a day (TID) | ORAL | 0 refills | Status: DC | PRN

## 2017-09-26 NOTE — Progress Notes (Signed)
   HPI  Patient presents today here for back pain.  Patient explains that last Wednesday she was doing vigorous work at work and developed low back pain Thursday morning when she woke up had bilateral low back soreness that persisted through the weekend.  She continued to work.  She denies any leg symptoms or leg weakness.  She has no bowel or bladder dysfunction. She has no radiation of this pain down the leg.  Date of injury 09/20/2017, date of onset of pain is 09/21/2017  PMH: Smoking status noted ROS: Per HPI  Objective: BP 118/73   Pulse 85   Temp (!) 97.2 F (36.2 C) (Oral)   Ht  (1.6 m)   Wt 169 lb 12.8 oz (77 kg)   BMI 30.08 kg/m  Gen: NAD, alert, cooperative with exam HEENT: NCAT CV: RRR, good S1/S2, no murmur Resp: CTABL, no wheezes, non-labored Ext: No edema, warm Neuro: Alert and oriented, No gross deficits  MSK:  MIld TTP midline lumbar area, no paraspinal muscle ttp Negative modified straight leg rasie 2+ PT reflexes BL     Assessment and plan:  # lumbar strain Improving, no red flags Helped by NSAIDs, adding flexeril.  Offerd cortisone injection RTC with any concerns    Meds ordered this encounter  Medications  . cyclobenzaprine (FLEXERIL) 10 MG tablet    Sig: Take 1 tablet (10 mg total) by mouth 3 (three) times daily as needed for muscle spasms.    Dispense:  30 tablet    Refill:  0    Murtis Sink, MD [facility] 09/26/2017, 2:07 PM

## 2017-09-26 NOTE — Patient Instructions (Signed)
Great to see you!  Continue ibuprofen, try flexeril at night.   Come back if you do not improve as expected or if anything gets worse.

## 2017-10-03 DIAGNOSIS — Z1231 Encounter for screening mammogram for malignant neoplasm of breast: Secondary | ICD-10-CM | POA: Diagnosis not present

## 2017-10-03 LAB — HM MAMMOGRAPHY

## 2017-10-20 ENCOUNTER — Other Ambulatory Visit: Payer: Self-pay | Admitting: Family Medicine

## 2017-11-08 DIAGNOSIS — H01001 Unspecified blepharitis right upper eyelid: Secondary | ICD-10-CM | POA: Diagnosis not present

## 2017-11-10 ENCOUNTER — Encounter: Payer: Self-pay | Admitting: Family

## 2017-11-10 ENCOUNTER — Ambulatory Visit: Payer: BLUE CROSS/BLUE SHIELD | Admitting: Family

## 2017-11-10 VITALS — BP 129/83 | HR 98 | Temp 97.2°F | Ht 63.0 in | Wt 173.0 lb

## 2017-11-10 DIAGNOSIS — H01001 Unspecified blepharitis right upper eyelid: Secondary | ICD-10-CM | POA: Diagnosis not present

## 2017-11-10 MED ORDER — FLUOROMETHOLONE 0.1 % OP SUSP: 1.0000 [drp] | Freq: Four times a day (QID) | OPHTHALMIC | 0 refills | Status: AC

## 2017-11-10 MED ORDER — DOXYCYCLINE HYCLATE 100 MG PO TABS: 100.0000 mg | ORAL_TABLET | Freq: Two times a day (BID) | ORAL | 0 refills | Status: DC

## 2017-11-10 NOTE — Progress Notes (Signed)
   Subjective:    Patient ID: Ann RakersDeborah J Remley, female    DOB: 09/11/1950, 67 y.o.   MRN: [id number]  Chief Complaint  Patient presents with  . Facial Swelling    Right eye  . eye redness    Right eye    Eye Problem   The right eye is affected. This is a new problem. The current episode started in the past 7 days. The problem occurs constantly. There was no injury mechanism. The pain is at a severity of 2/10. The pain is mild. Associated symptoms include blurred vision, an eye discharge and itching. Pertinent negatives include no double vision, fever, nausea or vomiting. Treatments tried: erythromycin ointment since 11/08/17. The treatment provided mild relief.      Review of Systems  Constitutional: Negative for fever.  Eyes: Positive for blurred vision, discharge and itching. Negative for double vision.  Gastrointestinal: Negative for nausea and vomiting.  All other systems reviewed and are negative.      Objective:   Physical Exam  Constitutional: She is oriented to person, place, and time. She appears well-developed and well-nourished. No distress.  HENT:  Head: Normocephalic and atraumatic.  Right Ear: External ear normal.  Mouth/Throat: Oropharynx is clear and moist.  Eyes: Pupils are equal, round, and reactive to light. Right eye discharge: right upper lid erythemas, tender, and swollen.  Neck: Normal range of motion. Neck supple. No thyromegaly present.  Cardiovascular: Normal rate, regular rhythm, normal heart sounds and intact distal pulses.  No murmur heard. Pulmonary/Chest: Effort normal and breath sounds normal. No respiratory distress. She has no wheezes.  Abdominal: Soft. Bowel sounds are normal. She exhibits no distension. There is no tenderness.  Musculoskeletal: Normal range of motion. She exhibits no edema or tenderness.  Neurological: She is alert and oriented to person, place, and time. She has normal reflexes. No cranial nerve deficit.  Skin: Skin is  warm and dry.  Psychiatric: She has a normal mood and affect. Her behavior is normal. Judgment and thought content normal.  Vitals reviewed.     BP 129/83   Pulse 98   Temp (!) 97.2 F (36.2 C) (Oral)   Ht 5\' 3"  (1.6 m)   Wt 173 lb (78.5 kg)   BMI 30.65 kg/m      Assessment & Plan:  [name] was seen today for facial swelling and eye redness.  Diagnoses and all orders for this visit:  Blepharitis of right upper eyelid, unspecified type -     doxycycline (VIBRA-TABS) 100 MG tablet; Take 1 tablet (100 mg total) by mouth 2 (two) times daily. -     fluorometholone (FML) 0.1 % ophthalmic suspension; Place 1 drop into the right eye 4 (four) times daily for 10 days.   Warm Compresses Good hand hygiene  Continue erythromycin ointment Follow up with Opth if does not improve  Jannifer Rodneyhristy Ayomide Zuleta, FNP

## 2017-11-10 NOTE — Patient Instructions (Signed)
Blepharitis  Blepharitis is inflammation of the eyelids. Blepharitis may happen with:   Reddish, scaly skin around the scalp and eyebrows.   Burning or itching of the eyelids.   Eye discharge at night that causes the eyelashes to stick together in the morning.   Eyelashes that fall out.   Sensitivity to light.    Follow these instructions at home:  Pay attention to any changes in how you look or feel. Follow these instructions to help with your condition:  Keeping Clean   Wash your hands often.   Wash your eyelids with warm water or with warm water that is mixed with a small amount of baby shampoo. Do this two times per day or as often as needed.   Wash your face and eyebrows at least once a day.   Use a clean towel each time you dry your eyelids. Do not use this towel to clean or dry other areas of your body. Do not share your towel with anyone.  General instructions   Avoid wearing makeup until you get better. Do not share makeup with anyone.   Avoid rubbing your eyes.   Apply warm compresses to your eyes 2 times per day for 10 minutes at a time, or as told by your health care provider.   If you were prescribed an antibiotic ointment or steroid drops, apply or use the medicine as told by your health care provider. Do not stop using the medicine even if you feel better.   Keep all follow-up visits as told by your health care provider. This is important.  Contact a health care provider if:   Your eyelids feel hot.   You have blisters or a rash on your eyelids.   The condition does not go away in 2-4 days.   The inflammation gets worse.  Get help right away if:   You have pain or redness that gets worse or spreads to other parts of your face.   Your vision changes.   You have pain when looking at lights or moving objects.   You have a fever.  This information is not intended to replace advice given to you by your health care provider. Make sure you discuss any questions you have with your health  care provider.  Document Released: 04/22/2000 Document Revised: 10/01/2015 Document Reviewed: 08/18/2014  Elsevier Interactive Patient Education  2018 Elsevier Inc.

## 2017-11-15 ENCOUNTER — Other Ambulatory Visit: Payer: Self-pay | Admitting: Family Medicine

## 2017-11-23 ENCOUNTER — Other Ambulatory Visit: Payer: Self-pay | Admitting: Pediatrics

## 2017-11-23 DIAGNOSIS — I10 Essential (primary) hypertension: Secondary | ICD-10-CM

## 2017-12-01 ENCOUNTER — Telehealth: Payer: Self-pay | Admitting: Family Medicine

## 2017-12-01 MED ORDER — MIRTAZAPINE 30 MG PO TABS: 30.0000 mg | ORAL_TABLET | Freq: Every day | ORAL | 1 refills | Status: DC

## 2017-12-01 NOTE — Telephone Encounter (Signed)
Pt informed of recommendation Verbalizes understanding 

## 2017-12-01 NOTE — Telephone Encounter (Signed)
Please increase mirtazepine (Remeron) to 30 mg qhs for sleep

## 2017-12-15 ENCOUNTER — Other Ambulatory Visit: Payer: Self-pay | Admitting: Family Medicine

## 2017-12-26 MED ORDER — MIRTAZAPINE 30 MG PO TABS: 30.0000 mg | ORAL_TABLET | Freq: Every day | ORAL | 0 refills | Status: DC

## 2017-12-26 NOTE — Addendum Note (Signed)
Addended by: Julious PayerHOLT, CATHERINE D on: 12/26/2017 09:25 AM   Modules accepted: Orders

## 2017-12-26 NOTE — Telephone Encounter (Signed)
Resent failed refill from 12/15/17

## 2018-01-26 ENCOUNTER — Other Ambulatory Visit: Payer: Self-pay | Admitting: *Deleted

## 2018-01-26 MED ORDER — ATORVASTATIN CALCIUM 20 MG PO TABS: ORAL_TABLET | ORAL | 0 refills | Status: DC

## 2018-02-19 ENCOUNTER — Other Ambulatory Visit: Payer: Self-pay | Admitting: Nurse Practitioner

## 2018-02-20 NOTE — Telephone Encounter (Signed)
Last lipid 12/20/16  Dr Ermalinda Memos

## 2018-03-21 ENCOUNTER — Encounter: Payer: Self-pay | Admitting: Pediatrics

## 2018-03-21 ENCOUNTER — Ambulatory Visit: Payer: BLUE CROSS/BLUE SHIELD | Admitting: Pediatrics

## 2018-03-21 VITALS — BP 135/84 | HR 87 | Temp 97.0°F | Ht 63.0 in | Wt 175.8 lb

## 2018-03-21 DIAGNOSIS — M545 Low back pain: Secondary | ICD-10-CM | POA: Diagnosis not present

## 2018-03-21 DIAGNOSIS — I10 Essential (primary) hypertension: Secondary | ICD-10-CM

## 2018-03-21 DIAGNOSIS — G8929 Other chronic pain: Secondary | ICD-10-CM

## 2018-03-21 DIAGNOSIS — E785 Hyperlipidemia, unspecified: Secondary | ICD-10-CM | POA: Diagnosis not present

## 2018-03-21 MED ORDER — CYCLOBENZAPRINE HCL 5 MG PO TABS: 2.5000 mg | ORAL_TABLET | Freq: Three times a day (TID) | ORAL | 0 refills | Status: DC | PRN

## 2018-03-21 NOTE — Patient Instructions (Signed)
Naproxen 1-2 tabs 1-2 times a day as needed Cyclobenzaprine (muscle relaxer) 1/2-1 tab as needed. Can cause drowsiness, do not take and drive  Back Exercises If you have pain in your back, do these exercises 2-3 times each day or as told by your doctor. When the pain goes away, do the exercises once each day, but repeat the steps more times for each exercise (do more repetitions). If you do not have pain in your back, do these exercises once each day or as told by your doctor. Exercises Single Knee to Chest  Do these steps 3-5 times in a row for each leg: 1. Lie on your back on a firm bed or the floor with your legs stretched out. 2. Bring one knee to your chest. 3. Hold your knee to your chest by grabbing your knee or thigh. 4. Pull on your knee until you feel a gentle stretch in your lower back. 5. Keep doing the stretch for 10-30 seconds. 6. Slowly let go of your leg and straighten it.  Pelvic Tilt  Do these steps 5-10 times in a row: 1. Lie on your back on a firm bed or the floor with your legs stretched out. 2. Bend your knees so they point up to the ceiling. Your feet should be flat on the floor. 3. Tighten your lower belly (abdomen) muscles to press your lower back against the floor. This will make your tailbone point up to the ceiling instead of pointing down to your feet or the floor. 4. Stay in this position for 5-10 seconds while you gently tighten your muscles and breathe evenly.  Cat-Cow  Do these steps until your lower back bends more easily: 1. Get on your hands and knees on a firm surface. Keep your hands under your shoulders, and keep your knees under your hips. You may put padding under your knees. 2. Let your head hang down, and make your tailbone point down to the floor so your lower back is round like the back of a cat. 3. Stay in this position for 5 seconds. 4. Slowly lift your head and make your tailbone point up to the ceiling so your back hangs low (sags) like  the back of a cow. 5. Stay in this position for 5 seconds.  Press-Ups  Do these steps 5-10 times in a row: 1. Lie on your belly (face-down) on the floor. 2. Place your hands near your head, about shoulder-width apart. 3. While you keep your back relaxed and keep your hips on the floor, slowly straighten your arms to raise the top half of your body and lift your shoulders. Do not use your back muscles. To make yourself more comfortable, you may change where you place your hands. 4. Stay in this position for 5 seconds. 5. Slowly return to lying flat on the floor.  Bridges  Do these steps 10 times in a row: 1. Lie on your back on a firm surface. 2. Bend your knees so they point up to the ceiling. Your feet should be flat on the floor. 3. Tighten your butt muscles and lift your butt off of the floor until your waist is almost as high as your knees. If you do not feel the muscles working in your butt and the back of your thighs, slide your feet 1-2 inches farther away from your butt. 4. Stay in this position for 3-5 seconds. 5. Slowly lower your butt to the floor, and let your butt muscles relax.  If this exercise is too easy, try doing it with your arms crossed over your chest. Back Lifts Do these steps 5-10 times in a row: 1. Lie on your belly (face-down) with your arms at your sides, and rest your forehead on the floor. 2. Tighten the muscles in your legs and your butt. 3. Slowly lift your chest off of the floor while you keep your hips on the floor. Keep the back of your head in line with the curve in your back. Look at the floor while you do this. 4. Stay in this position for 3-5 seconds. 5. Slowly lower your chest and your face to the floor.  Contact a doctor if:  Your back pain gets a lot worse when you do an exercise.  Your back pain does not lessen 2 hours after you exercise. If you have any of these problems, stop doing the exercises. Do not do them again unless your doctor  says it is okay. Get help right away if:  You have sudden, very bad back pain. If this happens, stop doing the exercises. Do not do them again unless your doctor says it is okay. This information is not intended to replace advice given to you by your health care provider. Make sure you discuss any questions you have with your health care provider. Document Released: 05/28/2010 Document Revised: 10/01/2015 Document Reviewed: 06/19/2014 Elsevier Interactive Patient Education  [employer]2018 [employer] Inc.

## 2018-03-21 NOTE — Progress Notes (Signed)
  Subjective:   Patient ID: Ann Snyder, female    DOB: [birth year], 67 y.o.   MRN: [id number] CC: Low back pain (Bilateral, Shooting pains, )  HPI: Ann Snyder is a 67 y.o. female   Works as a Programme researcher, broadcasting/film/video.  On her feet much of the day.  Often carrying heavy trays full classes.  She is been standing for a while, sometimes gets sharp shooting pains in her lower back.  Sometimes feels like it comes out of the blue.  She has to pause with what she is doing when the pain comes on.  Not regularly doing any exercise outside of work.  Hypertension: Taking medicine regularly.  No chest pain or shortness of breath.  Hyperlipidemia: Taking atorvastatin regularly, no side effects.  Relevant past medical, surgical, family and social history reviewed. Allergies and medications reviewed and updated. Social History   Tobacco Use  Smoking Status Never Smoker  Smokeless Tobacco Never Used   ROS: Per HPI   Objective:    BP 135/84   Pulse 87   Temp (!) 97 F (36.1 C) (Oral)   Ht 5' 3" (1.6 m)   Wt 175 lb 12.8 oz (79.7 kg)   BMI 31.14 kg/m   Wt Readings from Last 3 Encounters:  03/21/18 175 lb 12.8 oz (79.7 kg)  11/10/17 173 lb (78.5 kg)  09/26/17 169 lb 12.8 oz (77 kg)    Gen: NAD, alert, cooperative with exam, NCAT EYES: EOMI, no conjunctival injection, or no icterus CV: NRRR, normal S1/S2, no murmur, distal pulses 2+ b/l Resp: CTABL, no wheezes, normal WOB Abd: +BS, soft, NTND. no guarding or organomegaly Ext: No edema, warm Neuro: Alert and oriented, strength equal b/l UE and LE, coordination grossly normal MSK: normal range of motion bilateral hips.  No pain with range of motion of hips.  No tenderness to palpation over spine.  Mildly tender to palpation over bilateral lower back paraspinal muscles.  Assessment & Plan:  [name] was seen today for hip pain.  Diagnoses and all orders for this visit:  Chronic bilateral low back pain without sciatica Gentle back exercises,  referral to physical therapy.  Continue efforts towards weight loss. -     Ambulatory referral to Physical Therapy -     cyclobenzaprine (FLEXERIL) 5 MG tablet; Take 0.5-1 tablets (2.5-5 mg total) by mouth 3 (three) times daily as needed for muscle spasms.  Essential hypertension Stable, due for labs.  Continue current medicine. -     BMP8+EGFR; Future  Hyperlipidemia, unspecified hyperlipidemia type Stable, tolerating statin, will return for labs -     Lipid panel; Future   Follow up plan: Return in about 4 weeks (around 04/18/2018) for well visit . Assunta Found, MD [place]

## 2018-03-23 ENCOUNTER — Other Ambulatory Visit: Payer: Self-pay | Admitting: *Deleted

## 2018-03-23 DIAGNOSIS — I10 Essential (primary) hypertension: Secondary | ICD-10-CM

## 2018-03-23 MED ORDER — LISINOPRIL 10 MG PO TABS: 10.0000 mg | ORAL_TABLET | Freq: Every day | ORAL | 1 refills | Status: DC

## 2018-03-26 ENCOUNTER — Other Ambulatory Visit: Payer: BLUE CROSS/BLUE SHIELD

## 2018-03-26 DIAGNOSIS — E785 Hyperlipidemia, unspecified: Secondary | ICD-10-CM | POA: Diagnosis not present

## 2018-03-26 DIAGNOSIS — I10 Essential (primary) hypertension: Secondary | ICD-10-CM

## 2018-03-27 LAB — LIPID PANEL
CHOL/HDL RATIO: 4.1 ratio (ref 0.0–4.4)
Cholesterol, Total: 155 mg/dL (ref 100–199)
HDL: 38 mg/dL — ABNORMAL LOW (ref >39)
LDL CALC: 61 mg/dL (ref 0–99)
Triglycerides: 279 mg/dL — ABNORMAL HIGH (ref 0–149)
VLDL CHOLESTEROL CAL: 56 mg/dL — AB (ref 5–40)

## 2018-03-27 LAB — BMP8+EGFR
BUN/Creatinine Ratio: 21 (ref 12–28)
BUN: 15 mg/dL (ref 8–27)
CO2: 21 mmol/L (ref 20–29)
CREATININE: 0.73 mg/dL (ref 0.57–1.00)
Calcium: 9.3 mg/dL (ref 8.7–10.3)
Chloride: 103 mmol/L (ref 96–106)
GFR calc non Af Amer: 86 mL/min/{1.73_m2} (ref >59)
GFR, EST AFRICAN AMERICAN: 99 mL/min/{1.73_m2} (ref >59)
Glucose: 97 mg/dL (ref 65–99)
Potassium: 4.4 mmol/L (ref 3.5–5.2)
SODIUM: 141 mmol/L (ref 134–144)

## 2018-03-30 ENCOUNTER — Other Ambulatory Visit: Payer: Self-pay | Admitting: Family Medicine

## 2018-04-02 ENCOUNTER — Telehealth: Payer: Self-pay | Admitting: Pediatrics

## 2018-04-04 ENCOUNTER — Other Ambulatory Visit: Payer: Self-pay | Admitting: Family

## 2018-04-10 NOTE — Telephone Encounter (Signed)
Attempted to call pt without return call in over 3 days, will close encounter. 

## 2018-05-15 ENCOUNTER — Other Ambulatory Visit: Payer: Self-pay | Admitting: Family

## 2018-05-31 ENCOUNTER — Encounter: Payer: Self-pay | Admitting: Family Medicine

## 2018-05-31 ENCOUNTER — Ambulatory Visit: Payer: BLUE CROSS/BLUE SHIELD | Admitting: Family Medicine

## 2018-05-31 VITALS — BP 132/80 | HR 60 | Temp 97.8°F | Ht 63.0 in | Wt 175.8 lb

## 2018-05-31 DIAGNOSIS — R6884 Jaw pain: Secondary | ICD-10-CM | POA: Diagnosis not present

## 2018-05-31 NOTE — Patient Instructions (Signed)
Your EKG did not demonstrate signs of ischemia that would suggest a heart attack.  We discussed that there are instances that heart attacks do not show up on EKG.  However, we did discuss reasons for emergent evaluation the emergency department.  I would like you to trial Tums at the next time you have recurrence of symptoms.  If you decide you do want to have repeat stress testing, contact me and I will place a referral to cardiology.

## 2018-05-31 NOTE — Progress Notes (Signed)
Subjective: CC: Jaw pain PCP: Raliegh Ip, DO JOI:NOMVEHM Ann Snyder is a 68 y.o. female presenting to clinic today for:  1.  Jaw pain Patient reports left-sided jaw pain that radiates to the left neck and left upper back.  Symptoms of jaw pain typically lasts a couple of seconds then resolve but the associated upper back pain tends to go on for a couple of hours.  This has been ongoing for over a year now and occurs at random.  Last occurrence was last evening.  She denies association with physical activity, food.  Denies any associated diaphoresis, nausea, vomiting, abdominal pain, dizziness, or shortness of breath.  No chest pain.  No visual disturbance.  She does not think that she grinds her teeth at night but is unsure.  She is a widow.  She sees a Education officer, community regularly.  Denies any locking or clicking of the jaw.  She does have medical history significant for hyperlipidemia, hypertension and OSA that is not treated with CPAP because she has been unable to afford this.   ROS: Per HPI  No Known Allergies Past Medical History:  Diagnosis Date  . Anxiety   . Depression   . Essential hypertension 12/20/2016  . Fungal infection    right big toe  . Headache(784.0)   . High cholesterol   . OSA (obstructive sleep apnea) 10/07/2014   Moderate with AHI 20/hr    Current Outpatient Medications:  .  atorvastatin (LIPITOR) 20 MG tablet, TAKE 1 TABLET BY MOUTH EVERY DAY, Disp: 30 tablet, Rfl: 5 .  cyclobenzaprine (FLEXERIL) 5 MG tablet, Take 0.5-1 tablets (2.5-5 mg total) by mouth 3 (three) times daily as needed for muscle spasms., Disp: 30 tablet, Rfl: 0 .  lisinopril (PRINIVIL,ZESTRIL) 10 MG tablet, Take 1 tablet (10 mg total) by mouth daily., Disp: 90 tablet, Rfl: 1 .  mirtazapine (REMERON) 30 MG tablet, TAKE 1 TABLET (30 MG TOTAL) BY MOUTH AT BEDTIME. (NEEDS TO BE SEEN BEFORE NEXT REFILL), Disp: 30 tablet, Rfl: 0 .  VASCEPA 1 g CAPS, TAKE 2 CAPSULES BY MOUTH 2 (TWO) TIMES DAILY. WITH  OR FOLLOWING MEALS, Disp: 120 capsule, Rfl: 11 Social History   Socioeconomic History  . Marital status: Widowed    Spouse name: Not on file  . Number of children: Not on file  . Years of education: Not on file  . Highest education level: Not on file  Occupational History  . Not on file  Social Needs  . Financial resource strain: Not on file  . Food insecurity:    Worry: Not on file    Inability: Not on file  . Transportation needs:    Medical: Not on file    Non-medical: Not on file  Tobacco Use  . Smoking status: Never Smoker  . Smokeless tobacco: Never Used  Substance and Sexual Activity  . Alcohol use: No  . Drug use: No  . Sexual activity: Not on file  Lifestyle  . Physical activity:    Days per week: Not on file    Minutes per session: Not on file  . Stress: Not on file  Relationships  . Social connections:    Talks on phone: Not on file    Gets together: Not on file    Attends religious service: Not on file    Active member of club or organization: Not on file    Attends meetings of clubs or organizations: Not on file    Relationship status: Not on  file  . Intimate partner violence:    Fear of current or ex partner: Not on file    Emotionally abused: Not on file    Physically abused: Not on file    Forced sexual activity: Not on file  Other Topics Concern  . Not on file  Social History Narrative  . Not on file   Family History  Problem Relation Age of Onset  . Anxiety disorder Father   . ADD / ADHD Neg Hx   . Alcohol abuse Neg Hx   . Drug abuse Neg Hx   . Bipolar disorder Neg Hx   . Depression Neg Hx   . Dementia Neg Hx   . OCD Neg Hx   . Paranoid behavior Neg Hx   . Schizophrenia Neg Hx   . Seizures Neg Hx   . Physical abuse Neg Hx   . Sexual abuse Neg Hx     Objective: Office vital signs reviewed. BP 132/80   Pulse 60   Temp 97.8 F (36.6 C) (Oral)   Ht 5\' 3"  (1.6 m)   Wt 175 lb 12.8 oz (79.7 kg)   BMI 31.14 kg/m   Physical  Examination:  General: Awake, alert, well nourished, No acute distress HEENT: Normal    Neck: No masses palpated. No lymphadenopathy.  No carotid bruits.    Throat: moist mucus membranes, dentition fair.  No palpable clicks within range of motion testing of the jaw.  No palpable deformity. Cardio: regular rate and rhythm, S1S2 heard, no murmurs appreciated Pulm: clear to auscultation bilaterally, no wheezes, rhonchi or rales; normal work of breathing on room air  Assessment/ Plan: 68 y.o. female   1. Jaw pain Uncertain etiology at this point.  May be musculoskeletal in nature versus referred pain from acid reflux versus cardiac in nature.  Her EKG did not demonstrate signs of ischemia during today's visit.  Per her report during the visit she has had stress testing in the past and this was noted to be low risk.  I did offer referral for repeat stress testing since it has been a few years but she declined this during today's visit.  We discussed signs and symptoms of MI and noted that this can be different in females.  Should she demonstrate any signs or symptoms that do not resolve or are worrisome she should seek immediate medical attention the emergency department.  For now, I have recommended that she consider trial of acid relieving medication if symptoms reccur. - EKG 12-Lead   Orders Placed This Encounter  Procedures  . EKG 12-Lead   No orders of the defined types were placed in this encounter.    Raliegh Ip, DO [facility] [phone]

## 2018-06-07 ENCOUNTER — Other Ambulatory Visit: Payer: Self-pay | Admitting: Family

## 2018-09-18 ENCOUNTER — Ambulatory Visit (INDEPENDENT_AMBULATORY_CARE_PROVIDER_SITE_OTHER): Payer: BLUE CROSS/BLUE SHIELD | Admitting: Family Medicine

## 2018-09-18 ENCOUNTER — Other Ambulatory Visit: Payer: Self-pay

## 2018-09-18 DIAGNOSIS — G8929 Other chronic pain: Secondary | ICD-10-CM

## 2018-09-18 DIAGNOSIS — M545 Low back pain: Secondary | ICD-10-CM

## 2018-09-18 NOTE — Progress Notes (Signed)
Telephone visit  Subjective: BJ:SEGB pain PCP: Ann Ip, DO TDV:VOHYWVP Ann Snyder is a 68 y.o. female calls for telephone consult today. Patient provides verbal consent for consult held via phone.  Location of patient: work Location of provider: Working remotely from home Others present for call: none  1. Side pain Patient reports onset of symptoms over a year ago but notes that this has been intermittent.  Most recent flare started Sunday and continued into Monday.  She describes it as a dull pain that is exacerbated by getting up from a seated or lying position.  Denies any lower extremity weakness, numbness or tingling.  No radiation beyond the left upper buttock and hip area.  No radiation into the groin.  She took some ibuprofen last evening which did seem to help as her symptoms are not as bad today.  She works as a Production assistant, radio in an assisted living facility.   ROS: Per HPI  No Known Allergies Past Medical History:  Diagnosis Date  . Anxiety   . Depression   . Essential hypertension 12/20/2016  . Fungal infection    right big toe  . Headache(784.0)   . High cholesterol   . OSA (obstructive sleep apnea) 10/07/2014   Moderate with AHI 20/hr    Current Outpatient Medications:  .  atorvastatin (LIPITOR) 20 MG tablet, TAKE 1 TABLET BY MOUTH EVERY DAY, Disp: 30 tablet, Rfl: 5 .  cyclobenzaprine (FLEXERIL) 5 MG tablet, Take 0.5-1 tablets (2.5-5 mg total) by mouth 3 (three) times daily as needed for muscle spasms., Disp: 30 tablet, Rfl: 0 .  lisinopril (PRINIVIL,ZESTRIL) 10 MG tablet, Take 1 tablet (10 mg total) by mouth daily., Disp: 90 tablet, Rfl: 1 .  mirtazapine (REMERON) 30 MG tablet, TAKE 1 TABLET (30 MG TOTAL) BY MOUTH AT BEDTIME. (NEEDS TO BE SEEN BEFORE NEXT REFILL), Disp: 90 tablet, Rfl: 1 .  VASCEPA 1 g CAPS, TAKE 2 CAPSULES BY MOUTH 2 (TWO) TIMES DAILY. WITH OR FOLLOWING MEALS, Disp: 120 capsule, Rfl: 11  Assessment/ Plan: 68 y.o. female   1. Chronic  left-sided low back pain without sciatica I have advised her to resume use of Motrin up to 3 times daily as needed pain and inflammation.  Heating pads were recommended.  She has some leftover Flexeril at home and I advised her to use that at nighttime and okay to use during daytime if she is not at work.  We discussed red flag signs and symptoms and reasons for evaluation in office.  If no significant improvement over the next several days with the after mentioned therapies have asked her to schedule an appointment to be seen in office at which point we should anticipate need for x-rays and possible referral.   Start time: 10:23am End time: 10:36am  Total time spent on patient care (including telephone call/ virtual visit): 22 minutes  Ann Hulen Skains, DO [facility] [phone]

## 2018-10-05 ENCOUNTER — Ambulatory Visit: Payer: BLUE CROSS/BLUE SHIELD | Admitting: Physician Assistant

## 2018-10-05 ENCOUNTER — Encounter: Payer: Self-pay | Admitting: Physician Assistant

## 2018-10-05 ENCOUNTER — Ambulatory Visit (INDEPENDENT_AMBULATORY_CARE_PROVIDER_SITE_OTHER): Payer: BLUE CROSS/BLUE SHIELD | Admitting: Physician Assistant

## 2018-10-05 ENCOUNTER — Other Ambulatory Visit: Payer: Self-pay

## 2018-10-05 VITALS — BP 154/85 | HR 95 | Temp 97.7°F | Ht 63.0 in | Wt 178.0 lb

## 2018-10-05 DIAGNOSIS — M2062 Acquired deformities of toe(s), unspecified, left foot: Secondary | ICD-10-CM | POA: Diagnosis not present

## 2018-10-05 DIAGNOSIS — L600 Ingrowing nail: Secondary | ICD-10-CM

## 2018-10-05 MED ORDER — CEPHALEXIN 500 MG PO CAPS: 500.0000 mg | ORAL_CAPSULE | Freq: Four times a day (QID) | ORAL | 0 refills | Status: DC

## 2018-10-05 NOTE — Patient Instructions (Signed)
Ingrown Toenail  An ingrown toenail occurs when the corner or sides of a toenail grow into the surrounding skin. This causes discomfort and pain. The big toe is most commonly affected, but any of the toes can be affected. If an ingrown toenail is not treated, it can become infected.  What are the causes?  This condition may be caused by:   Wearing shoes that are too small or tight.   An injury, such as stubbing your toe or having your toe stepped on.   Improper cutting or care of your toenails.   Having nail or foot abnormalities that were present from birth (congenital abnormalities), such as having a nail that is too big for your toe.  What increases the risk?  The following factors may make you more likely to develop ingrown toenails:   Age. Nails tend to get thicker with age, so ingrown nails are more common among older people.   Cutting your toenails incorrectly, such as cutting them very short or cutting them unevenly.  An ingrown toenail is more likely to get infected if you have:   Diabetes.   Blood flow (circulation) problems.  What are the signs or symptoms?  Symptoms of an ingrown toenail may include:   Pain, soreness, or tenderness.   Redness.   Swelling.   Hardening of the skin that surrounds the toenail.  Signs that an ingrown toenail may be infected include:   Fluid or pus.   Symptoms that get worse instead of better.  How is this diagnosed?  An ingrown toenail may be diagnosed based on your medical history, your symptoms, and a physical exam. If you have fluid or blood coming from your toenail, a sample may be collected to test for the specific type of bacteria that is causing the infection.  How is this treated?  Treatment depends on how severe your ingrown toenail is. You may be able to care for your toenail at home.   If you have an infection, you may be prescribed antibiotic medicines.   If you have fluid or pus draining from your toenail, your health care provider may drain  it.   If you have trouble walking, you may be given crutches to use.   If you have a severe or infected ingrown toenail, you may need a procedure to remove part or all of the nail.  Follow these instructions at home:  Foot care     Do not pick at your toenail or try to remove it yourself.   Soak your foot in warm, soapy water. Do this for 20 minutes, 3 times a day, or as often as told by your health care provider. This helps to keep your toe clean and keep your skin soft.   Wear shoes that fit well and are not too tight. Your health care provider may recommend that you wear open-toed shoes while you heal.   Trim your toenails regularly and carefully. Cut your toenails straight across to prevent injury to the skin at the corners of the toenail. Do not cut your nails in a curved shape.   Keep your feet clean and dry to help prevent infection.  Medicines   Take over-the-counter and prescription medicines only as told by your health care provider.   If you were prescribed an antibiotic, take it as told by your health care provider. Do not stop taking the antibiotic even if you start to feel better.  Activity   Return to your normal activities   as told by your health care provider. Ask your health care provider what activities are safe for you.   Avoid activities that cause pain.  General instructions   If your health care provider told you to use crutches to help you move around, use them as instructed.   Keep all follow-up visits as told by your health care provider. This is important.  Contact a health care provider if:   You have more redness, swelling, pain, or other symptoms that do not improve with treatment.   You have fluid, blood, or pus coming from your toenail.  Get help right away if:   You have a red streak on your skin that starts at your foot and spreads up your leg.   You have a fever.  Summary   An ingrown toenail occurs when the corner or sides of a toenail grow into the surrounding  skin. This causes discomfort and pain. The big toe is most commonly affected, but any of the toes can be affected.   If an ingrown toenail is not treated, it can become infected.   Fluid or pus draining from your toenail is a sign of infection. Your health care provider may need to drain it. You may be given antibiotics to treat the infection.   Trimming your toenails regularly and properly can help you prevent an ingrown toenail.  This information is not intended to replace advice given to you by your health care provider. Make sure you discuss any questions you have with your health care provider.  Document Released: 04/22/2000 Document Revised: 01/11/2017 Document Reviewed: 01/11/2017  Elsevier Interactive Patient Education  2019 [employer]vier Inc[employer]

## 2018-10-09 DIAGNOSIS — M79675 Pain in left toe(s): Secondary | ICD-10-CM | POA: Diagnosis not present

## 2018-10-09 DIAGNOSIS — L03031 Cellulitis of right toe: Secondary | ICD-10-CM | POA: Diagnosis not present

## 2018-10-10 NOTE — Progress Notes (Signed)
BP (!) 154/85   Pulse 95   Temp 97.7 F (36.5 C) (Oral)   Ht '5\' 3"'  (1.6 m)   Wt 178 lb (80.7 kg)   BMI 31.53 kg/m    Subjective:    Patient ID: Ann Snyder, female    DOB: 1951-03-08, 68 y.o.   MRN: [id number]  HPI: Ann Snyder is a 68 y.o. 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  She has to stand throughout the day.  She has a lot of walking.  She has consistently had increasing pain in the left lateral area of her toenail.  She denies any redness, pus, drainage.  There has been increasing pain and soreness.  She has to get out of her shoes as soon as possible.  Even her soft wide shoes that she wears to work put pressure on it.  She does have a little bit of toe deformity.  She has not been to podiatry in some time.  I Georgina Peer give her an antibiotic for Keflex and make referral per for podiatry to possibly have the toenail removed and to watch any other foot deformity that could be creating this problem.  Past Medical History:  Diagnosis Date  . Anxiety   . Depression   . Essential hypertension 12/20/2016  . Fungal infection    right big toe  . Headache(784.0)   . High cholesterol   . OSA (obstructive sleep apnea) 10/07/2014   Moderate with AHI 20/hr   Relevant past medical, surgical, family and social history reviewed and updated as indicated. Interim medical history since our last visit reviewed. Allergies and medications reviewed and updated. DATA REVIEWED: CHART IN EPIC  Family History reviewed for pertinent findings.  Review of Systems  Constitutional: Negative.  Negative for activity change, fatigue and fever.  HENT: Negative.   Eyes: Negative.   Respiratory: Negative.  Negative for cough.   Cardiovascular: Negative.  Negative for chest pain.  Gastrointestinal: Negative.  Negative for abdominal pain.  Endocrine: Negative.   Genitourinary: Negative.  Negative for dysuria.  Musculoskeletal:  Positive for gait problem and joint swelling.  Skin: Positive for color change.    Allergies as of 10/05/2018   No Known Allergies     Medication List       Accurate as of Oct 05, 2018 11:59 PM. If you have any questions, ask your nurse or doctor.        atorvastatin 20 MG tablet Commonly known as:  LIPITOR TAKE 1 TABLET BY MOUTH EVERY DAY   cephALEXin 500 MG capsule Commonly known as:  KEFLEX Take 1 capsule (500 mg total) by mouth 4 (four) times daily. Started by:  Terald Sleeper, PA-C   cyclobenzaprine 5 MG tablet Commonly known as:  FLEXERIL Take 0.5-1 tablets (2.5-5 mg total) by mouth 3 (three) times daily as needed for muscle spasms.   lisinopril 10 MG tablet Commonly known as:  ZESTRIL Take 1 tablet (10 mg total) by mouth daily.   mirtazapine 30 MG tablet Commonly known as:  REMERON TAKE 1 TABLET (30 MG TOTAL) BY MOUTH AT BEDTIME. (NEEDS TO BE SEEN BEFORE NEXT REFILL)   Vascepa 1 g Caps Generic drug:  Icosapent Ethyl TAKE 2 CAPSULES BY MOUTH 2 (TWO) TIMES DAILY. WITH OR FOLLOWING MEALS          Objective:    BP (!) 154/85   Pulse 95   Temp 97.7 F (  36.5 C) (Oral)   Ht '5\' 3"'  (1.6 m)   Wt 178 lb (80.7 kg)   BMI 31.53 kg/m   No Known Allergies  Wt Readings from Last 3 Encounters:  10/05/18 178 lb (80.7 kg)  05/31/18 175 lb 12.8 oz (79.7 kg)  03/21/18 175 lb 12.8 oz (79.7 kg)    Physical Exam Constitutional:      Appearance: She is well-developed.  HENT:     Head: Normocephalic and atraumatic.  Eyes:     Conjunctiva/sclera: Conjunctivae normal.     Pupils: Pupils are equal, round, and reactive to light.  Cardiovascular:     Rate and Rhythm: Normal rate and regular rhythm.     Heart sounds: Normal heart sounds.  Pulmonary:     Effort: Pulmonary effort is normal.     Breath sounds: Normal breath sounds.  Abdominal:     General: Bowel sounds are normal.     Palpations: Abdomen is soft.  Musculoskeletal:     Left foot: Deformity present.        Feet:  Feet:     Left foot:     Skin integrity: Erythema present.     Toenail Condition: Left toenails are ingrown.  Skin:    General: Skin is warm and dry.     Findings: No rash.  Neurological:     Mental Status: She is alert and oriented to person, place, and time.     Deep Tendon Reflexes: Reflexes are normal and symmetric.  Psychiatric:        Behavior: Behavior normal.        Thought Content: Thought content normal.        Judgment: Judgment normal.     Results for orders placed or performed in visit on 03/26/18  [facility]  Result Value Ref Range   Glucose 97 65 - 99 mg/dL   BUN 15 8 - 27 mg/dL   Creatinine, Ser 0.73 0.57 - 1.00 mg/dL   GFR calc non Af Amer 86 >59 mL/min/1.73   GFR calc Af Amer 99 >59 mL/min/1.73   BUN/Creatinine Ratio 21 12 - 28   Sodium 141 134 - 144 mmol/L   Potassium 4.4 3.5 - 5.2 mmol/L   Chloride 103 96 - 106 mmol/L   CO2 21 20 - 29 mmol/L   Calcium 9.3 8.7 - 10.3 mg/dL  Lipid panel  Result Value Ref Range   Cholesterol, Total 155 100 - 199 mg/dL   Triglycerides 279 (H) 0 - 149 mg/dL   HDL 38 (L) >39 mg/dL   VLDL Cholesterol Cal 56 (H) 5 - 40 mg/dL   LDL Calculated 61 0 - 99 mg/dL   Chol/HDL Ratio 4.1 0.0 - 4.4 ratio      Assessment & Plan:   1. Ingrown toenail of left foot - cephALEXin (KEFLEX) 500 MG capsule; Take 1 capsule (500 mg total) by mouth 4 (four) times daily.  Dispense: 40 capsule; Refill: 0 - Ambulatory referral to Podiatry  2. Deformity of toe of left foot - Ambulatory referral to Podiatry   Continue all other maintenance medications as listed above.  Follow up plan: No follow-ups on file.  Educational handout given for ingrown nail  Terald Sleeper PA-C [facility] [address]  [place], [place] [address] [phone]   10/10/2018, 1:23 PM

## 2018-10-23 DIAGNOSIS — B351 Tinea unguium: Secondary | ICD-10-CM | POA: Diagnosis not present

## 2018-10-23 DIAGNOSIS — L03031 Cellulitis of right toe: Secondary | ICD-10-CM | POA: Diagnosis not present

## 2018-11-02 ENCOUNTER — Other Ambulatory Visit: Payer: Self-pay | Admitting: *Deleted

## 2018-11-02 MED ORDER — VASCEPA 1 G PO CAPS: ORAL_CAPSULE | ORAL | 3 refills | Status: DC

## 2018-11-13 ENCOUNTER — Other Ambulatory Visit: Payer: Self-pay | Admitting: *Deleted

## 2018-11-13 MED ORDER — ATORVASTATIN CALCIUM 20 MG PO TABS: ORAL_TABLET | ORAL | 0 refills | Status: DC

## 2018-12-15 ENCOUNTER — Other Ambulatory Visit: Payer: Self-pay | Admitting: Family

## 2018-12-17 ENCOUNTER — Telehealth: Payer: Self-pay | Admitting: Family Medicine

## 2018-12-17 ENCOUNTER — Other Ambulatory Visit: Payer: Self-pay | Admitting: Family Medicine

## 2018-12-17 MED ORDER — MIRTAZAPINE 30 MG PO TABS: 30.0000 mg | ORAL_TABLET | Freq: Every day | ORAL | 0 refills | Status: DC

## 2018-12-17 NOTE — Telephone Encounter (Signed)
[place]ottschalk. NTBS 30 days given 11/13/18

## 2018-12-17 NOTE — Telephone Encounter (Signed)
done

## 2018-12-17 NOTE — Telephone Encounter (Signed)
Full voice mail on cell phone and not taking calls on home phone. Unable to leave a message asking patient to please schedule an appointment to continue receiving medication refills.

## 2018-12-20 ENCOUNTER — Other Ambulatory Visit: Payer: Self-pay | Admitting: Family Medicine

## 2018-12-20 NOTE — Telephone Encounter (Signed)
OV 01/11/19

## 2019-01-02 ENCOUNTER — Other Ambulatory Visit: Payer: Self-pay | Admitting: *Deleted

## 2019-01-02 DIAGNOSIS — I10 Essential (primary) hypertension: Secondary | ICD-10-CM

## 2019-01-02 MED ORDER — LISINOPRIL 10 MG PO TABS: 10.0000 mg | ORAL_TABLET | Freq: Every day | ORAL | 0 refills | Status: DC

## 2019-01-03 ENCOUNTER — Other Ambulatory Visit: Payer: Self-pay | Admitting: Family Medicine

## 2019-01-10 ENCOUNTER — Other Ambulatory Visit: Payer: Self-pay

## 2019-01-11 ENCOUNTER — Encounter: Payer: Self-pay | Admitting: Family Medicine

## 2019-01-11 ENCOUNTER — Ambulatory Visit: Payer: BC Managed Care – PPO | Admitting: Family Medicine

## 2019-01-11 VITALS — BP 131/78 | HR 92 | Temp 97.7°F | Ht 63.0 in | Wt 171.0 lb

## 2019-01-11 DIAGNOSIS — Z130 Encounter for screening for diseases of the blood and blood-forming organs and certain disorders involving the immune mechanism: Secondary | ICD-10-CM | POA: Diagnosis not present

## 2019-01-11 DIAGNOSIS — F418 Other specified anxiety disorders: Secondary | ICD-10-CM | POA: Diagnosis not present

## 2019-01-11 DIAGNOSIS — I10 Essential (primary) hypertension: Secondary | ICD-10-CM | POA: Diagnosis not present

## 2019-01-11 DIAGNOSIS — E559 Vitamin D deficiency, unspecified: Secondary | ICD-10-CM

## 2019-01-11 DIAGNOSIS — E782 Mixed hyperlipidemia: Secondary | ICD-10-CM | POA: Diagnosis not present

## 2019-01-11 DIAGNOSIS — Z23 Encounter for immunization: Secondary | ICD-10-CM | POA: Diagnosis not present

## 2019-01-11 MED ORDER — LISINOPRIL 10 MG PO TABS: 10.0000 mg | ORAL_TABLET | Freq: Every day | ORAL | 3 refills | Status: DC

## 2019-01-11 MED ORDER — MIRTAZAPINE 30 MG PO TABS: 30.0000 mg | ORAL_TABLET | Freq: Every day | ORAL | 3 refills | Status: DC

## 2019-01-11 NOTE — Patient Instructions (Signed)
I have placed standing orders for fasting labs.  Come in at your convenience prior to your next appointment to have these done.

## 2019-01-11 NOTE — Progress Notes (Signed)
Subjective: CC: Tick bite, vitamin D deficiency, elevated blood sugar PCP: Janora Norlander, DO Ann Snyder is a 68 y.o. female presenting to clinic today for:  1.  Tick bite Patient reports she stained tick bite to the right rib cage.  Her son attempted to pull this out but she worries that there is a head still stuck in there.  Denies any significant rash, pain or swelling.  No fevers.  No myalgia.  2.  Elevated blood sugar/vitamin D deficiency Patient had labs done at her work at [employer].  She notes that she was noted to be vitamin D deficient has since been prescribed replacement.  She is to follow-up soon for recheck.  Additionally, she notes that her blood sugar was elevated and she was asked to monitor her blood sugar by pricking herself daily.  She is reluctant to do this and wanted me to talk to her more about the lab results before proceeding.  3.  Hypertension hyperlipidemia Patient reports compliance with lisinopril 10 mg daily and Lipitor 20 mg daily.  She is also on Vascepa for hypertriglyceridemia.  No chest pain, shortness of breath.  She remains physically active despite occasional aches and pains.  She reports occasional hip pain bilaterally.  However, her ability to walk and do her job are never affected.  The pain is never sustained.   ROS: Per HPI  No Known Allergies Past Medical History:  Diagnosis Date  . Anxiety   . Depression   . Elevated blood pressure reading without diagnosis of hypertension 01/20/2015  . Essential hypertension 12/20/2016  . Fungal infection    right big toe  . Headache(784.0)   . High cholesterol   . OSA (obstructive sleep apnea) 10/07/2014   Moderate with AHI 20/hr    Current Outpatient Medications:  .  atorvastatin (LIPITOR) 20 MG tablet, TAKE 1 TABLET BY MOUTH EVERY DAY AT 6 PM(NEEDS TO BE SEEN BEFORE NEXT REFILL), Disp: 90 tablet, Rfl: 1 .  ergocalciferol (VITAMIN D2) 1.25 MG (50000 UT) capsule, TAKE 1 CAPSULE BY  MOUTH ONE TIME PER WEEK, Disp: , Rfl:  .  Icosapent Ethyl (VASCEPA) 1 g CAPS, TAKE 2 CAPSULES BY MOUTH 2 (TWO) TIMES DAILY. WITH OR FOLLOWING MEALS, Disp: 120 capsule, Rfl: 3 .  lisinopril (ZESTRIL) 10 MG tablet, Take 1 tablet (10 mg total) by mouth daily. (Needs to be seen before next refill), Disp: 30 tablet, Rfl: 0 .  mirtazapine (REMERON) 30 MG tablet, Take 1 tablet (30 mg total) by mouth at bedtime. (Needs to be seen before next refill), Disp: 30 tablet, Rfl: 0 Social History   Socioeconomic History  . Marital status: Widowed    Spouse name: Not on file  . Number of children: Not on file  . Years of education: Not on file  . Highest education level: Not on file  Occupational History  . Not on file  Social Needs  . Financial resource strain: Not on file  . Food insecurity    Worry: Not on file    Inability: Not on file  . Transportation needs    Medical: Not on file    Non-medical: Not on file  Tobacco Use  . Smoking status: Never Smoker  . Smokeless tobacco: Never Used  Substance and Sexual Activity  . Alcohol use: No  . Drug use: No  . Sexual activity: Not on file  Lifestyle  . Physical activity    Days per week: Not on file  Minutes per session: Not on file  . Stress: Not on file  Relationships  . Social Herbalist on phone: Not on file    Gets together: Not on file    Attends religious service: Not on file    Active member of club or organization: Not on file    Attends meetings of clubs or organizations: Not on file    Relationship status: Not on file  . Intimate partner violence    Fear of current or ex partner: Not on file    Emotionally abused: Not on file    Physically abused: Not on file    Forced sexual activity: Not on file  Other Topics Concern  . Not on file  Social History Narrative  . Not on file   Family History  Problem Relation Age of Onset  . Anxiety disorder Father   . Heart disease Father   . Diabetes Mother   . ADD / ADHD  Neg Hx   . Alcohol abuse Neg Hx   . Drug abuse Neg Hx   . Bipolar disorder Neg Hx   . Depression Neg Hx   . Dementia Neg Hx   . OCD Neg Hx   . Paranoid behavior Neg Hx   . Schizophrenia Neg Hx   . Seizures Neg Hx   . Physical abuse Neg Hx   . Sexual abuse Neg Hx     Objective: Office vital signs reviewed. BP 131/78   Pulse 92   Temp 97.7 F (36.5 C) (Oral)   Ht '5\' 3"'  (1.6 m)   Wt 171 lb (77.6 kg)   SpO2 97%   BMI 30.29 kg/m   Physical Examination:  General: Awake, alert, well nourished, No acute distress HEENT: Normal, sclera white, MMM Cardio: regular rate and rhythm, S1S2 heard, no murmurs appreciated Pulm: clear to auscultation bilaterally, no wheezes, rhonchi or rales; normal work of breathing on room air Extremities: warm, well perfused, No edema, cyanosis or clubbing; +1 pulses bilaterally Skin: Postinflammatory hyperpigmentation noted along the right flank.  No target lesions noted Psych: Mood stable, speech normal, affect appropriate, pleasant and interactive Depression screen [facility] 2/9 01/11/2019 10/05/2018 05/31/2018  Decreased Interest 0 0 0  Down, Depressed, Hopeless 0 0 0  PHQ - 2 Score 0 0 0  Altered sleeping 0 - -  Tired, decreased energy 0 - -  Change in appetite 0 - -  Feeling bad or failure about yourself  0 - -  Trouble concentrating 0 - -  Moving slowly or fidgety/restless 0 - -  Suicidal thoughts 0 - -  PHQ-9 Score 0 - -   GAD 7 : Generalized Anxiety Score 01/11/2019  Nervous, Anxious, on Edge 1  Control/stop worrying 1  Worry too much - different things 1  Trouble relaxing 0  Easily annoyed or irritable 0  Afraid - awful might happen 1  Anxiety Difficulty Not difficult at all    Assessment/ Plan: 68 y.o. female   1. Essential hypertension Stable.  Continue current regimen.  I reviewed her labs and her creatinine was stable.  She does have a slight elevation in her ALT and I recommended that we repeat this along with fasting lipid panel at a  future date.  Her records have been sent to the scan center - CMP14+EGFR; Future - lisinopril (ZESTRIL) 10 MG tablet; Take 1 tablet (10 mg total) by mouth daily.  Dispense: 90 tablet; Refill: 3 - CMP14+EGFR  2. Mixed hyperlipidemia  Plan for repeat lipid. - CMP14+EGFR; Future - Lipid panel; Future - TSH; Future - TSH - Lipid panel - CMP14+EGFR  3. Depression with anxiety Stable.  Refill sent  4. Vitamin D deficiency Currently being repleted - VITAMIN D 25 Hydroxy (Vit-D Deficiency, Fractures); Future - VITAMIN D 25 Hydroxy (Vit-D Deficiency, Fractures)  5. Screening for deficiency anemia No evidence of anemia on recent CBC. - CBC; Future - CBC   Orders Placed This Encounter  Procedures  . CMP14+EGFR    Standing Status:   Future    Standing Expiration Date:   01/11/2020  . Lipid panel    Standing Status:   Future    Standing Expiration Date:   01/11/2020  . TSH    Standing Status:   Future    Standing Expiration Date:   01/11/2020  . VITAMIN D 25 Hydroxy (Vit-D Deficiency, Fractures)    Standing Status:   Future    Standing Expiration Date:   01/11/2020  . CBC    Standing Status:   Future    Standing Expiration Date:   01/11/2020   No orders of the defined types were placed in this encounter.    Janora Norlander, DO [place] [phone]

## 2019-01-12 LAB — CMP14+EGFR
ALT: 37 IU/L — ABNORMAL HIGH (ref 0–32)
AST: 29 IU/L (ref 0–40)
Albumin/Globulin Ratio: 1.6 (ref 1.2–2.2)
Albumin: 4.6 g/dL (ref 3.8–4.8)
Alkaline Phosphatase: 117 IU/L (ref 39–117)
BUN/Creatinine Ratio: 20 (ref 12–28)
BUN: 17 mg/dL (ref 8–27)
Bilirubin Total: <0.2 mg/dL (ref 0.0–1.2)
CO2: 25 mmol/L (ref 20–29)
Calcium: 10 mg/dL (ref 8.7–10.3)
Chloride: 105 mmol/L (ref 96–106)
Creatinine, Ser: 0.85 mg/dL (ref 0.57–1.00)
GFR calc Af Amer: 83 mL/min/{1.73_m2} (ref >59)
GFR calc non Af Amer: 72 mL/min/{1.73_m2} (ref >59)
Globulin, Total: 2.8 g/dL (ref 1.5–4.5)
Glucose: 144 mg/dL — ABNORMAL HIGH (ref 65–99)
Potassium: 4.3 mmol/L (ref 3.5–5.2)
Sodium: 143 mmol/L (ref 134–144)
Total Protein: 7.4 g/dL (ref 6.0–8.5)

## 2019-01-12 LAB — CBC
Hematocrit: 38.5 % (ref 34.0–46.6)
Hemoglobin: 12.6 g/dL (ref 11.1–15.9)
MCH: 28.3 pg (ref 26.6–33.0)
MCHC: 32.7 g/dL (ref 31.5–35.7)
MCV: 86 fL (ref 79–97)
Platelets: 216 10*3/uL (ref 150–450)
RBC: 4.46 x10E6/uL (ref 3.77–5.28)
RDW: 13.1 % (ref 11.7–15.4)
WBC: 7.4 10*3/uL (ref 3.4–10.8)

## 2019-01-12 LAB — LIPID PANEL
Chol/HDL Ratio: 5.4 ratio — ABNORMAL HIGH (ref 0.0–4.4)
Cholesterol, Total: 182 mg/dL (ref 100–199)
HDL: 34 mg/dL — ABNORMAL LOW (ref >39)
LDL Chol Calc (NIH): 73 mg/dL (ref 0–99)
Triglycerides: 479 mg/dL — ABNORMAL HIGH (ref 0–149)
VLDL Cholesterol Cal: 75 mg/dL — ABNORMAL HIGH (ref 5–40)

## 2019-01-12 LAB — TSH: TSH: 1.16 u[IU]/mL (ref 0.450–4.500)

## 2019-01-12 LAB — VITAMIN D 25 HYDROXY (VIT D DEFICIENCY, FRACTURES): Vit D, 25-Hydroxy: 28.3 ng/mL — ABNORMAL LOW (ref 30.0–100.0)

## 2019-01-16 ENCOUNTER — Telehealth: Payer: Self-pay | Admitting: Family Medicine

## 2019-01-16 NOTE — Telephone Encounter (Signed)
Aware of lab results  

## 2019-01-23 DIAGNOSIS — Z1231 Encounter for screening mammogram for malignant neoplasm of breast: Secondary | ICD-10-CM | POA: Diagnosis not present

## 2019-01-23 LAB — HM MAMMOGRAPHY

## 2019-05-15 ENCOUNTER — Ambulatory Visit (INDEPENDENT_AMBULATORY_CARE_PROVIDER_SITE_OTHER): Payer: BC Managed Care – PPO | Admitting: Family Medicine

## 2019-05-15 DIAGNOSIS — F428 Other obsessive-compulsive disorder: Secondary | ICD-10-CM

## 2019-05-15 DIAGNOSIS — R45850 Homicidal ideations: Secondary | ICD-10-CM

## 2019-05-15 NOTE — Patient Instructions (Signed)
If your symptoms worsen or you have thoughts of suicide/homicide, PLEASE SEEK IMMEDIATE MEDICAL ATTENTION.  You may always call:  National Suicide Hotline: 800-273-8255 [place] Crisis Line: 336-832-9700 Crisis Recovery in [place]: 800-939-5911  These are available 24 hours a day, 7 days a week.  

## 2019-05-15 NOTE — Progress Notes (Addendum)
Telephone visit  Subjective: CC: mood PCP: Raliegh Ip, DO WEX:[id number] Ann Snyder is a 69 y.o. female calls for telephone consult today. Patient provides verbal consent for consult held via phone.  Due to COVID-19 pandemic this visit was conducted virtually. This visit type was conducted due to national recommendations for restrictions regarding the COVID-19 Pandemic (e.g. social distancing, sheltering in place) in an effort to limit this patient's exposure and mitigate transmission in our community. All issues noted in this document were discussed and addressed.  A physical exam was not performed with this format.   Location of patient: home Location of provider: [facility] Others present for call: none  1. Obsessive thoughts Patient reports that over the holiday she was watching a movie where a husband poisoned his wife.  She has since not been able to stop thinking about these events and in fact admits that she  fantasizes about poisoning people. She notes that these thoughts scare her.   She has no plans for harm to self or harm to others.  She goes on to state that she is a [place] and loves her family very much and would never harm anybody or herself but she cannot stop these thoughts from occurring over and over.  She denies OCD behaviors.  Denies depressive symptoms.  She reports compliance with Mirtazapine.   She was afraid to reach out to anyone because she did not want people to think that she was crazy or want to harm someone.  But she recognizes that these thoughts are not healthy.   ROS: Per HPI  No Known Allergies Past Medical History:  Diagnosis Date  . Anxiety   . Depression   . Elevated blood pressure reading without diagnosis of hypertension 01/20/2015  . Essential hypertension 12/20/2016  . Fungal infection    right big toe  . Headache(784.0)   . High cholesterol   . OSA (obstructive sleep apnea) 10/07/2014   Moderate with AHI 20/hr    Current Outpatient  Medications:  .  atorvastatin (LIPITOR) 20 MG tablet, TAKE 1 TABLET BY MOUTH EVERY DAY AT 6 PM(NEEDS TO BE SEEN BEFORE NEXT REFILL), Disp: 90 tablet, Rfl: 1 .  ergocalciferol (VITAMIN D2) 1.25 MG (50000 UT) capsule, TAKE 1 CAPSULE BY MOUTH ONE TIME PER WEEK, Disp: , Rfl:  .  Icosapent Ethyl (VASCEPA) 1 g CAPS, TAKE 2 CAPSULES BY MOUTH 2 (TWO) TIMES DAILY. WITH OR FOLLOWING MEALS, Disp: 120 capsule, Rfl: 3 .  lisinopril (ZESTRIL) 10 MG tablet, Take 1 tablet (10 mg total) by mouth daily., Disp: 90 tablet, Rfl: 3 .  mirtazapine (REMERON) 30 MG tablet, Take 1 tablet (30 mg total) by mouth at bedtime., Disp: 90 tablet, Rfl: 3  Assessment/ Plan: 69 y.o. female   1. Psychogenic rumination Patient has no plan for harm to herself or others.  However, she is having some concerning thoughts and therefore I have reached out to virtual behavioral health for assistance with this patient.  She is amenable to this phone call in counseling.  She made a Engineer, manufacturing for safety.  She is to continue the mirtazapine at 30 mg nightly.  We will see if behavioral health specialist recommend any adjustment or change to medications.  She knows to seek immediate medical attention if symptoms worsen.  Otherwise, anticipate that she will be contacted in next 24 hours.  2. Obsessive thoughts of harming others As above.   Start time: 5:22pm End time: 5:40pm  Total time spent on patient care (  including telephone call/ virtual visit): 25 minutes  [place], [place] [phone]  This note is not being shared with the patient for the following reason: To respect privacy (The patient or proxy has requested that the information not be shared).

## 2019-05-20 ENCOUNTER — Telehealth: Payer: Self-pay | Admitting: Family Medicine

## 2019-05-20 NOTE — Telephone Encounter (Signed)
REFERRAL REQUEST Telephone Note  What type of referral do you need? Counseling & other?  Have you been seen at our office for this problem? Yes (Advise that they will likely need an appointment with their PCP before a referral can be done)  Is there a particular doctor or location that you prefer? No  Patient notified that referrals can take up to a week or longer to process. If they haven't heard anything within a week they should call back and speak with the referral department.

## 2019-05-21 ENCOUNTER — Other Ambulatory Visit: Payer: Self-pay | Admitting: Family Medicine

## 2019-05-21 DIAGNOSIS — R45850 Homicidal ideations: Secondary | ICD-10-CM

## 2019-05-21 DIAGNOSIS — F428 Other obsessive-compulsive disorder: Secondary | ICD-10-CM

## 2019-05-21 NOTE — Progress Notes (Signed)
A message to behavioral health had already been sent but I do not see where she has been reached out to.  I will place a formal referral to psychiatry given ongoing symptoms.

## 2019-05-21 NOTE — Telephone Encounter (Signed)
Urgent referral placed.  Please have patient contact me if she does not hear by the end of the week.

## 2019-05-22 NOTE — Telephone Encounter (Signed)
lmtcb

## 2019-05-24 ENCOUNTER — Telehealth: Payer: Self-pay | Admitting: Family Medicine

## 2019-05-24 NOTE — Telephone Encounter (Signed)
Left message for patient to call . (home number)

## 2019-05-24 NOTE — Telephone Encounter (Signed)
This has been sent several times.  Let me cc to courtney and Jasmine to see if they can look into this for me.  Sang Blount M. Nadine Counts, DO [facility]

## 2019-05-27 NOTE — Telephone Encounter (Signed)
-----   Message from Raliegh Ip, DO sent at 05/24/2019  4:54 PM EST ----- Can we also please give her the list of counselors in [place]?

## 2019-05-27 NOTE — Telephone Encounter (Signed)
List placed at front desk for patient to pick up

## 2019-05-30 ENCOUNTER — Encounter: Payer: Self-pay | Admitting: Psychiatry

## 2019-05-30 ENCOUNTER — Other Ambulatory Visit: Payer: Self-pay

## 2019-05-30 ENCOUNTER — Ambulatory Visit (INDEPENDENT_AMBULATORY_CARE_PROVIDER_SITE_OTHER): Payer: BC Managed Care – PPO | Admitting: Psychiatry

## 2019-05-30 DIAGNOSIS — F3342 Major depressive disorder, recurrent, in full remission: Secondary | ICD-10-CM

## 2019-05-30 DIAGNOSIS — F428 Other obsessive-compulsive disorder: Secondary | ICD-10-CM | POA: Diagnosis not present

## 2019-05-30 NOTE — Progress Notes (Signed)
Psychiatric Initial Adult Assessment   I connected with  Ann Snyder on 05/30/19 by a video enabled telemedicine application and verified that I am speaking with the correct person using two identifiers.   I discussed the limitations of evaluation and management by telemedicine. The patient expressed understanding and agreed to proceed.    Patient Identification: Ann Snyder MRN:  [id number] Date of Evaluation:  05/30/2019   Referral Source: PCP, Dr. Nadine Counts  Chief Complaint:   " I am actually doing better now."  Visit Diagnosis:    ICD-10-CM   1. Obsessive thinking- partially resolved  F42.8   2. MDD (major depressive disorder), recurrent, in full remission (HCC)  F33.42     History of Present Illness: This is a 69 year old female with history of major depressive disorder currently being managed on mirtazapine 30 mg now seen for psychiatric evaluation.  The referral was made by her primary care physician after patient reported recurring thoughts of something that she saw a movie around the New Year's Eve.  Patient reported that around [place] Eve she saw an old murder mystery movie in which she saw a man murdering his wife by poisoning her food.  Patient reported that ever since she saw the movie scene and she kept thinking about it repeatedly.  She began to have urges to do the same to someone else's food.  She tried to stop thinking about it but could not stop herself from doing so.  She denied any urges to hurt herself or poison herself.  She did not report any changes in her sleep patterns. Of note patient tested positive for COVID-19 on January 14 at her workplace.  Patient was asked if these symptoms started a few days prior to her testing positive.  She informed that the obsessive ideations have been ongoing since around January 1 and she did not test positive on the Covid test previously done on January 11.  Patient does not think COVID-19 infection has any  association with her obsessive ideations. Patient reported that ever since she tested positive she has been at home and isolating herself and since then she has felt relief and the obsessive thoughts have improved remarkably.  Regarding her COVID-19 infection symptoms, she reported that she only had runny nose for a few days around the time she was tested and denied any other symptoms suggestive of delirium.  Patient has been maintained on mirtazapine since November 2018.  Patient reported that the medication has helped her symptoms of depression and insomnia. She denied anhedonia, feelings of helplessness, low energy levels, poor concentration or poor sleep.  She denied any suicidal or homicidal ideations.  She denied any symptom suggestive of hypomania or mania.  She denied any symptoms of psychosis like hallucinations or paranoid delusions.  She denied any symptoms suggestive of OCD like frequent checking of door locks, gas knobs etc.  She denied frequent handwashing or cleaning rituals.  She currently works as a Production assistant, radio in a retirement residential facility.  She undergoes Covid testing at work 2 times per week.  She has 3 sons, 2 of her younger sons live with her.  Past Psychiatric History: Her past psychiatric history significant for severe episode of depression about 10 years ago when she had to be hospitalized at [facility].  She was managed by outpatient psychiatrist Dr. Lolly Mustache for a few years following that.  She has been on several different antidepressants in the past in addition to risperidone.  She reported  history of complicated grief after losing her husband about 20 years ago followed by deaths of her parents and her mother-in-law.  Previous Psychotropic Medications: Yes -Sertraline, bupropion, risperidone, clonazepam  Substance Abuse History in the last 12 months:  No.  Consequences of Substance Abuse: Negative  Past Medical History:  Past Medical History:  Diagnosis Date  .  Anxiety   . Depression   . Elevated blood pressure reading without diagnosis of hypertension 01/20/2015  . Essential hypertension 12/20/2016  . Fungal infection    right big toe  . Headache(784.0)   . High cholesterol   . OSA (obstructive sleep apnea) 10/07/2014   Moderate with AHI 20/hr    Past Surgical History:  Procedure Laterality Date  . CESAREAN SECTION     3 times    Family Psychiatric History: father- anxiety  Family History:  Family History  Problem Relation Age of Onset  . Anxiety disorder Father   . Heart disease Father   . Diabetes Mother   . ADD / ADHD Neg Hx   . Alcohol abuse Neg Hx   . Drug abuse Neg Hx   . Bipolar disorder Neg Hx   . Depression Neg Hx   . Dementia Neg Hx   . OCD Neg Hx   . Paranoid behavior Neg Hx   . Schizophrenia Neg Hx   . Seizures Neg Hx   . Physical abuse Neg Hx   . Sexual abuse Neg Hx     Social History:   Social History   Socioeconomic History  . Marital status: Widowed    Spouse name: Not on file  . Number of children: Not on file  . Years of education: Not on file  . Highest education level: Not on file  Occupational History  . Not on file  Tobacco Use  . Smoking status: Never Smoker  . Smokeless tobacco: Never Used  Substance and Sexual Activity  . Alcohol use: No  . Drug use: No  . Sexual activity: Not on file  Other Topics Concern  . Not on file  Social History Narrative  . Not on file   Social Determinants of Health   Financial Resource Strain:   . Difficulty of Paying Living Expenses: Not on file  Food Insecurity:   . Worried About Programme researcher, broadcasting/film/video in the Last Year: Not on file  . Ran Out of Food in the Last Year: Not on file  Transportation Needs:   . Lack of Transportation (Medical): Not on file  . Lack of Transportation (Non-Medical): Not on file  Physical Activity:   . Days of Exercise per Week: Not on file  . Minutes of Exercise per Session: Not on file  Stress:   . Feeling of Stress : Not  on file  Social Connections:   . Frequency of Communication with Friends and Family: Not on file  . Frequency of Social Gatherings with Friends and Family: Not on file  . Attends Religious Services: Not on file  . Active Member of Clubs or Organizations: Not on file  . Attends Banker Meetings: Not on file  . Marital Status: Not on file    Additional Social History: She currently works as a Production assistant, radio in a retirement residential facility.  She undergoes Covid testing at work 2 times per week.  She has 3 sons, 2 of her younger sons live with her.   Allergies:  No Known Allergies  Metabolic Disorder Labs: Lab Results  Component Value  Date   HGBA1C 5.3% 03/14/2013   MPG 108 11/08/2011   No results found for: PROLACTIN Lab Results  Component Value Date   CHOL 182 01/11/2019   TRIG 479 (H) 01/11/2019   HDL 34 (L) 01/11/2019   CHOLHDL 5.4 (H) 01/11/2019   VLDL 49 (H) 09/13/2012   LDLCALC 73 01/11/2019   LDLCALC 61 03/26/2018   Lab Results  Component Value Date   TSH 1.160 01/11/2019    Therapeutic Level Labs: No results found for: LITHIUM No results found for: CBMZ No results found for: VALPROATE  Current Medications: Current Outpatient Medications  Medication Sig Dispense Refill  . atorvastatin (LIPITOR) 20 MG tablet TAKE 1 TABLET BY MOUTH EVERY DAY AT 6 PM(NEEDS TO BE SEEN BEFORE NEXT REFILL) 90 tablet 1  . ergocalciferol (VITAMIN D2) 1.25 MG (50000 UT) capsule TAKE 1 CAPSULE BY MOUTH ONE TIME PER WEEK    . Icosapent Ethyl (VASCEPA) 1 g CAPS TAKE 2 CAPSULES BY MOUTH 2 (TWO) TIMES DAILY. WITH OR FOLLOWING MEALS 120 capsule 3  . lisinopril (ZESTRIL) 10 MG tablet Take 1 tablet (10 mg total) by mouth daily. 90 tablet 3  . mirtazapine (REMERON) 30 MG tablet Take 1 tablet (30 mg total) by mouth at bedtime. 90 tablet 3   No current facility-administered medications for this visit.    Musculoskeletal: Strength & Muscle Tone: unable to assess due to telemed  visit Gait & Station: unable to assess due to telemed visit Patient leans: unable to assess due to telemed visit   Psychiatric Specialty Exam: Review of Systems  There were no vitals taken for this visit.There is no height or weight on file to calculate BMI.  General Appearance: Fairly Groomed  Eye Contact:  Good  Speech:  Clear and Coherent and Normal Rate  Volume:  Normal  Mood:  Euthymic  Affect:  Congruent  Thought Process:  Goal Directed, Linear and Descriptions of Associations: Intact  Orientation:  Full (Time, Place, and Person)  Thought Content:  Logical  Suicidal Thoughts:  No  Homicidal Thoughts:  No  Memory:  Immediate;   Good Recent;   Good  Judgement:  Fair  Insight:  Fair  Psychomotor Activity:  Normal  Concentration:  Concentration: Good and Attention Span: Good  Recall:  Good  Fund of Knowledge:Good  Language: Good  Akathisia:  Negative  Handed:  Right  AIMS (if indicated):  Not done  Assets:  Communication Skills Desire for Improvement Financial Resources/Insurance Housing  ADL's:  Intact  Cognition: WNL  Sleep:  Good   Screenings: PHQ2-9     Office Visit from 01/11/2019 in [place] Visit from 10/05/2018 in [place] Visit from 05/31/2018 in [place] Visit from 03/21/2018 in [place] Visit from 11/10/2017 in [place]  PHQ-2 Total Score  0  0  0  0  0  PHQ-9 Total Score  0  --  --  --  --      Assessment and Plan: 69 year old female with past history of MDD now seen for evaluation of ego-dystonic obsessive thoughts of poisoning someone after watching a movie about 3 weeks ago.  These obsessive thoughts are gradually improving with time.  She also tested positive for Covid last week however her symptoms do not seem to be correlate to the infection. She has been managed on mirtazapine 30 mg for over 2 years  now.  She has a stable  job and enjoys her work.  1. Obsessive thinking- partially resolved 2. MDD, recurrent, in remission -Recommend continuing mirtazapine 30 mg at bedtime. Since her thoughts are improving gradually no immediate intervention required at this time. Patient was recommended to continue follow-up with her PCP and to return back to psychiatry if symptoms worsen or fail to improve.  F/up with psychiatry as needed.    Zena Amos, MD 1/21/20211:44 PM

## 2019-05-31 NOTE — Telephone Encounter (Signed)
Reviewed the message that patient will need list of counselors in [place].  Will request that Nolon Rod, Virtual Behavioral Health Clinician, will contact patient to provide resources for mental health in [place].    Joni Reining can also provide brief assessment if needed.  Thank you.  Ernest Haber Lead Behavioral Health Clinician

## 2019-05-31 NOTE — Telephone Encounter (Signed)
Phone call taken care of in different encounter.  This encounter will now be closed  

## 2019-06-03 ENCOUNTER — Ambulatory Visit (HOSPITAL_COMMUNITY): Payer: BC Managed Care – PPO | Admitting: Psychiatry

## 2019-06-06 ENCOUNTER — Telehealth (INDEPENDENT_AMBULATORY_CARE_PROVIDER_SITE_OTHER): Payer: BC Managed Care – PPO | Admitting: Licensed Clinical Social Worker

## 2019-06-06 DIAGNOSIS — F418 Other specified anxiety disorders: Secondary | ICD-10-CM

## 2019-06-06 NOTE — Telephone Encounter (Signed)
Unsuccessful attempt to contact Patient; unable to leave voicemail (voicemail full).  WIll contact again on 06/07/2019

## 2019-07-05 ENCOUNTER — Other Ambulatory Visit: Payer: Self-pay | Admitting: Family Medicine

## 2019-09-15 ENCOUNTER — Other Ambulatory Visit: Payer: Self-pay | Admitting: Family Medicine

## 2019-09-23 ENCOUNTER — Other Ambulatory Visit (HOSPITAL_BASED_OUTPATIENT_CLINIC_OR_DEPARTMENT_OTHER): Payer: Self-pay

## 2019-09-23 DIAGNOSIS — R5383 Other fatigue: Secondary | ICD-10-CM

## 2019-09-23 DIAGNOSIS — R0683 Snoring: Secondary | ICD-10-CM

## 2019-10-15 ENCOUNTER — Other Ambulatory Visit: Payer: Self-pay | Admitting: Family Medicine

## 2019-10-15 NOTE — Telephone Encounter (Signed)
[place]. NTBS 30 days given 09/16/19

## 2019-10-15 NOTE — Telephone Encounter (Signed)
Attempted to contact patient - NVM 

## 2019-12-02 ENCOUNTER — Other Ambulatory Visit: Payer: Self-pay | Admitting: Family Medicine

## 2019-12-02 ENCOUNTER — Ambulatory Visit (HOSPITAL_BASED_OUTPATIENT_CLINIC_OR_DEPARTMENT_OTHER): Payer: BC Managed Care – PPO | Attending: Nurse Practitioner | Admitting: Internal Medicine

## 2019-12-02 ENCOUNTER — Other Ambulatory Visit: Payer: Self-pay

## 2019-12-02 VITALS — Ht 61.0 in | Wt 171.0 lb

## 2019-12-02 DIAGNOSIS — R0683 Snoring: Secondary | ICD-10-CM | POA: Diagnosis not present

## 2019-12-02 DIAGNOSIS — G4733 Obstructive sleep apnea (adult) (pediatric): Secondary | ICD-10-CM | POA: Diagnosis not present

## 2019-12-02 DIAGNOSIS — R5383 Other fatigue: Secondary | ICD-10-CM

## 2019-12-03 NOTE — Telephone Encounter (Signed)
[place]. NTBS 30 days given 09/16/19 LOV 01/11/19

## 2019-12-04 NOTE — Telephone Encounter (Signed)
Called patient, no answer, left message to return call 

## 2019-12-07 DIAGNOSIS — R0683 Snoring: Secondary | ICD-10-CM | POA: Diagnosis not present

## 2019-12-07 NOTE — Procedures (Signed)
    Patient Name: Ann Snyder, Ann Snyder Date: 12/03/2019 Gender: Female D.O.B: 10-04-1950 Age (years): 31 Referring Provider: Maximino Sarin NP Height (inches): 6 Interpreting Physician: Jetty Duhamel MD, ABSM Weight (lbs): 16 RPSGT: [place] BMI: 312 MRN: [id number] Neck Size: 15.00  CLINICAL INFORMATION Sleep Study Type: HST Indication for sleep study: Fatigue, Snoring Epworth Sleepiness Score: 2  Most recent titration study dated 11/21/2014 was optimal at 14cm H2O with an AHI of 9.0/h.  SLEEP STUDY TECHNIQUE A multi-channel overnight portable sleep study was performed. The channels recorded were: nasal airflow, thoracic respiratory movement, and oxygen saturation with a pulse oximetry. Snoring was also monitored.  MEDICATIONS Patient self administered medications include: none reported.  SLEEP ARCHITECTURE Patient was studied for 412.9 minutes. The sleep efficiency was 100.0 % and the patient was supine for 23.6%. The arousal index was 0.0 per hour.  RESPIRATORY PARAMETERS The overall AHI was 28.8 per hour, with a central apnea index of 0.0 per hour. The oxygen nadir was 74% during sleep.  CARDIAC DATA Mean heart rate during sleep was 75.8 bpm.  IMPRESSIONS - Moderate obstructive sleep apnea occurred during this study (AHI = 28.8/h). - No significant central sleep apnea occurred during this study (CAI = 0.0/h). - Oxygen desaturation was noted during this study (Min O2 = 74%).Mean sat 91%. - Patient snored.  DIAGNOSIS - Obstructive Sleep Apnea (G47.33)  RECOMMENDATIONS - Suggest CPAP titration sleep study or autopap. Other options would be based on clinical judgment. - Be careful with alcohol, sedatives and other CNS depressants that may worsen sleep apnea and disrupt normal sleep architecture. - Sleep hygiene should be reviewed to assess factors that may improve sleep quality. - Weight management and regular exercise should be initiated or  continued.  [Electronically signed] 12/07/2019 10:08 AM  Jetty Duhamel MD, ABSM Diplomate, American Board of Sleep Medicine   NPI: [id number]                         Jetty Duhamel Diplomate, American Board of Sleep Medicine  ELECTRONICALLY SIGNED ON:  12/07/2019, 10:01 AM [facility] PH: [phone]   FX: [phone] ACCREDITED BY THE AMERICAN ACADEMY OF SLEEP MEDICINE

## 2019-12-20 ENCOUNTER — Ambulatory Visit: Payer: Medicare Other | Admitting: Nurse Practitioner

## 2020-01-06 ENCOUNTER — Other Ambulatory Visit: Payer: Self-pay

## 2020-01-06 ENCOUNTER — Encounter: Payer: Self-pay | Admitting: Family Medicine

## 2020-01-06 ENCOUNTER — Ambulatory Visit (INDEPENDENT_AMBULATORY_CARE_PROVIDER_SITE_OTHER): Payer: BC Managed Care – PPO | Admitting: Family Medicine

## 2020-01-06 VITALS — BP 143/80 | HR 90 | Temp 97.2°F | Ht 61.0 in | Wt 165.8 lb

## 2020-01-06 DIAGNOSIS — E559 Vitamin D deficiency, unspecified: Secondary | ICD-10-CM

## 2020-01-06 DIAGNOSIS — I10 Essential (primary) hypertension: Secondary | ICD-10-CM | POA: Diagnosis not present

## 2020-01-06 DIAGNOSIS — G4733 Obstructive sleep apnea (adult) (pediatric): Secondary | ICD-10-CM | POA: Diagnosis not present

## 2020-01-06 DIAGNOSIS — E782 Mixed hyperlipidemia: Secondary | ICD-10-CM

## 2020-01-06 DIAGNOSIS — B359 Dermatophytosis, unspecified: Secondary | ICD-10-CM

## 2020-01-06 MED ORDER — LISINOPRIL 10 MG PO TABS: 10.0000 mg | ORAL_TABLET | Freq: Every day | ORAL | 3 refills | Status: DC

## 2020-01-06 MED ORDER — MIRTAZAPINE 30 MG PO TABS: 30.0000 mg | ORAL_TABLET | Freq: Every day | ORAL | 3 refills | Status: DC

## 2020-01-06 MED ORDER — ATORVASTATIN CALCIUM 20 MG PO TABS: ORAL_TABLET | ORAL | 3 refills | Status: DC

## 2020-01-06 MED ORDER — ICOSAPENT ETHYL 1 G PO CAPS: ORAL_CAPSULE | ORAL | 12 refills | Status: DC

## 2020-01-06 NOTE — Patient Instructions (Signed)
Come in for fasting labs at your convenience.   

## 2020-01-06 NOTE — Progress Notes (Signed)
Subjective: CC: Follow-up hyperlipidemia, breast lesion PCP: Janora Norlander, DO XIH:WTUUEKC Ann Snyder is a 69 y.o. female presenting to clinic today for:  1.  Hypertension with hyperlipidemia Patient reports compliance with lisinopril 10 mg daily, Lipitor 20 mg daily but has been out of her Vascepa for a while now.  No chest pain, shortness of breath, lower extremity edema.  2.  Breast lesion Patient reports a right breast lesion that she had evaluated by the nurse at work.  She was started on topical ketoconazole but encouraged to follow-up with me for recheck.  Lesion does seem to be getting lighter and flatter.  Denies any diffuse itching.  She has been on the ketoconazole for about a week now.  3.  Obstructive sleep apnea Patient recently had sleep study and was determined to have moderate obstructive sleep apnea.  Recommendations were AutoPap versus CPAP titration study.  No CPAP was arranged for the patient unfortunately but she is very interested in getting this done.  She does report loud snoring.   ROS: Per HPI  No Known Allergies Past Medical History:  Diagnosis Date  . Anxiety   . Depression   . Elevated blood pressure reading without diagnosis of hypertension 01/20/2015  . Essential hypertension 12/20/2016  . Fungal infection    right big toe  . Headache(784.0)   . High cholesterol   . OSA (obstructive sleep apnea) 10/07/2014   Moderate with AHI 20/hr    Current Outpatient Medications:  .  atorvastatin (LIPITOR) 20 MG tablet, TAKE 1 TABLET BY MOUTH EVERY DAY AT 6 PM(NEEDS TO BE SEEN BEFORE NEXT REFILL), Disp: 90 tablet, Rfl: 1 .  buPROPion (WELLBUTRIN SR) 150 MG 12 hr tablet, Take 150 mg by mouth daily., Disp: , Rfl:  .  ketoconazole (NIZORAL) 2 % cream, Apply 1 application topically daily., Disp: , Rfl:  .  lisinopril (ZESTRIL) 10 MG tablet, Take 1 tablet (10 mg total) by mouth daily., Disp: 90 tablet, Rfl: 3 .  mirtazapine (REMERON) 30 MG tablet, Take 1  tablet (30 mg total) by mouth at bedtime., Disp: 90 tablet, Rfl: 3 .  ergocalciferol (VITAMIN D2) 1.25 MG (50000 UT) capsule, TAKE 1 CAPSULE BY MOUTH ONE TIME PER WEEK (Patient not taking: Reported on 01/06/2020), Disp: , Rfl:  .  icosapent Ethyl (VASCEPA) 1 g capsule, TAKE 2 CAPSULES BY MOUTH 2 (TWO) TIMES DAILY. WITH OR FOLLOWING MEALS (Needs to be seen before next refill) (Patient not taking: Reported on 01/06/2020), Disp: 120 capsule, Rfl: 0 Social History   Socioeconomic History  . Marital status: Widowed    Spouse name: Not on file  . Number of children: Not on file  . Years of education: Not on file  . Highest education level: Not on file  Occupational History  . Not on file  Tobacco Use  . Smoking status: Never Smoker  . Smokeless tobacco: Never Used  Vaping Use  . Vaping Use: Never used  Substance and Sexual Activity  . Alcohol use: No  . Drug use: No  . Sexual activity: Not on file  Other Topics Concern  . Not on file  Social History Narrative  . Not on file   Social Determinants of Health   Financial Resource Strain:   . Difficulty of Paying Living Expenses: Not on file  Food Insecurity:   . Worried About Charity fundraiser in the Last Year: Not on file  . Ran Out of Food in the Last Year: Not on  file  Transportation Needs:   . Film/video editor (Medical): Not on file  . Lack of Transportation (Non-Medical): Not on file  Physical Activity:   . Days of Exercise per Week: Not on file  . Minutes of Exercise per Session: Not on file  Stress:   . Feeling of Stress : Not on file  Social Connections:   . Frequency of Communication with Friends and Family: Not on file  . Frequency of Social Gatherings with Friends and Family: Not on file  . Attends Religious Services: Not on file  . Active Member of Clubs or Organizations: Not on file  . Attends Archivist Meetings: Not on file  . Marital Status: Not on file  Intimate Partner Violence:   . Fear of  Current or Ex-Partner: Not on file  . Emotionally Abused: Not on file  . Physically Abused: Not on file  . Sexually Abused: Not on file   Family History  Problem Relation Age of Onset  . Anxiety disorder Father   . Heart disease Father   . Diabetes Mother   . ADD / ADHD Neg Hx   . Alcohol abuse Neg Hx   . Drug abuse Neg Hx   . Bipolar disorder Neg Hx   . Depression Neg Hx   . Dementia Neg Hx   . OCD Neg Hx   . Paranoid behavior Neg Hx   . Schizophrenia Neg Hx   . Seizures Neg Hx   . Physical abuse Neg Hx   . Sexual abuse Neg Hx     Objective: Office vital signs reviewed. BP (!) 143/80   Pulse 90   Temp (!) 97.2 F (36.2 C)   Ht 5' 1" (1.549 m)   Wt 165 lb 12.8 oz (75.2 kg)   SpO2 96%   BMI 31.33 kg/m   Physical Examination:  General: Awake, alert, well nourished, No acute distress HEENT: Normal.  Sclera white.  Moist mucous membranes Breast: Right breast with small patch with irregularly raised borders along the medial aspect of the areola Cardio: regular rate and rhythm, S1S2 heard, no murmurs appreciated Pulm: clear to auscultation bilaterally, no wheezes, rhonchi or rales; normal work of breathing on room air Extremities: Warm, well perfused, no edema  Assessment/ Plan: 69 y.o. female   1. Essential hypertension Controlled at home.  Continue current regimen - lisinopril (ZESTRIL) 10 MG tablet; Take 1 tablet (10 mg total) by mouth daily.  Dispense: 90 tablet; Refill: 3 - CMP14+EGFR; Future  2. Mixed hyperlipidemia She will come in for fasting lab - TSH; Future - CMP14+EGFR; Future - Lipid panel; Future  3. Vitamin D deficiency VITAMIN D 25 Hydroxy (Vit-D Deficiency, Fractures); Future  4. Moderate obstructive sleep apnea We will set her up for AutoPap - For home use only DME continuous positive airway pressure (CPAP)  5. Tinea Agree with ketoconazole.  We discussed return precautions.   No orders of the defined types were placed in this  encounter.  No orders of the defined types were placed in this encounter.    Janora Norlander, DO [place] [phone]

## 2020-01-14 ENCOUNTER — Telehealth: Payer: Self-pay | Admitting: Family Medicine

## 2020-01-14 ENCOUNTER — Other Ambulatory Visit: Payer: Medicare Other

## 2020-01-14 ENCOUNTER — Other Ambulatory Visit: Payer: Self-pay

## 2020-01-14 DIAGNOSIS — E559 Vitamin D deficiency, unspecified: Secondary | ICD-10-CM | POA: Diagnosis not present

## 2020-01-14 DIAGNOSIS — I10 Essential (primary) hypertension: Secondary | ICD-10-CM

## 2020-01-14 DIAGNOSIS — E782 Mixed hyperlipidemia: Secondary | ICD-10-CM

## 2020-01-15 ENCOUNTER — Other Ambulatory Visit: Payer: Self-pay | Admitting: Family Medicine

## 2020-01-15 ENCOUNTER — Telehealth: Payer: Self-pay | Admitting: Family Medicine

## 2020-01-15 LAB — CMP14+EGFR
ALT: 31 IU/L (ref 0–32)
AST: 21 IU/L (ref 0–40)
Albumin/Globulin Ratio: 1.7 (ref 1.2–2.2)
Albumin: 4.4 g/dL (ref 3.8–4.8)
Alkaline Phosphatase: 117 IU/L (ref 48–121)
BUN/Creatinine Ratio: 16 (ref 12–28)
BUN: 11 mg/dL (ref 8–27)
Bilirubin Total: 0.3 mg/dL (ref 0.0–1.2)
CO2: 24 mmol/L (ref 20–29)
Calcium: 9.1 mg/dL (ref 8.7–10.3)
Chloride: 105 mmol/L (ref 96–106)
Creatinine, Ser: 0.69 mg/dL (ref 0.57–1.00)
GFR calc Af Amer: 104 mL/min/{1.73_m2} (ref >59)
GFR calc non Af Amer: 90 mL/min/{1.73_m2} (ref >59)
Globulin, Total: 2.6 g/dL (ref 1.5–4.5)
Glucose: 109 mg/dL — ABNORMAL HIGH (ref 65–99)
Potassium: 4.4 mmol/L (ref 3.5–5.2)
Sodium: 142 mmol/L (ref 134–144)
Total Protein: 7 g/dL (ref 6.0–8.5)

## 2020-01-15 LAB — LIPID PANEL
Chol/HDL Ratio: 5.6 ratio — ABNORMAL HIGH (ref 0.0–4.4)
Cholesterol, Total: 185 mg/dL (ref 100–199)
HDL: 33 mg/dL — ABNORMAL LOW (ref >39)
LDL Chol Calc (NIH): 84 mg/dL (ref 0–99)
Triglycerides: 421 mg/dL — ABNORMAL HIGH (ref 0–149)
VLDL Cholesterol Cal: 68 mg/dL — ABNORMAL HIGH (ref 5–40)

## 2020-01-15 LAB — VITAMIN D 25 HYDROXY (VIT D DEFICIENCY, FRACTURES): Vit D, 25-Hydroxy: 18.9 ng/mL — ABNORMAL LOW (ref 30.0–100.0)

## 2020-01-15 LAB — TSH: TSH: 2.04 u[IU]/mL (ref 0.450–4.500)

## 2020-01-15 MED ORDER — VITAMIN D (ERGOCALCIFEROL) 1.25 MG (50000 UNIT) PO CAPS: 50000.0000 [IU] | ORAL_CAPSULE | ORAL | 0 refills | Status: DC

## 2020-01-15 NOTE — Telephone Encounter (Signed)
Pt returned missed call regarding lab results. Reviewed results with pt per Dr Jeannett Senior notes. Pt voiced understanding. Pt waiting to hear back from someone on decision of what cholesterol meds are going to be sent in to pharmacy for her (see lab result notes) before picking up her vitamin D Rx.

## 2020-01-15 NOTE — Telephone Encounter (Signed)
So this wasn't for an at home sleep study.  She is waiting for her SLEEP HH to come and set up CPAP.  She had sleep study done with Pulmonology 11/2019.

## 2020-01-15 NOTE — Telephone Encounter (Signed)
NA/VM full. Referral faxed to [facility] at [phone]

## 2020-01-15 NOTE — Telephone Encounter (Signed)
They do.  [facility] should be able to arrange/ read this for her

## 2020-01-15 NOTE — Telephone Encounter (Signed)
Ann Snyder was helping this this.  See 8/30 note

## 2020-01-16 NOTE — Telephone Encounter (Signed)
Additional ppw faxed back to [facility] yesterday afternoon

## 2020-01-16 NOTE — Telephone Encounter (Signed)
Please review lab note and advise.

## 2020-01-17 MED ORDER — ATORVASTATIN CALCIUM 40 MG PO TABS: 40.0000 mg | ORAL_TABLET | Freq: Every day | ORAL | 3 refills | Status: DC

## 2020-01-17 NOTE — Telephone Encounter (Signed)
-  Taking only 1 capsule twice daily (Vascepa) -Encouraged patient to increase to twice daily Vascepa  -Increase to atorvastatin to 40 mg daily -Will revisit zetia vs nexletol if goals not met  Encouraged patient to call if she is unable to tolerate

## 2020-01-17 NOTE — Telephone Encounter (Signed)
Patient is waiting to hear if her cholesterol meds are going to be changed or she keeps taking what she is taking now.

## 2020-01-27 DIAGNOSIS — G4733 Obstructive sleep apnea (adult) (pediatric): Secondary | ICD-10-CM | POA: Diagnosis not present

## 2020-01-30 ENCOUNTER — Other Ambulatory Visit: Payer: Self-pay | Admitting: Family Medicine

## 2020-02-26 DIAGNOSIS — G4733 Obstructive sleep apnea (adult) (pediatric): Secondary | ICD-10-CM | POA: Diagnosis not present

## 2020-03-03 ENCOUNTER — Other Ambulatory Visit: Payer: Self-pay | Admitting: Family Medicine

## 2020-03-03 DIAGNOSIS — Z1231 Encounter for screening mammogram for malignant neoplasm of breast: Secondary | ICD-10-CM

## 2020-03-06 ENCOUNTER — Ambulatory Visit
Admission: RE | Admit: 2020-03-06 | Discharge: 2020-03-06 | Disposition: A | Discharge status: Home / Self Care | Payer: BC Managed Care – PPO | Source: Ambulatory Visit | Attending: Family Medicine | Admitting: Family Medicine

## 2020-03-06 ENCOUNTER — Other Ambulatory Visit: Payer: Self-pay

## 2020-03-06 DIAGNOSIS — Z1231 Encounter for screening mammogram for malignant neoplasm of breast: Secondary | ICD-10-CM

## 2020-03-20 ENCOUNTER — Telehealth: Payer: Self-pay

## 2020-03-20 NOTE — Telephone Encounter (Signed)
Appointment scheduled.

## 2020-03-20 NOTE — Telephone Encounter (Signed)
Pt got her flu shot 5 weeks ago at work. She since then has had a cold and stomach bug. She wants to know if this is normal. Please call back

## 2020-03-20 NOTE — Telephone Encounter (Signed)
Attempted to contact patient - NA °

## 2020-03-20 NOTE — Telephone Encounter (Signed)
rc for nurse 

## 2020-03-24 ENCOUNTER — Ambulatory Visit: Payer: Medicare Other | Admitting: Nurse Practitioner

## 2020-03-24 ENCOUNTER — Encounter: Payer: Self-pay | Admitting: Nurse Practitioner

## 2020-03-24 ENCOUNTER — Ambulatory Visit (INDEPENDENT_AMBULATORY_CARE_PROVIDER_SITE_OTHER): Payer: BC Managed Care – PPO | Admitting: Nurse Practitioner

## 2020-03-24 DIAGNOSIS — A084 Viral intestinal infection, unspecified: Secondary | ICD-10-CM

## 2020-03-24 NOTE — Progress Notes (Signed)
   Virtual Visit via telephone Note Due to COVID-19 pandemic this visit was conducted virtually. This visit type was conducted due to national recommendations for restrictions regarding the COVID-19 Pandemic (e.g. social distancing, sheltering in place) in an effort to limit this patient's exposure and mitigate transmission in our community. All issues noted in this document were discussed and addressed.  A physical exam was not performed with this format.  I connected with [name] on 03/24/20 at 1:10 by telephone and verified that I am speaking with the correct person using two identifiers. HAASINI PATNAUDE is currently located at hospital and a friend is currently with  her during visit. The provider, Mary-Margaret Daphine Deutscher, FNP is located in their office at time of visit.  I discussed the limitations, risks, security and privacy concerns of performing an evaluation and management service by telephone and the availability of in person appointments. I also discussed with the patient that there may be a patient responsible charge related to this service. The patient expressed understanding and agreed to proceed.   History and Present Illness:   Chief Complaint: Nausea   HPI patient calls in stating that 2 weeks ago she had diarrhea and vomiting. She was not able to go to work due to symptoms. It occurred again last Thursday. Only last about 12 hours. She feels fine today. She just does not want it to happen again.    Review of Systems  Constitutional: Positive for malaise/fatigue. Negative for chills and fever.  HENT: Negative.   Respiratory: Negative.   Gastrointestinal: Negative for abdominal pain, constipation, diarrhea, nausea and vomiting.  Skin: Negative.   Psychiatric/Behavioral: Negative.   All other systems reviewed and are negative.    Observations/Objective: Alert and oriented- answers all questions appropriately No distress    Assessment and Plan: MARIACLARA SPEAR in today with chief complaint of Nausea   1. Viral gastroenteritis Has resolved Avoid spicy and fatty foods at night Call if symptoms return    Follow Up Instructions: prn    I discussed the assessment and treatment plan with the patient. The patient was provided an opportunity to ask questions and all were answered. The patient agreed with the plan and demonstrated an understanding of the instructions.   The patient was advised to call back or seek an in-person evaluation if the symptoms worsen or if the condition fails to improve as anticipated.  The above assessment and management plan was discussed with the patient. The patient verbalized understanding of and has agreed to the management plan. Patient is aware to call the clinic if symptoms persist or worsen. Patient is aware when to return to the clinic for a follow-up visit. Patient educated on when it is appropriate to go to the emergency department.   Time call ended:  1:21  I provided 11 minutes of non-face-to-face time during this encounter.    Mary-Margaret Daphine Deutscher, FNP

## 2020-03-28 DIAGNOSIS — G4733 Obstructive sleep apnea (adult) (pediatric): Secondary | ICD-10-CM | POA: Diagnosis not present

## 2020-06-11 ENCOUNTER — Other Ambulatory Visit: Payer: Self-pay | Admitting: Family Medicine

## 2020-08-14 ENCOUNTER — Ambulatory Visit: Payer: BC Managed Care – PPO | Admitting: Family Medicine

## 2020-08-14 ENCOUNTER — Other Ambulatory Visit: Payer: Self-pay

## 2020-08-14 ENCOUNTER — Encounter: Payer: Self-pay | Admitting: Family Medicine

## 2020-08-14 VITALS — BP 127/75 | HR 78 | Temp 97.0°F | Ht 61.0 in | Wt 171.4 lb

## 2020-08-14 DIAGNOSIS — R6884 Jaw pain: Secondary | ICD-10-CM

## 2020-08-14 NOTE — Progress Notes (Signed)
Acute Office Visit  Subjective:    Patient ID: Ann Snyder, female    DOB: March 29, 1951, 70 y.o.   MRN: [id number]  Chief Complaint  Patient presents with  . Jaw Pain    HPI Patient is in today for intermittent jaw pain. It occurs on both sides and lasts for a few minutes at a time. It feels like a ache and sometimes radiates on to her back. This has happened off and on for years. It happens once every few months. She denies chest pain, shortness of breath, or diaphoreses. Denies numbness or tingling down her left arm. No nausea or vomiting. Denies fatigue, edema, of palpitations. She denies significant family history of heart disease. Her husband did pass suddendly at a young age from a heart attack so this has always worried her. She would like to see cardiology.    Past Medical History:  Diagnosis Date  . Anxiety   . Depression   . Elevated blood pressure reading without diagnosis of hypertension 01/20/2015  . Essential hypertension 12/20/2016  . Fungal infection    right big toe  . Headache(784.0)   . High cholesterol   . OSA (obstructive sleep apnea) 10/07/2014   Moderate with AHI 20/hr    Past Surgical History:  Procedure Laterality Date  . BREAST EXCISIONAL BIOPSY Left    2000  . CESAREAN SECTION     3 times    Family History  Problem Relation Age of Onset  . Anxiety disorder Father   . Heart disease Father   . Diabetes Mother   . ADD / ADHD Neg Hx   . Alcohol abuse Neg Hx   . Drug abuse Neg Hx   . Bipolar disorder Neg Hx   . Depression Neg Hx   . Dementia Neg Hx   . OCD Neg Hx   . Paranoid behavior Neg Hx   . Schizophrenia Neg Hx   . Seizures Neg Hx   . Physical abuse Neg Hx   . Sexual abuse Neg Hx     Social History   Socioeconomic History  . Marital status: Widowed    Spouse name: Not on file  . Number of children: Not on file  . Years of education: Not on file  . Highest education level: Not on file  Occupational History  . Not on file   Tobacco Use  . Smoking status: Never Smoker  . Smokeless tobacco: Never Used  Vaping Use  . Vaping Use: Never used  Substance and Sexual Activity  . Alcohol use: No  . Drug use: No  . Sexual activity: Not on file  Other Topics Concern  . Not on file  Social History Narrative  . Not on file   Social Determinants of Health   Financial Resource Strain: Not on file  Food Insecurity: Not on file  Transportation Needs: Not on file  Physical Activity: Not on file  Stress: Not on file  Social Connections: Not on file  Intimate Partner Violence: Not on file    Outpatient Medications Prior to Visit  Medication Sig Dispense Refill  . atorvastatin (LIPITOR) 40 MG tablet Take 1 tablet (40 mg total) by mouth daily. 90 tablet 3  . buPROPion (WELLBUTRIN SR) 150 MG 12 hr tablet Take 150 mg by mouth daily.    . Cholecalciferol (VITAMIN D-3) 125 MCG (5000 UT) TABS Take by mouth.    . icosapent Ethyl (VASCEPA) 1 g capsule TAKE 2 CAPSULES BY MOUTH 2 (TWO)  TIMES DAILY. WITH OR FOLLOWING MEALS 120 capsule 12  . ketoconazole (NIZORAL) 2 % cream Apply 1 application topically daily.    Marland Kitchen lisinopril (ZESTRIL) 10 MG tablet Take 1 tablet (10 mg total) by mouth daily. 90 tablet 3  . mirtazapine (REMERON) 30 MG tablet Take 1 tablet (30 mg total) by mouth at bedtime. 90 tablet 3   No facility-administered medications prior to visit.    No Known Allergies  Review of Systems As per HPI.     Objective:    Physical Exam Vitals and nursing note reviewed.  Constitutional:      General: She is not in acute distress.    Appearance: Normal appearance. She is not ill-appearing, toxic-appearing or diaphoretic.  HENT:     Head: Normocephalic and atraumatic.     Jaw: There is normal jaw occlusion. No tenderness, swelling or pain on movement.  Eyes:     Extraocular Movements: Extraocular movements intact.     Pupils: Pupils are equal, round, and reactive to light.  Neck:     Vascular: No carotid bruit  or JVD.  Cardiovascular:     Rate and Rhythm: Normal rate and regular rhythm.     Pulses: Normal pulses.     Heart sounds: Normal heart sounds.  Pulmonary:     Effort: Pulmonary effort is normal. No respiratory distress.     Breath sounds: Normal breath sounds.  Musculoskeletal:     Cervical back: No rigidity or tenderness. No spinous process tenderness or muscular tenderness.     Right lower leg: No edema.     Left lower leg: No edema.  Skin:    General: Skin is warm and dry.  Neurological:     General: No focal deficit present.     Mental Status: She is alert and oriented to person, place, and time.     Motor: No weakness.     Gait: Gait normal.  Psychiatric:        Mood and Affect: Mood normal.        Behavior: Behavior normal.    EKG: normal EKG, normal sinus rhythm.   BP 127/75   Pulse 78   Temp (!) 97 F (36.1 C) (Temporal)   Ht 5\' 1"  (1.549 m)   Wt 171 lb 6 oz (77.7 kg)   BMI 32.38 kg/m  Wt Readings from Last 3 Encounters:  08/14/20 171 lb 6 oz (77.7 kg)  01/06/20 165 lb 12.8 oz (75.2 kg)  12/02/19 171 lb (77.6 kg)    Health Maintenance Due  Topic Date Due  . TETANUS/TDAP  03/21/2019  . COVID-19 Vaccine (3 - Booster) 12/09/2019  . PNA vac Low Risk Adult (2 of 2 - PPSV23) 01/11/2020    There are no preventive care reminders to display for this patient.   Lab Results  Component Value Date   TSH 2.040 01/14/2020   Lab Results  Component Value Date   WBC 7.4 01/11/2019   HGB 12.6 01/11/2019   HCT 38.5 01/11/2019   MCV 86 01/11/2019   PLT 216 01/11/2019   Lab Results  Component Value Date   NA 142 01/14/2020   K 4.4 01/14/2020   CO2 24 01/14/2020   GLUCOSE 109 (H) 01/14/2020   BUN 11 01/14/2020   CREATININE 0.69 01/14/2020   BILITOT 0.3 01/14/2020   ALKPHOS 117 01/14/2020   AST 21 01/14/2020   ALT 31 01/14/2020   PROT 7.0 01/14/2020   ALBUMIN 4.4 01/14/2020   CALCIUM 9.1  01/14/2020   Lab Results  Component Value Date   CHOL 185  01/14/2020   Lab Results  Component Value Date   HDL 33 (L) 01/14/2020   Lab Results  Component Value Date   LDLCALC 84 01/14/2020   Lab Results  Component Value Date   TRIG 421 (H) 01/14/2020   Lab Results  Component Value Date   CHOLHDL 5.6 (H) 01/14/2020   Lab Results  Component Value Date   HGBA1C 5.3% 03/14/2013       Assessment & Plan:   Addilyn was seen today for jaw pain.  Diagnoses and all orders for this visit:  Jaw pain Intermittent for 1-2 minutes every couple months for year. No other symptoms. EKG normal today in office. Exam unremarkable. Education and handout for symptoms of MI. Jaw pain does not seem to be cardiac however patient is concerned. Referral to cardiology for further.  -     EKG 12-Lead -     Ambulatory referral to Cardiology  The above assessment and management plan was discussed with the patient. The patient verbalized understanding of and has agreed to the management plan. Patient is aware to call the clinic if they develop any new symptoms or if symptoms fail to improve or worsen. Patient is aware when to return to the clinic for a follow-up visit. Patient educated on when it is appropriate to go to the emergency department.   Return if symptoms worsen or fail to improve, for schedule a chonic follow up with PCP.   Gabriel Earing, FNP

## 2020-08-14 NOTE — Patient Instructions (Signed)
Heart Attack The heart is a muscle that needs oxygen to survive. A heart attack is a condition that occurs when your heart does not get enough oxygen. When this happens, the heart muscle begins to die. This can cause permanent damage if not treated right away. A heart attack is a medical emergency. This condition may be called a myocardial infarction, or MI. It is also known as acute coronary syndrome (ACS). ACS is a term used to describe a group of conditions that affect blood flow to the heart. What are the causes? This condition may be caused by:  Atherosclerosis. This occurs when a fatty substance called plaque builds up in the arteries and blocks or reduces blood supply to the heart.  A blood clot. A blood clot can develop suddenly when plaque breaks up within an artery and blocks blood flow to the heart.  Low blood pressure.  An abnormal heartbeat (arrhythmia).  Conditions that cause a decrease of oxygen to the heart, such as anemiaorrespiratory failure.  A spasm, or severe tightening, of a blood vessel that cuts off blood flow to the heart.  Tearing of a coronary artery (spontaneous coronary artery dissection).  High blood pressure.   What increases the risk? The following factors may make you more likely to develop this condition:  Aging. The older you are, the higher your risk.  Having a personal or family history of chest pain, heart attack, stroke, or narrowing of the arteries in the legs, arms, head, or stomach (peripheral artery disease).  Being female.  Smoking.  Not getting regular exercise.  Being overweight or obese.  Having high blood pressure.  Having high cholesterol (hypercholesterolemia).  Having diabetes.  Drinking too much alcohol.  Using illegal drugs, such as cocaine or methamphetamine. What are the signs or symptoms? Symptoms of this condition may vary, depending on factors like gender and age. Symptoms may include:  Chest pain. It may feel  like: ? Crushing or squeezing. ? Tightness, pressure, fullness, or heaviness.  Pain in the arm, neck, jaw, back, or upper body.  Shortness of breath.  Heartburn or upset stomach.  Nausea.  Sudden cold sweats.  Feeling tired.  Sudden light-headedness. How is this diagnosed? This condition may be diagnosed through tests, such as:  Electrocardiogram (ECG) to measure the electrical activity of your heart.  Blood tests to check for cardiac markers. These chemicals are released by a damaged heart muscle.  A test to evaluate blood flow and heart function (coronary angiogram).  CT scan to see the heart more clearly.  A test to evaluate the pumping action of the heart (echocardiogram). How is this treated? A heart attack must be treated as soon as possible. Treatment may include:  Medicines to: ? Break up or dissolve blood clots (fibrinolytic therapy). ? Thin blood and help prevent blood clots. ? Treat blood pressure. ? Improve blood flow to the heart. ? Reduce pain. ? Reduce cholesterol.  Angioplasty and stent placement. These are procedures to widen a blocked artery and keep it open.  Coronary artery bypass graft, CABG, or open heart surgery. This enables blood to flow to the heart by going around the blocked part of the artery.  Oxygen therapy if needed.  Cardiac rehabilitation. This improves your health and well-being through exercise, education, and counseling.   Follow these instructions at home: Medicines  Take over-the-counter and prescription medicines only as told by your health care provider.  Do not take the following medicines unless your health care provider   says it is okay to take them: ? NSAIDs, such as ibuprofen. ? Supplements that contain vitamin A, vitamin E, or both. ? Hormone replacement therapy that contains estrogen with or without progestin. Lifestyle  Do not use any products that contain nicotine or tobacco, such as cigarettes, e-cigarettes,  and chewing tobacco. If you need help quitting, ask your health care provider.  Avoid secondhand smoke.  Exercise regularly. Ask your health care provider about participating in a cardiac rehabilitation program that helps you start exercising safely after a heart attack.  Eat a heart-healthy diet. Your health care provider will tell you what foods to eat.  Maintain a healthy weight.  Learn ways to manage stress.  Do not use illegal drugs.   Alcohol use  Do not drink alcohol if: ? Your health care provider tells you not to drink. ? You are pregnant, may be pregnant, or are planning to become pregnant.  If you drink alcohol: ? Limit how much you use to:  0-1 drink a day for women.  0-2 drinks a day for men. ? Be aware of how much alcohol is in your drink. In the [place] one drink equals one 12 oz bottle of beer (355 mL), one 5 oz glass of wine (148 mL), or one 1 oz glass of hard liquor (44 mL). General instructions  Work with your health care provider to manage any other conditions you have, such as high blood pressure or diabetes. These conditions affect your heart.  Get screened for depression, and seek treatment if needed.  Keep your vaccinations up to date. Get the flu vaccine every year.  Keep all follow-up visits as told by your health care provider. This is important. Contact a health care provider if:  You feel overwhelmed or sad.  You have trouble doing your daily activities. Get help right away if:  You have sudden, unexplained discomfort in your chest, arms, back, neck, jaw, or upper body.  You have shortness of breath.  You suddenly start to sweat or your skin gets clammy.  You feel nauseous or you vomit.  You have unexplained tiredness or weakness.  You suddenly feel light-headed or dizzy.  You notice your heart starts to beat fast or feels like it is skipping beats.  You have blood pressure that is higher than 180/120. These symptoms may represent a  serious problem that is an emergency. Do not wait to see if the symptoms will go away. Get medical help right away. Call your local emergency services (911 in the [place] Do not drive yourself to the hospital. Summary  A heart attack, also called myocardial infarction, is a condition that occurs when your heart does not get enough oxygen. This is caused by anything that blocks or reduces blood flow to the heart.  Treatment is a combination of medicines and surgeries, if needed, to open the blocked arteries and restore blood flow to the heart.  A heart attack is an emergency. Get help right away if you have sudden discomfort in your chest, arms, back, neck, jaw, or upper body. Seek help if you feel nauseous, you vomit, or you feel light-headed or dizzy. This information is not intended to replace advice given to you by your health care provider. Make sure you discuss any questions you have with your health care provider. Document Revised: 08/02/2018 Document Reviewed: 08/06/2018 Elsevier Patient Education  2021 [employer] Inc.  

## 2020-10-21 ENCOUNTER — Other Ambulatory Visit: Payer: Self-pay

## 2020-10-21 ENCOUNTER — Ambulatory Visit: Payer: BC Managed Care – PPO | Admitting: Family Medicine

## 2020-10-21 ENCOUNTER — Encounter: Payer: Self-pay | Admitting: Family Medicine

## 2020-10-21 VITALS — BP 113/74 | HR 80 | Temp 98.3°F | Ht 61.0 in | Wt 164.2 lb

## 2020-10-21 DIAGNOSIS — R7303 Prediabetes: Secondary | ICD-10-CM | POA: Insufficient documentation

## 2020-10-21 DIAGNOSIS — E538 Deficiency of other specified B group vitamins: Secondary | ICD-10-CM | POA: Insufficient documentation

## 2020-10-21 DIAGNOSIS — F418 Other specified anxiety disorders: Secondary | ICD-10-CM | POA: Diagnosis not present

## 2020-10-21 DIAGNOSIS — I10 Essential (primary) hypertension: Secondary | ICD-10-CM

## 2020-10-21 DIAGNOSIS — G4733 Obstructive sleep apnea (adult) (pediatric): Secondary | ICD-10-CM | POA: Diagnosis not present

## 2020-10-21 DIAGNOSIS — E782 Mixed hyperlipidemia: Secondary | ICD-10-CM

## 2020-10-21 MED ORDER — MIRTAZAPINE 30 MG PO TABS
30.0000 mg | ORAL_TABLET | Freq: Every day | ORAL | 3 refills | Status: DC
Start: 2020-10-21 — End: 2021-09-28

## 2020-10-21 MED ORDER — ICOSAPENT ETHYL 1 G PO CAPS: ORAL_CAPSULE | ORAL | 12 refills | Status: DC

## 2020-10-21 MED ORDER — LISINOPRIL 10 MG PO TABS: 10.0000 mg | ORAL_TABLET | Freq: Every day | ORAL | 3 refills | Status: DC

## 2020-10-21 NOTE — Progress Notes (Signed)
Subjective: CC: Follow-up hypertension with hyperlipidemia PCP: Raliegh Ip, DO LKG:[id number] Ann Snyder is a 70 y.o. female presenting to clinic today for:  1.  Hypertension with hyperlipidemia Patient is compliant with lisinopril 10 mg daily, Vascepa 2 capsules twice daily.  She discontinued Lipitor because she felt that it was exacerbating her depressive symptoms last fall.  She has been placed on fenofibrate 48 mg daily after her cholesterol was noted to be elevated.  Her LDL essentially doubled since stopping the statin.  No reports of chest pain, shortness of breath, abdominal pain  2.  Depression with anxiety Patient is compliant with her mirtazapine, Wellbutrin.  Overall symptoms seem to be well controlled but she did have an exacerbation of depression and anxiety after her brother-in-law brought up things about her deceased husband.  She feels well supported by her friends and family.  3.  Prediabetes Patient noted to have A1c of 6.0 upon her last recheck.  She is currently being treated with metformin 500 mg daily.  She is trying to modify diet and exercise.  She unfortunately works in [employer] and is frequently around think she should not eat but she tries to resist these.   ROS: Per HPI  No Known Allergies Past Medical History:  Diagnosis Date   Anxiety    Depression    Elevated blood pressure reading without diagnosis of hypertension 01/20/2015   Essential hypertension 12/20/2016   Fungal infection    right big toe   Headache(784.0)    High cholesterol    OSA (obstructive sleep apnea) 10/07/2014   Moderate with AHI 20/hr    Current Outpatient Medications:    buPROPion (WELLBUTRIN SR) 150 MG 12 hr tablet, Take 150 mg by mouth daily., Disp: , Rfl:    Cholecalciferol (VITAMIN D-3) 125 MCG (5000 UT) TABS, Take by mouth., Disp: , Rfl:    fenofibrate (TRICOR) 48 MG tablet, Take 48 mg by mouth daily., Disp: , Rfl:    icosapent Ethyl (VASCEPA) 1 g capsule,  TAKE 2 CAPSULES BY MOUTH 2 (TWO) TIMES DAILY. WITH OR FOLLOWING MEALS, Disp: 120 capsule, Rfl: 12   lisinopril (ZESTRIL) 10 MG tablet, Take 1 tablet (10 mg total) by mouth daily., Disp: 90 tablet, Rfl: 3   metFORMIN (GLUCOPHAGE) 500 MG tablet, Take by mouth 2 (two) times daily with a meal., Disp: , Rfl:    mirtazapine (REMERON) 30 MG tablet, Take 1 tablet (30 mg total) by mouth at bedtime., Disp: 90 tablet, Rfl: 3   atorvastatin (LIPITOR) 40 MG tablet, Take 1 tablet (40 mg total) by mouth daily. (Patient not taking: Reported on 10/21/2020), Disp: 90 tablet, Rfl: 3 Social History   Socioeconomic History   Marital status: Widowed    Spouse name: Not on file   Number of children: Not on file   Years of education: Not on file   Highest education level: Not on file  Occupational History   Not on file  Tobacco Use   Smoking status: Never   Smokeless tobacco: Never  Vaping Use   Vaping Use: Never used  Substance and Sexual Activity   Alcohol use: No   Drug use: No   Sexual activity: Not on file  Other Topics Concern   Not on file  Social History Narrative   Not on file   Social Determinants of Health   Financial Resource Strain: Not on file  Food Insecurity: Not on file  Transportation Needs: Not on file  Physical Activity: Not  on file  Stress: Not on file  Social Connections: Not on file  Intimate Partner Violence: Not on file   Family History  Problem Relation Age of Onset   Anxiety disorder Father    Heart disease Father    Diabetes Mother    ADD / ADHD Neg Hx    Alcohol abuse Neg Hx    Drug abuse Neg Hx    Bipolar disorder Neg Hx    Depression Neg Hx    Dementia Neg Hx    OCD Neg Hx    Paranoid behavior Neg Hx    Schizophrenia Neg Hx    Seizures Neg Hx    Physical abuse Neg Hx    Sexual abuse Neg Hx     Objective: Office vital signs reviewed. BP 113/74   Pulse 80   Temp 98.3 F (36.8 C)   Ht 5\' 1"  (1.549 m)   Wt 164 lb 3.2 oz (74.5 kg)   SpO2 97%   BMI  31.03 kg/m   Physical Examination:  General: Awake, alert, well nourished, No acute distress HEENT: Normal; sclera white.  PERRLA.  Moist mucous membranes Cardio: regular rate and rhythm, S1S2 heard, no murmurs appreciated Pulm: clear to auscultation bilaterally, no wheezes, rhonchi or rales; normal work of breathing on room air Extremities: warm, well perfused, No edema, cyanosis or clubbing; +2 pulses bilaterally MSK: Normal gait and station Psych: Mood stable.  She is intermittently sad appearing when she talks about her deceased husband had a stress that her brother-in-law caused her with talking about him.  But otherwise she seems to be at baseline  Depression screen [facility] 2/9 10/21/2020 08/14/2020 01/06/2020  Decreased Interest 0 0 0  Down, Depressed, Hopeless 0 0 0  PHQ - 2 Score 0 0 0  Altered sleeping - 0 -  Tired, decreased energy - 0 -  Change in appetite - 0 -  Feeling bad or failure about yourself  - 0 -  Trouble concentrating - 0 -  Moving slowly or fidgety/restless - 0 -  Suicidal thoughts - 0 -  PHQ-9 Score - 0 -  Difficult doing work/chores - Not difficult at all -   GAD 7 : Generalized Anxiety Score 10/21/2020 01/11/2019  Nervous, Anxious, on Edge 0 1  Control/stop worrying 0 1  Worry too much - different things 0 1  Trouble relaxing 0 0  Restless 0 -  Easily annoyed or irritable 0 0  Afraid - awful might happen 0 1  Total GAD 7 Score 0 -  Anxiety Difficulty - Not difficult at all   Assessment/ Plan: 70 y.o. female   Essential hypertension - Plan: lisinopril (ZESTRIL) 10 MG tablet  Mixed hyperlipidemia  Moderate obstructive sleep apnea  Depression with anxiety  Vitamin B12 deficiency  Prediabetes  She seems to be doing relatively well.  We reviewed her labs today.  Technically her most recent A1c is still in prediabetic range but she apparently had a 6.8 A1c about 2 months ago at her job.  I am going to wait and see what future A1c show before placing a  diagnosis of type 2 diabetes on her.  She really is being very aggressive about reducing her carbohydrate intake and improving her physical activity.  She is had successful weight loss as a result.  She has a future labs placed with her job in about 3 months.  Would suggest that if she has ongoing elevation in her LDL that we strongly consider switching  her to a different statin perhaps Crestor would be more tolerated.  In the meantime continue fenofibrate, Vascepa  Blood pressure under good control.  May continue lisinopril at current dose  Depression anxiety per her report are well controlled with mirtazapine and Wellbutrin with only situational fluctuations.  Copy of labs will be scanned into chart.  B12 deficiency being corrected currently with oral B12 and she will have this test rechecked at her next visit with her work nurse  Need to keep a close eye on her A1c since she did have a bump as above.  Continue metformin.  No orders of the defined types were placed in this encounter.  No orders of the defined types were placed in this encounter.    Raliegh Ip, DO [facility] [phone]

## 2020-10-23 ENCOUNTER — Ambulatory Visit: Payer: BC Managed Care – PPO

## 2020-10-27 ENCOUNTER — Other Ambulatory Visit: Payer: Self-pay

## 2020-10-27 ENCOUNTER — Ambulatory Visit (INDEPENDENT_AMBULATORY_CARE_PROVIDER_SITE_OTHER): Payer: BC Managed Care – PPO | Admitting: *Deleted

## 2020-10-27 DIAGNOSIS — Z23 Encounter for immunization: Secondary | ICD-10-CM

## 2020-11-01 ENCOUNTER — Encounter: Payer: Self-pay | Admitting: Cardiology

## 2020-11-01 DIAGNOSIS — R6884 Jaw pain: Secondary | ICD-10-CM | POA: Insufficient documentation

## 2020-11-01 NOTE — Progress Notes (Signed)
Cardiology Office Note   Date:  11/04/2020   ID:  Ann Snyder, DOB 07/18/1950, MRN [id number]  PCP:  Raliegh Ip, DO  Cardiologist:   None Referring:  Gabriel Earing, FNP   Chief Complaint  Patient presents with   Jaw Pain       History of Present Illness: Ann Snyder is a 70 y.o. female who presents for evaluation of chest pain:    She was referred by Gabriel Earing, FNP    She did have a negative POET (Plain Old Exercise Treadmill) in 2016.  She does not really recall why she had this   She actually has done well over the years.  However, in April she was going to get some blood work done and was driving to work at [employer] where this was being offered.  She developed some jaw pain.  This was in her jaw and lasted for several minutes.  It was moderately severe.  It went away spontaneously and came spontaneously.  She had this perhaps sporadically over the years but she does not remember when.  This was in April.  She did get an EKG by her primary provider and there was some poor anterior R wave progression but essentially was unchanged from previous.  Since that time she has not had any further jaw discomfort.  She has no neck chest or arm discomfort.  She has no shortness of breath, PND or orthopnea.  She said no palpitations, presyncope or syncope.  She is actually lost about 9 pounds because she was prediabetic.  She has been walking for exercise and she feels well with this.  Past Medical History:  Diagnosis Date   Anxiety    Depression    Essential hypertension 12/20/2016   Headache(784.0)    High cholesterol    OSA (obstructive sleep apnea) 10/07/2014   Moderate with AHI 20/hr    Past Surgical History:  Procedure Laterality Date   BREAST EXCISIONAL BIOPSY Left    2000   CESAREAN SECTION     3 times     Current Outpatient Medications  Medication Sig Dispense Refill   buPROPion (WELLBUTRIN SR) 150 MG 12 hr tablet Take 150 mg by mouth  daily.     Cholecalciferol (VITAMIN D-3) 125 MCG (5000 UT) TABS Take 5,000 Units by mouth daily.     fenofibrate (TRICOR) 48 MG tablet Take 48 mg by mouth daily.     icosapent Ethyl (VASCEPA) 1 g capsule TAKE 2 CAPSULES BY MOUTH 2 (TWO) TIMES DAILY. WITH OR FOLLOWING MEALS 120 capsule 12   lisinopril (ZESTRIL) 10 MG tablet Take 1 tablet (10 mg total) by mouth daily. 90 tablet 3   metFORMIN (GLUCOPHAGE) 500 MG tablet Take by mouth 2 (two) times daily with a meal.     mirtazapine (REMERON) 30 MG tablet Take 1 tablet (30 mg total) by mouth at bedtime. 90 tablet 3   No current facility-administered medications for this visit.    Allergies:   Patient has no known allergies.    Social History:  The patient  reports that she has never smoked. She has never used smokeless tobacco. She reports that she does not drink alcohol and does not use drugs.   Family History:  The patient's family history includes Anxiety disorder in her father; Diabetes in her mother; Heart disease in her father.    ROS:  Please see the history of present illness.   Otherwise, review of systems  are positive for none.   All other systems are reviewed and negative.    PHYSICAL EXAM: VS:  BP (!) 142/80   Pulse 84   Ht 5\' 2"  (1.575 m)   Wt 164 lb (74.4 kg)   BMI 30.00 kg/m  , BMI Body mass index is 30 kg/m. GENERAL:  Well appearing HEENT:  Pupils equal round and reactive, fundi not visualized, oral mucosa unremarkable NECK:  No jugular venous distention, waveform within normal limits, carotid upstroke brisk and symmetric, no bruits, no thyromegaly LYMPHATICS:  No cervical, inguinal adenopathy LUNGS:  Clear to auscultation bilaterally BACK:  No CVA tenderness CHEST:  Unremarkable HEART:  PMI not displaced or sustained,S1 and S2 within normal limits, no S3, no S4, no clicks, no rubs, no murmurs ABD:  Flat, positive bowel sounds normal in frequency in pitch, no bruits, no rebound, no guarding, no midline pulsatile mass,  no hepatomegaly, no splenomegaly EXT:  2 plus pulses throughout, no edema, no cyanosis no clubbing SKIN:  No rashes no nodules NEURO:  Cranial nerves II through XII grossly intact, motor grossly intact throughout PSYCH:  Cognitively intact, oriented to person place and time    EKG:  EKG is not ordered today. The ekg ordered for 08/14/2020 demonstrates sinus rhythm, rate 82, axis within normal limits, intervals within minutes, poor anterior R wave progression, no acute ST-T wave changes.   Recent Labs: 01/14/2020: ALT 31; BUN 11; Creatinine, Ser 0.69; Potassium 4.4; Sodium 142; TSH 2.040    Lipid Panel    Component Value Date/Time   CHOL 185 01/14/2020 1103   TRIG 421 (H) 01/14/2020 1103   TRIG 280 (H) 03/19/2014 1002   HDL 33 (L) 01/14/2020 1103   HDL 41 03/19/2014 1002   CHOLHDL 5.6 (H) 01/14/2020 1103   CHOLHDL 5.5 09/13/2012 0937   VLDL 49 (H) 09/13/2012 0937   LDLCALC 84 01/14/2020 1103   LDLCALC 76 03/14/2013 0901      Wt Readings from Last 3 Encounters:  11/04/20 164 lb (74.4 kg)  10/21/20 164 lb 3.2 oz (74.5 kg)  08/14/20 171 lb 6 oz (77.7 kg)      Other studies Reviewed: Additional studies/ records that were reviewed today include: EKG, labs. Review of the above records demonstrates:  Please see elsewhere in the note.     ASSESSMENT AND PLAN:  JAW PAIN:   The patient had 1 episode of jaw pain a couple of months ago.  She is otherwise been going well and has had no further symptoms.  She has been exercising without bringing on any symptoms.  She has no acute findings on her EKG.  She has not had any prior cardiac history and has controlled risk factors.  At this point I think the pretest probability of obstructive coronary disease is low and I do not think further testing is indicated.  However, she would let me know if she has any increased symptoms or recurrent episodes at which point I will have a low threshold for POET (Plain Old Exercise Treadmill)  HTN: The  blood pressure is well controlled.  No change in therapy.  RISK REDUCTION: Her LDL was 84 with HDL 33.  No further testing.  No change in therapy.   Current medicines are reviewed at length with the patient today.  The patient does not have concerns regarding medicines.  The following changes have been made:  no change  Labs/ tests ordered today include: None No orders of the defined types were placed  in this encounter.    Disposition:   FU with me as needed   Signed, Rollene Rotunda, MD  11/04/2020 3:51 PM    [facility]

## 2020-11-04 ENCOUNTER — Ambulatory Visit: Payer: BC Managed Care – PPO | Admitting: Cardiology

## 2020-11-04 ENCOUNTER — Encounter: Payer: Self-pay | Admitting: Cardiology

## 2020-11-04 ENCOUNTER — Other Ambulatory Visit: Payer: Self-pay

## 2020-11-04 VITALS — BP 142/80 | HR 84 | Ht 62.0 in | Wt 164.0 lb

## 2020-11-04 DIAGNOSIS — R6884 Jaw pain: Secondary | ICD-10-CM

## 2020-11-04 DIAGNOSIS — I10 Essential (primary) hypertension: Secondary | ICD-10-CM

## 2020-11-04 NOTE — Patient Instructions (Signed)
Medication Instructions:  The current medical regimen is effective;  continue present plan and medications.  *If you need a refill on your cardiac medications before your next appointment, please call your pharmacy*  Follow-Up: At [facility], you and your health needs are our priority.  As part of our continuing mission to provide you with exceptional heart care, we have created designated Provider Care Teams.  These Care Teams include your primary Cardiologist (physician) and Advanced Practice Providers (APPs -  Physician Assistants and Nurse Practitioners) who all work together to provide you with the care you need, when you need it.  We recommend signing up for the patient portal called "MyChart".  Sign up information is provided on this After Visit Summary.  MyChart is used to connect with patients for Virtual Visits (Telemedicine).  Patients are able to view lab/test results, encounter notes, upcoming appointments, etc.  Non-urgent messages can be sent to your provider as well.   To learn more about what you can do with MyChart, go to [email or web address].    Follow up as needed with Dr Hochrein.  Thank you for choosing [facility]!!    

## 2021-02-10 ENCOUNTER — Other Ambulatory Visit: Payer: Self-pay

## 2021-02-10 ENCOUNTER — Ambulatory Visit (INDEPENDENT_AMBULATORY_CARE_PROVIDER_SITE_OTHER): Payer: BC Managed Care – PPO

## 2021-02-10 DIAGNOSIS — Z23 Encounter for immunization: Secondary | ICD-10-CM

## 2021-03-05 ENCOUNTER — Encounter: Payer: Self-pay | Admitting: Family Medicine

## 2021-03-05 ENCOUNTER — Ambulatory Visit: Payer: BC Managed Care – PPO | Admitting: Family Medicine

## 2021-03-05 DIAGNOSIS — A084 Viral intestinal infection, unspecified: Secondary | ICD-10-CM | POA: Diagnosis not present

## 2021-03-05 MED ORDER — ONDANSETRON 4 MG PO TBDP: 4.0000 mg | ORAL_TABLET | Freq: Three times a day (TID) | ORAL | 0 refills | Status: DC | PRN

## 2021-03-05 MED ORDER — FAMOTIDINE 20 MG PO TABS: 20.0000 mg | ORAL_TABLET | Freq: Two times a day (BID) | ORAL | 0 refills | Status: DC

## 2021-03-05 NOTE — Progress Notes (Signed)
Virtual Visit via telephone Note  I connected with BRANDEN VINE on 03/05/21 at 1330 by telephone and verified that I am speaking with the correct person using two identifiers. REX MAGEE is currently located at home and patient are currently with her during visit. The provider, Elige Radon Son Barkan, MD is located in their office at time of visit.  Call ended at 1338  I discussed the limitations, risks, security and privacy concerns of performing an evaluation and management service by telephone and the availability of in person appointments. I also discussed with the patient that there may be a patient responsible charge related to this service. The patient expressed understanding and agreed to proceed.   History and Present Illness: Patient has been having 2 days of nausea and sweats.  She had diarrhea yesterday and has had mild headaches.  She has darker stools today. She has darker stools, but diarrhea is improved. She is feeling better and is still nauseated. She is most concerned about dark stools. She denies abdominal pain.   1. Viral gastroenteritis     Outpatient Encounter Medications as of 03/05/2021  Medication Sig   famotidine (PEPCID) 20 MG tablet Take 1 tablet (20 mg total) by mouth 2 (two) times daily.   ondansetron (ZOFRAN ODT) 4 MG disintegrating tablet Take 1 tablet (4 mg total) by mouth every 8 (eight) hours as needed for nausea or vomiting.   buPROPion (WELLBUTRIN SR) 150 MG 12 hr tablet Take 150 mg by mouth daily.   Cholecalciferol (VITAMIN D-3) 125 MCG (5000 UT) TABS Take 5,000 Units by mouth daily.   fenofibrate (TRICOR) 48 MG tablet Take 48 mg by mouth daily.   icosapent Ethyl (VASCEPA) 1 g capsule TAKE 2 CAPSULES BY MOUTH 2 (TWO) TIMES DAILY. WITH OR FOLLOWING MEALS   lisinopril (ZESTRIL) 10 MG tablet Take 1 tablet (10 mg total) by mouth daily.   metFORMIN (GLUCOPHAGE) 500 MG tablet Take by mouth 2 (two) times daily with a meal.   mirtazapine (REMERON)  30 MG tablet Take 1 tablet (30 mg total) by mouth at bedtime.   No facility-administered encounter medications on file as of 03/05/2021.    Review of Systems  Constitutional:  Negative for chills and fever.  Eyes:  Negative for visual disturbance.  Respiratory:  Negative for chest tightness and shortness of breath.   Cardiovascular:  Negative for chest pain and leg swelling.  Gastrointestinal:  Positive for diarrhea and nausea. Negative for abdominal pain, anal bleeding, blood in stool, constipation and vomiting.  Genitourinary:  Negative for decreased urine volume.  Musculoskeletal:  Negative for back pain and gait problem.  Skin:  Negative for rash.  Neurological:  Negative for light-headedness and headaches.  Psychiatric/Behavioral:  Negative for agitation and behavioral problems.   All other systems reviewed and are negative.  Observations/Objective: Patient sounds comfortable and in no acute distress  Assessment and Plan: Problem List Items Addressed This Visit   None Visit Diagnoses     Viral gastroenteritis    -  Primary   Relevant Medications   ondansetron (ZOFRAN ODT) 4 MG disintegrating tablet   famotidine (PEPCID) 20 MG tablet       Out for one more day at work, stay hydrated and fluids. Follow up plan: Return if symptoms worsen or fail to improve.     I discussed the assessment and treatment plan with the patient. The patient was provided an opportunity to ask questions and all were answered. The patient agreed with  the plan and demonstrated an understanding of the instructions.   The patient was advised to call back or seek an in-person evaluation if the symptoms worsen or if the condition fails to improve as anticipated.  The above assessment and management plan was discussed with the patient. The patient verbalized understanding of and has agreed to the management plan. Patient is aware to call the clinic if symptoms persist or worsen. Patient is aware when  to return to the clinic for a follow-up visit. Patient educated on when it is appropriate to go to the emergency department.    I provided 8 minutes of non-face-to-face time during this encounter.    Nils Pyle, MD

## 2021-04-07 ENCOUNTER — Telehealth: Payer: Self-pay | Admitting: Family Medicine

## 2021-04-07 NOTE — Telephone Encounter (Signed)
lmom to call back and scedule awv

## 2021-04-16 ENCOUNTER — Other Ambulatory Visit: Payer: Self-pay | Admitting: Family Medicine

## 2021-04-23 ENCOUNTER — Encounter: Payer: Self-pay | Admitting: Family Medicine

## 2021-04-23 ENCOUNTER — Ambulatory Visit: Payer: BC Managed Care – PPO | Admitting: Family Medicine

## 2021-04-23 VITALS — BP 144/83 | HR 75 | Temp 97.6°F | Ht 62.0 in | Wt 153.6 lb

## 2021-04-23 DIAGNOSIS — I10 Essential (primary) hypertension: Secondary | ICD-10-CM | POA: Diagnosis not present

## 2021-04-23 DIAGNOSIS — E782 Mixed hyperlipidemia: Secondary | ICD-10-CM

## 2021-04-23 DIAGNOSIS — E663 Overweight: Secondary | ICD-10-CM

## 2021-04-23 MED ORDER — ICOSAPENT ETHYL 1 G PO CAPS: ORAL_CAPSULE | ORAL | 12 refills | Status: DC

## 2021-04-23 MED ORDER — AMLODIPINE BESYLATE 2.5 MG PO TABS: 2.5000 mg | ORAL_TABLET | Freq: Every day | ORAL | 3 refills | Status: DC

## 2021-04-23 NOTE — Patient Instructions (Signed)
Trial of Amlodipine to help Blood pressure and chest pain Have the nurses monitor your BP closely over the next couple weeks.  Goal less than 150/90 but no lower than 110/60.

## 2021-04-23 NOTE — Progress Notes (Signed)
Subjective: CC:HLD, HTN PCP: Raliegh Ip, DO IDP:OEUMPNT MARCILLE BARMAN is a 70 y.o. female presenting to clinic today for:  1. HTN w/ HLD Patient has been working on dietary changes to promote weight loss.  She had labs done at work recently which showed normalized blood sugar.  She was taken off of her metformin.  Her triglycerides were noted to be slightly elevated at 188 and she was advised to increase her fenofibrate to 2 tablets daily and only take 2 tablets of her Vascepa daily.  She denies any shortness of breath, edema or falls.  She continues to have intermittent sharp but not sustained chest pains.  She is had cardiac work-up for this and this was unremarkable.   ROS: Per HPI  No Known Allergies Past Medical History:  Diagnosis Date   Anxiety    Depression    Essential hypertension 12/20/2016   Headache(784.0)    High cholesterol    OSA (obstructive sleep apnea) 10/07/2014   Moderate with AHI 20/hr    Current Outpatient Medications:    buPROPion (WELLBUTRIN SR) 150 MG 12 hr tablet, TAKE 1 TABLET BY MOUTH EVERY DAY IN THE MORNING, Disp: 90 tablet, Rfl: 3   Cholecalciferol (VITAMIN D-3) 125 MCG (5000 UT) TABS, Take 5,000 Units by mouth daily., Disp: , Rfl:    famotidine (PEPCID) 20 MG tablet, Take 1 tablet (20 mg total) by mouth 2 (two) times daily., Disp: 20 tablet, Rfl: 0   fenofibrate (TRICOR) 48 MG tablet, Take 48 mg by mouth daily., Disp: , Rfl:    icosapent Ethyl (VASCEPA) 1 g capsule, TAKE 2 CAPSULES BY MOUTH 2 (TWO) TIMES DAILY. WITH OR FOLLOWING MEALS, Disp: 120 capsule, Rfl: 12   lisinopril (ZESTRIL) 10 MG tablet, Take 1 tablet (10 mg total) by mouth daily., Disp: 90 tablet, Rfl: 3   metFORMIN (GLUCOPHAGE) 500 MG tablet, Take by mouth 2 (two) times daily with a meal., Disp: , Rfl:    mirtazapine (REMERON) 30 MG tablet, Take 1 tablet (30 mg total) by mouth at bedtime., Disp: 90 tablet, Rfl: 3   ondansetron (ZOFRAN ODT) 4 MG disintegrating tablet, Take 1  tablet (4 mg total) by mouth every 8 (eight) hours as needed for nausea or vomiting., Disp: 20 tablet, Rfl: 0 Social History   Socioeconomic History   Marital status: Widowed    Spouse name: Not on file   Number of children: Not on file   Years of education: Not on file   Highest education level: Not on file  Occupational History   Not on file  Tobacco Use   Smoking status: Never   Smokeless tobacco: Never  Vaping Use   Vaping Use: Never used  Substance and Sexual Activity   Alcohol use: No   Drug use: No   Sexual activity: Not on file  Other Topics Concern   Not on file  Social History Narrative   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     Social Determinants of Health   Financial Resource Strain: Not on file  Food Insecurity: Not on file  Transportation Needs: Not on file  Physical Activity: Not on file  Stress: Not on file  Social Connections: Not on file  Intimate Partner Violence: Not on file   Family History  Problem Relation Age of Onset   Diabetes Mother    Anxiety disorder Father    Heart disease Father        No details   ADD / ADHD Neg Hx  Alcohol abuse Neg Hx    Drug abuse Neg Hx    Bipolar disorder Neg Hx    Depression Neg Hx    Dementia Neg Hx    OCD Neg Hx    Paranoid behavior Neg Hx    Schizophrenia Neg Hx    Seizures Neg Hx    Physical abuse Neg Hx    Sexual abuse Neg Hx     Objective: Office vital signs reviewed. BP (!) 144/83    Pulse 75    Temp 97.6 F (36.4 C)    Ht 5\' 2"  (1.575 m)    Wt 153 lb 9.6 oz (69.7 kg)    SpO2 97%    BMI 28.09 kg/m   Physical Examination:  General: Awake, alert, well-appearing female, No acute distress HEENT: Sclera white.  Moist mucous membranes Cardio: regular rate and rhythm, S1S2 heard, no murmurs appreciated Pulm: clear to auscultation bilaterally, no wheezes, rhonchi or rales; normal work of breathing on room air Extremities: warm, well perfused, No edema, cyanosis or clubbing; +2 pulses  bilaterally  Assessment/ Plan: 70 y.o. female   Mixed hyperlipidemia - Plan: Lipid Panel, TSH, Amb ref to Medical Nutrition Therapy-MNT  Essential hypertension - Plan: amLODipine (NORVASC) 2.5 MG tablet, Amb ref to Medical Nutrition Therapy-MNT  Overweight (BMI 25.0-29.9) - Plan: Amb ref to Medical Nutrition Therapy-MNT  I reviewed her most recent lab work.  We will scan this into the chart.  Referral to nutrition as she still wants to maximize weight loss as well as improve comorbidities.  Renewal of medications not needed at this time for cholesterol.  I have updated her MAR to reflect current usage  Blood pressure borderline.  Given reports of intermittent angina that apparently is not ischemic in nature we talked about potentially utilizing amlodipine for symptomology.  She was amenable to this and I have sent in low-dose.  Would like her to have blood pressure monitored closely.  I gave her parameters.  She will follow-up with me as needed on that issue  No orders of the defined types were placed in this encounter.  No orders of the defined types were placed in this encounter.    03-11-2001, DO [facility] [phone]

## 2021-05-06 ENCOUNTER — Other Ambulatory Visit: Payer: Self-pay | Admitting: Family Medicine

## 2021-05-06 DIAGNOSIS — Z139 Encounter for screening, unspecified: Secondary | ICD-10-CM

## 2021-05-19 ENCOUNTER — Ambulatory Visit
Admission: RE | Admit: 2021-05-19 | Discharge: 2021-05-19 | Disposition: A | Discharge status: Home / Self Care | Payer: BC Managed Care – PPO | Source: Ambulatory Visit | Attending: Family Medicine | Admitting: Family Medicine

## 2021-05-19 DIAGNOSIS — Z1231 Encounter for screening mammogram for malignant neoplasm of breast: Secondary | ICD-10-CM | POA: Diagnosis not present

## 2021-05-19 DIAGNOSIS — Z139 Encounter for screening, unspecified: Secondary | ICD-10-CM

## 2021-06-21 ENCOUNTER — Encounter: Payer: BC Managed Care – PPO | Attending: Family Medicine | Admitting: Nutrition

## 2021-06-21 ENCOUNTER — Other Ambulatory Visit: Payer: Self-pay

## 2021-06-21 DIAGNOSIS — R7303 Prediabetes: Secondary | ICD-10-CM | POA: Insufficient documentation

## 2021-06-21 DIAGNOSIS — I10 Essential (primary) hypertension: Secondary | ICD-10-CM | POA: Insufficient documentation

## 2021-06-21 DIAGNOSIS — E782 Mixed hyperlipidemia: Secondary | ICD-10-CM | POA: Insufficient documentation

## 2021-06-21 NOTE — Progress Notes (Signed)
Medical Nutrition Therapy  Appointment Start time:  1300  Appointment End time:  1400  Primary concerns today: Hypertriglyceridemia Referral diagnosis: E78 Preferred learning style:no preferencec. Learning readiness: .ready    NUTRITION ASSESSMENT  She has been working on her eating habits to reduce cholesterol, blood sugars and saturated fats. Blood work for her job showed her TG elevated at 188 mg/dl per PCP notes. Her TG have come down since 2021 when it was 421 mg/dl. She is on Fenofibrate 2 tables a day and also on Vascepa 2 per day. History of VIt D deficiency and is on Vit D 3.   Since working 11-7 pm, her meal schedule had been hectic and was eating late at night and skipping meals. She notes with working so much, she is often tired and doesn't want to cook. But has since changed that. Has lost about 20 lbs since changing eating habit. 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 Drinking more water. Has been walking some. Gets a lot of walking at work-usually 10,000 steps. Had been on Metformin and has been taken off of it with baseline blood sugars.  Anthropometrics  Wt Readings from Last 3 Encounters:  04/23/21 153 lb 9.6 oz (69.7 kg)  11/04/20 164 lb (74.4 kg)  10/21/20 164 lb 3.2 oz (74.5 kg)   Ht Readings from Last 3 Encounters:  04/23/21 5\' 2"  (1.575 m)  11/04/20 5\' 2"  (1.575 m)  10/21/20 5\' 1"  (1.549 m)   There is no height or weight on file to calculate BMI. @BMIFA @ Facility age limit for growth percentiles is 20 years. Facility age limit for growth percentiles is 20 years.    Clinical Medical Hx: see chart. Hyperlipidemia Medications: see chart. No longer on Metformin according to patient. Labs:  This SmartLink has not been configured with any valid records.   CMP Latest Ref Rng & Units 01/14/2020 01/11/2019 03/26/2018  Glucose 65 - 99 mg/dL ) 03/15/2020) 97  BUN 8 - 27 mg/dL 11 17 15   Creatinine 0.57 - 1.00 mg/dL 03/13/2019 03/28/2018 834(H  Sodium 134 - 144 mmol/L 142  143 141  Potassium 3.5 - 5.2 mmol/L 4.4 4.3 4.4  Chloride 96 - 106 mmol/L 105 105 103  CO2 20 - 29 mmol/L 24 25 21   Calcium 8.7 - 10.3 mg/dL 9.1 962(I 9.3  Total Protein 6.0 - 8.5 g/dL 7.0 7.4 -  Total Bilirubin 0.0 - 1.2 mg/dL 0.3 -  Alkaline Phos 48 - 121 IU/L 117 117 -  AST 0 - 40 IU/L 21 29 -  ALT 0 - 32 IU/L 31 37(H) -   Lipid Panel     Component Value Date/Time   CHOL 185 01/14/2020 1103   TRIG 421 (H) 01/14/2020 1103   TRIG 280 (H) 03/19/2014 1002   HDL 33 (L) 01/14/2020 1103   HDL 41 03/19/2014 1002   CHOLHDL 5.6 (H) 01/14/2020 1103   CHOLHDL 5.5 09/13/2012 0937   VLDL 49 (H) 09/13/2012 0937   LDLCALC 84 01/14/2020 1103   LDLCALC 76 03/14/2013 0901   LABVLDL 68 (H) 01/14/2020 1103    Notable Signs/Symptoms: none  Lifestyle & Dietary Hx Works in an assisted living facility. Had been eating a lot of processed and snack foods at work and eating late at night due to work schedule. Is a widow. She notes working as much as she does, she gets very tired and doesn't take time to fix meals since it just her in the home.  Estimated daily fluid  intake: 40 oz Supplements: Vit D 3 Sleep: good Stress / self-care: working too much Current average weekly physical activity: active walking  24-Hr Dietary Recall First Meal: Special K or cherrios, FF skim milk, coffee-sweetner lf creamer Snack:  Second Meal: salmon, green beans, potatoes, water,coffee Snack: fruit,  Third Meal: usually at work-assisted living facility. Snack:  Beverages: water,   Estimated Energy Needs Calories: 1200-1500 Carbohydrate: 135g Protein: 90g Fat: 33g   NUTRITION DIAGNOSIS  NI-5.6.2 Excessive fat intake As related to Hyperlipidemia.  As evidenced by TG 188 mg/dl,  HDL low at 33.Marland Kitchen   NUTRITION INTERVENTION  Nutrition education (E-1) on the following topics:  Lifestyle Medicine - Whole Food, Plant Predominant Nutrition is highly recommended: Eat Plenty of vegetables, Mushrooms, fruits,  Legumes, Whole Grains, Nuts, seeds in lieu of processed meats, processed snacks/pastries red meat, poultry, eggs.    -It is better to avoid simple carbohydrates including: Cakes, Sweet Desserts, Ice Cream, Soda (diet and regular), Sweet Tea, Candies, Chips, Cookies, Store Bought Juices, Alcohol in Excess of  1-2 drinks a day, Lemonade,  Artificial Sweeteners, Doughnuts, Coffee Creamers, "Sugar-free" Products, etc, etc.  This is not a complete list.....  Exercise: If you are able: 30 -60 minutes a day ,4 days a week, or 150 minutes a week.  The longer the better.  Combine stretch, strength, and aerobic activities.  If you were told in the past that you have high risk for cardiovascular diseases, you may seek evaluation by your heart doctor prior to initiating moderate to intense exercise programs.  Handouts Provided Include  Lifestyle medicine plant based meal plan Nutrition Lifestyle Meal Plan Card  Learning Style & Readiness for Change Teaching method utilized: Visual & Auditory  Demonstrated degree of understanding via: Teach Back  Barriers to learning/adherence to lifestyle change: None  Goals Established by Pt Goals Eat three meals per day-don't skip meals Increase high fiber foods for constipation Drink 80-96 oz water per day. Keep walking or exercise Avoid  processed and empty calorie foods. Look up [employer] for recipes Keep up the great job!   MONITORING & EVALUATION Dietary intake, weekly physical activity, and meal planning in 1 month.  Next Steps  Patient is to work on preparing more plant based meals and not skipping meals.Marland Kitchen

## 2021-06-21 NOTE — Patient Instructions (Addendum)
Goals Eat three meals per day-don't skip meals Increase high fiber foods for constipation Drink 80-96 oz water per day. Keep walking or exercise Avoid  processed and empty calorie foods. Look up [employer] for recipes Keep up the great job!

## 2021-07-07 ENCOUNTER — Encounter: Payer: Self-pay | Admitting: Nutrition

## 2021-08-24 ENCOUNTER — Ambulatory Visit: Payer: BC Managed Care – PPO | Admitting: Family Medicine

## 2021-08-24 ENCOUNTER — Encounter: Payer: Self-pay | Admitting: Family Medicine

## 2021-08-24 VITALS — BP 140/78 | HR 90 | Temp 97.6°F | Ht 62.0 in | Wt 152.6 lb

## 2021-08-24 DIAGNOSIS — J069 Acute upper respiratory infection, unspecified: Secondary | ICD-10-CM

## 2021-08-24 DIAGNOSIS — B9689 Other specified bacterial agents as the cause of diseases classified elsewhere: Secondary | ICD-10-CM | POA: Diagnosis not present

## 2021-08-24 MED ORDER — AZITHROMYCIN 250 MG PO TABS: ORAL_TABLET | ORAL | 0 refills | Status: DC

## 2021-08-24 NOTE — Progress Notes (Signed)
? ?Assessment & Plan:  ?1. Bacterial upper respiratory infection ?Education provided on upper respiratory infections.  Patient declined testing for COVID-19.  Continue symptom management. ?- azithromycin (ZITHROMAX Z-PAK) 250 MG tablet; Take 2 tablets (500 mg) PO today, then 1 tablet (250 mg) PO daily x4 days.  Dispense: 6 tablet; Refill: 0 ? ? ?Follow up plan: Return if symptoms worsen or fail to improve. ? ?[name], MSN, APRN, FNP-C ?[facility] ? ?Subjective:  ? ?Patient ID: Ann Snyder, female    DOB: 05-Mar-1951, 71 y.o.   MRN: [id number] ? ?HPI: ?Ann Snyder is a 71 y.o. female presenting on 08/24/2021 for Nasal Congestion, Cough (X 10 days), and lost of taste (X 6 days ) ? ?Patient complains of cough, head congestion, runny nose, postnasal drainage, and loss of taste. Onset of symptoms was  10  days ago, gradually improving since that time. She is drinking plenty of fluids. Evaluation to date: at home COVID test was inconclusive. Treatment to date:  OTC sinus medications . She does not smoke.  ? ? ?ROS: Negative unless specifically indicated above in HPI.  ? ?Relevant past medical history reviewed and updated as indicated.  ? ?Allergies and medications reviewed and updated. ? ? ?Current Outpatient Medications:  ?  amLODipine (NORVASC) 2.5 MG tablet, Take 1 tablet (2.5 mg total) by mouth daily., Disp: 90 tablet, Rfl: 3 ?  buPROPion (WELLBUTRIN SR) 150 MG 12 hr tablet, TAKE 1 TABLET BY MOUTH EVERY DAY IN THE MORNING, Disp: 90 tablet, Rfl: 3 ?  Cholecalciferol (VITAMIN D-3) 125 MCG (5000 UT) TABS, Take 5,000 Units by mouth daily., Disp: , Rfl:  ?  fenofibrate (TRICOR) 48 MG tablet, Take 96 mg by mouth daily., Disp: , Rfl:  ?  icosapent Ethyl (VASCEPA) 1 g capsule, TAKE 2 CAPSULES BY MOUTH qd WITH OR FOLLOWING MEALS, Disp: 120 capsule, Rfl: 12 ?  lisinopril (ZESTRIL) 10 MG tablet, Take 1 tablet (10 mg total) by mouth daily., Disp: 90 tablet, Rfl: 3 ?  mirtazapine (REMERON)  30 MG tablet, Take 1 tablet (30 mg total) by mouth at bedtime., Disp: 90 tablet, Rfl: 3 ? ?No Known Allergies ? ?Objective:  ? ?BP 140/78   Pulse 90   Temp 97.6 ?F (36.4 ?C) (Temporal)   Ht 5\' 2"  (1.575 m)   Wt 152 lb 9.6 oz (69.2 kg)   SpO2 97%   BMI 27.91 kg/m?   ? ?Physical Exam ?Vitals reviewed.  ?Constitutional:   ?   General: She is not in acute distress. ?   Appearance: Normal appearance. She is not ill-appearing, toxic-appearing or diaphoretic.  ?HENT:  ?   Head: Normocephalic and atraumatic.  ?   Right Ear: Tympanic membrane, ear canal and external ear normal. There is no impacted cerumen.  ?   Left Ear: Tympanic membrane, ear canal and external ear normal. There is no impacted cerumen.  ?   Nose: Congestion present. No rhinorrhea.  ?   Mouth/Throat:  ?   Mouth: Mucous membranes are moist.  ?   Pharynx: Oropharynx is clear. No oropharyngeal exudate or posterior oropharyngeal erythema.  ?Eyes:  ?   General: No scleral icterus.    ?   Right eye: No discharge.     ?   Left eye: No discharge.  ?   Conjunctiva/sclera: Conjunctivae normal.  ?Cardiovascular:  ?   Rate and Rhythm: Normal rate and regular rhythm.  ?   Heart sounds: Normal heart sounds. No murmur heard. ?  No friction rub. No gallop.  ?Pulmonary:  ?   Effort: Pulmonary effort is normal. No respiratory distress.  ?   Breath sounds: Normal breath sounds. No stridor. No wheezing, rhonchi or rales.  ?Musculoskeletal:     ?   General: Normal range of motion.  ?   Cervical back: Normal range of motion.  ?Lymphadenopathy:  ?   Cervical: No cervical adenopathy.  ?Skin: ?   General: Skin is warm and dry.  ?   Capillary Refill: Capillary refill takes less than 2 seconds.  ?Neurological:  ?   General: No focal deficit present.  ?   Mental Status: She is alert and oriented to person, place, and time. Mental status is at baseline.  ?Psychiatric:     ?   Mood and Affect: Mood normal.     ?   Behavior: Behavior normal.     ?   Thought Content: Thought content  normal.     ?   Judgment: Judgment normal.  ? ? ? ? ? ? ?

## 2021-08-25 ENCOUNTER — Encounter: Payer: Self-pay | Admitting: Family Medicine

## 2021-09-23 DIAGNOSIS — H43812 Vitreous degeneration, left eye: Secondary | ICD-10-CM | POA: Diagnosis not present

## 2021-09-23 DIAGNOSIS — H538 Other visual disturbances: Secondary | ICD-10-CM | POA: Diagnosis not present

## 2021-09-27 ENCOUNTER — Other Ambulatory Visit: Payer: Self-pay | Admitting: Family Medicine

## 2021-10-03 ENCOUNTER — Other Ambulatory Visit: Payer: Self-pay | Admitting: Family Medicine

## 2021-10-03 DIAGNOSIS — I10 Essential (primary) hypertension: Secondary | ICD-10-CM

## 2021-10-25 DIAGNOSIS — H2513 Age-related nuclear cataract, bilateral: Secondary | ICD-10-CM | POA: Diagnosis not present

## 2021-10-25 DIAGNOSIS — H43812 Vitreous degeneration, left eye: Secondary | ICD-10-CM | POA: Diagnosis not present

## 2021-11-02 ENCOUNTER — Encounter: Payer: Self-pay | Admitting: Family Medicine

## 2021-11-02 ENCOUNTER — Ambulatory Visit (INDEPENDENT_AMBULATORY_CARE_PROVIDER_SITE_OTHER): Payer: BC Managed Care – PPO

## 2021-11-02 ENCOUNTER — Ambulatory Visit: Payer: BC Managed Care – PPO | Admitting: Family Medicine

## 2021-11-02 VITALS — BP 142/83 | HR 85 | Temp 97.9°F | Resp 20 | Ht 62.0 in | Wt 154.0 lb

## 2021-11-02 DIAGNOSIS — M1611 Unilateral primary osteoarthritis, right hip: Secondary | ICD-10-CM | POA: Diagnosis not present

## 2021-11-02 DIAGNOSIS — K5901 Slow transit constipation: Secondary | ICD-10-CM

## 2021-11-02 DIAGNOSIS — M25551 Pain in right hip: Secondary | ICD-10-CM

## 2021-11-02 MED ORDER — POLYETHYLENE GLYCOL 3350 17 GM/SCOOP PO POWD: 17.0000 g | Freq: Two times a day (BID) | ORAL | 1 refills | Status: DC | PRN

## 2021-11-02 MED ORDER — PREDNISONE 20 MG PO TABS: ORAL_TABLET | ORAL | 0 refills | Status: DC

## 2021-11-06 ENCOUNTER — Other Ambulatory Visit: Payer: Self-pay | Admitting: Family Medicine

## 2021-11-19 ENCOUNTER — Other Ambulatory Visit: Payer: Self-pay | Admitting: Family Medicine

## 2021-11-23 ENCOUNTER — Encounter: Payer: Self-pay | Admitting: Family

## 2021-11-23 ENCOUNTER — Ambulatory Visit: Payer: BC Managed Care – PPO | Admitting: Family

## 2021-11-23 VITALS — BP 128/71 | HR 80 | Temp 97.4°F | Ht 62.0 in | Wt 156.0 lb

## 2021-11-23 DIAGNOSIS — T23222A Burn of second degree of single left finger (nail) except thumb, initial encounter: Secondary | ICD-10-CM | POA: Diagnosis not present

## 2021-11-23 MED ORDER — MUPIROCIN 2 % EX OINT: 1.0000 | TOPICAL_OINTMENT | Freq: Two times a day (BID) | CUTANEOUS | 0 refills | Status: DC

## 2021-11-23 NOTE — Progress Notes (Signed)
   Subjective:    Patient ID: Ann Snyder, female    DOB: 14-Jun-1950, 71 y.o.   MRN: [id number]  Chief Complaint  Patient presents with   Hand Burn    Left long finger   HPI  PT presents to the office today with a burn on left middle finger last week on a toaster oven. Ann Snyder saw a nurse at her work look at the area and said it looked like it was getting infected. Since Ann Snyder has tried to keep the area clean and dry.   Denies any fever or discharge.    Review of Systems  All other systems reviewed and are negative.      Objective:   Physical Exam Vitals reviewed.  Constitutional:      General: Ann Snyder is not in acute distress.    Appearance: Ann Snyder is well-developed.  HENT:     Head: Normocephalic and atraumatic.  Eyes:     Pupils: Pupils are equal, round, and reactive to light.  Neck:     Thyroid: No thyromegaly.  Cardiovascular:     Rate and Rhythm: Normal rate and regular rhythm.     Heart sounds: Normal heart sounds. No murmur heard. Pulmonary:     Effort: Pulmonary effort is normal. No respiratory distress.     Breath sounds: Normal breath sounds. No wheezing.  Abdominal:     General: Bowel sounds are normal. There is no distension.     Palpations: Abdomen is soft.     Tenderness: There is no abdominal tenderness.  Musculoskeletal:        General: No tenderness. Normal range of motion.       Hands:     Cervical back: Normal range of motion and neck supple.     Comments: Aprrox 1X0.5 cm ulcer like area on left middle finger. Mild erythemas. No warmth, erythemas, swelling, or tenderness noted  Skin:    General: Skin is warm and dry.  Neurological:     Mental Status: Ann Snyder is alert and oriented to person, place, and time.     Cranial Nerves: No cranial nerve deficit.     Deep Tendon Reflexes: Reflexes are normal and symmetric.  Psychiatric:        Behavior: Behavior normal.        Thought Content: Thought content normal.        Judgment: Judgment normal.     BP  128/71   Pulse 80   Temp (!) 97.4 F (36.3 C)   Ht 5\' 2"  (1.575 m)   Wt 156 lb (70.8 kg)   SpO2 96%   BMI 28.53 kg/m       Assessment & Plan:  Ann Snyder comes in today with chief complaint of Hand Burn (Left long finger)   Diagnosis and orders addressed:  1. Partial thickness burn of finger of left hand, initial encounter Area looking good Continue to keep clean and dry Report increased s/s of infection  Bactroban BID  Follow up if symptoms worsen or do not improve  - mupirocin ointment (BACTROBAN) 2 %; Apply 1 Application topically 2 (two) times daily.  Dispense: 22 g; Refill: 0  [name], FNP

## 2021-11-23 NOTE — Patient Instructions (Signed)
Burn Care, Adult A burn is an injury to the skin or the tissues under the skin. There are three types of burns: First degree. These burns may cause the skin to be red and a bit swollen. Second degree. These burns are very painful and cause the skin to be very red. The skin may also swell, leak fluid, look shiny, and start to have blisters. Third degree. These burns cause lasting damage. They turn the skin white or black and make it look charred, dry, and leathery. Treatment for your burn will depend on the type of burn you have. Taking good care of your burn can help to prevent pain and infection. It can also help the burn heal quickly. How to care for a first-degree burn Right after a burn: Rinse or soak the burn under cool water for 5 minutes or more. Do not put ice on your burn. That can cause more damage. Put a cool, clean, wet cloth on your burn. Put lotion or gel with aloe vera on your burn. Caring for the burn Clean and care for your burn. Your doctor may tell you: To clean the burn using soap and water. To pat the burn dry using a clean cloth. Do not rub or scrub the burn. To put lotion or gel with aloe vera on your burn. How to care for a second-degree burn Right after a burn: Rinse or soak the burn under cool water. Do this for 5 to 10 minutes. Do not put ice on your burn. This can cause more damage. Remove any jewelry near the burned area. Cover the burn with a clean cloth. Caring for the burn Raise (elevate) the burned area above the level of your heart while sitting or lying down. Clean and care for your burn. Your doctor may tell you: To clean or rinse your burn. To put a cream or ointment on the burn. To place a germ-free (sterile) dressing over the burn. A dressing is a material that is placed on a burn to help it heal. How to care for a third-degree burn Right after a burn: Cover the burn with a clean, dry cloth. Seek treatment right away if you have this kind of burn.  You may: Need to stay in the hospital. Have surgery to remove burned tissue. Have surgery to put new skin on the burned area. Be given fluids through an IV tube. Caring for the burn Clean and care for your burn. Your doctor may tell you: To clean or rinse your burn. To put a cream or ointment on the burn. To put a sterile dressing in the burn. This is called packing. To place a sterile dressing over the burn. Other things to do Raise the burned area above the level of your heart while sitting or lying down. Wear splints or immobilizers if told by your doctor. Rest as told by your doctor. Do not do sports or other activities until your doctor approves. How to prevent infection when caring for a burn  Take these steps to prevent infection: Wash your hands with soap and water for at least 20 seconds before and after caring for your burn. If you cannot use soap and water, use hand sanitizer. Wear clean or sterile gloves as told by your doctor. Do not put butter, oil, toothpaste, or other home remedies on the burn. Do not scratch or pick at the burn. Do not break any blisters. Do not peel the skin. Do not rub your burn, even when   you are cleaning it. Check your burn every day for these signs of infection: More redness, swelling, or pain. Warmth. Pus or a bad smell. Red streaks around the burn. Follow these instructions at home Medicines Take over-the-counter and prescription medicines only as told by your doctor. If you were prescribed an antibiotic medicine, use it as told by your doctor. Do not stop using the antibiotic even if your condition gets better. Your doctor may ask you to take medicine for pain before you change your dressing. General instructions  Protect your burn from the sun. Drink enough fluid to keep your pee (urine) pale yellow. Do not use any products that contain nicotine or tobacco, such as cigarettes, e-cigarettes, and chewing tobacco. These can delay healing.  If you need help quitting, ask your doctor. Keep all follow-up visits as told by your doctor. This is important. Contact a doctor if: Your condition does not get better. Your condition gets worse. You have a fever or chills. Your burn feels warm to the touch. You have more redness, swelling, or pain on your burn. Your burn looks different or starts to have black or red spots on it. Your pain does not get better with medicine. Get help right away if: You have more fluid, blood, or pus coming from your burn. You have red streaks near the burn. You have very bad pain. Summary There are three types of burns. They are first degree, second degree, and third degree. Of these, a third-degree burn is most serious. This must be treated right away. Treatment for your burn will depend on the type of burn you have. Do not put butter, oil, toothpaste, or other home remedies on the burn. These things can damage your skin. Follow instructions from your doctor about how to clean and take care of your burn. This information is not intended to replace advice given to you by your health care provider. Make sure you discuss any questions you have with your health care provider. Document Revised: 06/14/2019 Document Reviewed: 02/12/2019 Elsevier Patient Education  2023 [employer] Inc[employer]  

## 2021-12-15 ENCOUNTER — Telehealth: Payer: Self-pay | Admitting: Family Medicine

## 2021-12-20 ENCOUNTER — Ambulatory Visit: Payer: BC Managed Care – PPO | Admitting: Nutrition

## 2022-01-05 ENCOUNTER — Ambulatory Visit: Payer: Medicare Other | Admitting: Family Medicine

## 2022-02-09 ENCOUNTER — Ambulatory Visit: Payer: BC Managed Care – PPO | Admitting: Nutrition

## 2022-02-12 ENCOUNTER — Other Ambulatory Visit: Payer: Self-pay | Admitting: Family Medicine

## 2022-03-08 ENCOUNTER — Ambulatory Visit (INDEPENDENT_AMBULATORY_CARE_PROVIDER_SITE_OTHER): Payer: BC Managed Care – PPO

## 2022-03-08 ENCOUNTER — Encounter: Payer: Self-pay | Admitting: Family Medicine

## 2022-03-08 ENCOUNTER — Ambulatory Visit (INDEPENDENT_AMBULATORY_CARE_PROVIDER_SITE_OTHER): Payer: BC Managed Care – PPO | Admitting: Family Medicine

## 2022-03-08 VITALS — BP 138/79 | HR 83 | Temp 98.2°F | Ht 62.0 in | Wt 160.6 lb

## 2022-03-08 DIAGNOSIS — B351 Tinea unguium: Secondary | ICD-10-CM | POA: Diagnosis not present

## 2022-03-08 DIAGNOSIS — E781 Pure hyperglyceridemia: Secondary | ICD-10-CM

## 2022-03-08 DIAGNOSIS — E538 Deficiency of other specified B group vitamins: Secondary | ICD-10-CM

## 2022-03-08 DIAGNOSIS — J329 Chronic sinusitis, unspecified: Secondary | ICD-10-CM | POA: Diagnosis not present

## 2022-03-08 DIAGNOSIS — M8588 Other specified disorders of bone density and structure, other site: Secondary | ICD-10-CM | POA: Diagnosis not present

## 2022-03-08 DIAGNOSIS — I10 Essential (primary) hypertension: Secondary | ICD-10-CM | POA: Diagnosis not present

## 2022-03-08 DIAGNOSIS — L858 Other specified epidermal thickening: Secondary | ICD-10-CM | POA: Diagnosis not present

## 2022-03-08 DIAGNOSIS — E559 Vitamin D deficiency, unspecified: Secondary | ICD-10-CM

## 2022-03-08 DIAGNOSIS — Z23 Encounter for immunization: Secondary | ICD-10-CM

## 2022-03-08 DIAGNOSIS — F418 Other specified anxiety disorders: Secondary | ICD-10-CM

## 2022-03-08 DIAGNOSIS — R7303 Prediabetes: Secondary | ICD-10-CM

## 2022-03-08 DIAGNOSIS — G4733 Obstructive sleep apnea (adult) (pediatric): Secondary | ICD-10-CM

## 2022-03-08 DIAGNOSIS — Z0000 Encounter for general adult medical examination without abnormal findings: Secondary | ICD-10-CM

## 2022-03-08 DIAGNOSIS — M85851 Other specified disorders of bone density and structure, right thigh: Secondary | ICD-10-CM

## 2022-03-08 MED ORDER — MIRTAZAPINE 30 MG PO TABS: 30.0000 mg | ORAL_TABLET | Freq: Every day | ORAL | 3 refills | Status: DC

## 2022-03-08 MED ORDER — TERBINAFINE HCL 250 MG PO TABS: 250.0000 mg | ORAL_TABLET | Freq: Every day | ORAL | 0 refills | Status: DC

## 2022-03-08 MED ORDER — BUPROPION HCL ER (SR) 150 MG PO TB12: ORAL_TABLET | ORAL | 3 refills | Status: DC

## 2022-03-08 MED ORDER — LEVOCETIRIZINE DIHYDROCHLORIDE 5 MG PO TABS: 5.0000 mg | ORAL_TABLET | Freq: Every evening | ORAL | 99 refills | Status: DC | PRN

## 2022-03-08 MED ORDER — BENZONATATE 100 MG PO CAPS: 100.0000 mg | ORAL_CAPSULE | Freq: Three times a day (TID) | ORAL | 1 refills | Status: DC | PRN

## 2022-03-08 MED ORDER — LISINOPRIL 10 MG PO TABS: 10.0000 mg | ORAL_TABLET | Freq: Every day | ORAL | 3 refills | Status: DC

## 2022-03-08 MED ORDER — AMLODIPINE BESYLATE 2.5 MG PO TABS: 2.5000 mg | ORAL_TABLET | Freq: Every day | ORAL | 3 refills | Status: DC

## 2022-03-08 MED ORDER — FENOFIBRATE 48 MG PO TABS: 48.0000 mg | ORAL_TABLET | Freq: Every day | ORAL | 3 refills | Status: DC

## 2022-03-08 NOTE — Progress Notes (Unsigned)
Ann Snyder is a 71 y.o. female presents to office today for annual physical exam examination.    Concerns today include: Hypertension w/ HLD  The patient is actively engaged in dietary modifications for weight loss and a healthier lifestyle. Does not report shortness of breath, chest pain, edema, or falls. Working with a Designer, jewellery at work, the patient reduced weight from 170 to 155 pounds and avoids sweets. Weight has increased slightly to 160. She maintains compliance with amlodipine and fenofibrate, denies lower extremity swelling, gastrointestinal discomfort, right upper quadrant pain, and postprandial pain. She is not using her sleep apnea machine at night.   Head cold  The patient reports experiencing persistent cough and congestion for the past two weeks, with symptoms first appearing last Monday. Cough is non-productive. No sinus pain. Two of the patient's sons are currently experiencing similar symptoms.  Onychomycosis  The patient reports chronic discomfort related to her toenails, has persisted for several years. She has previously sought evaluation and care from a podiatrist. She is bothered by this from an aesthetic standpoint, notes increased pressure in her shoes while working on her feet for long periods of time.   Right hip pain The X-ray results have revealed arthritis in her hips. The patient has discontinued Tylenol. Discomfort is now only intermittent, taking tylenol as needed.   Constipation The patient was previously seen in June for chronic constipation. She has been using Metamucil gummy for fiber intake and stool softeners, both of which have been effective. Pertinent negatives include the absence of anorexia, back pain, bloating, diarrhea, difficulty urinating, fecal incontinence, fever, flatus, hematochezia, hemorrhoids, melena, nausea, rectal pain, vomiting, or weight loss.   Mood  No depression symptoms at this time  Occupation: Community education officer,  server, Marital status: widowed, Substance use: None Diet: Fair, Exercise: Yes Last eye exam: up to date. Has cataracts. Sees eye doctor regularly.  Last dental exam: this year  Last colonoscopy: 8 years ago.  Last mammogram: gets every year  Refills needed today: Fenofibrate Immunizations needed: Immunization History  Administered Date(s) Administered   Moderna Sars-Covid-2 Vaccination 05/22/2019, 06/11/2019   Pneumococcal Conjugate-13 01/11/2019   Tdap 03/20/2009   Zoster Recombinat (Shingrix) 10/27/2020, 02/10/2021   Past Medical History:  Diagnosis Date   Anxiety    Depression    Essential hypertension 12/20/2016   Headache(784.0)    High cholesterol    OSA (obstructive sleep apnea) 10/07/2014   Moderate with AHI 20/hr   Social History   Socioeconomic History   Marital status: Widowed    Spouse name: Not on file   Number of children: Not on file   Years of education: Not on file   Highest education level: Not on file  Occupational History   Not on file  Tobacco Use   Smoking status: Never   Smokeless tobacco: Never  Vaping Use   Vaping Use: Never used  Substance and Sexual Activity   Alcohol use: No   Drug use: No   Sexual activity: Not on file  Other Topics Concern   Not on file  Social History Narrative   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     Social Determinants of Health   Financial Resource Strain: Not on file  Food Insecurity: Not on file  Transportation Needs: Not on file  Physical Activity: Not on file  Stress: Not on file  Social Connections: Not on file  Intimate Partner Violence: Not on file   Past Surgical History:  Procedure Laterality Date  BREAST EXCISIONAL BIOPSY Left    2000   CESAREAN SECTION     3 times   Family History  Problem Relation Age of Onset   Diabetes Mother    Anxiety disorder Father    Heart disease Father        No details   Breast cancer Maternal Grandmother        in her 94s   ADD / ADHD Neg Hx    Alcohol  abuse Neg Hx    Drug abuse Neg Hx    Bipolar disorder Neg Hx    Depression Neg Hx    Dementia Neg Hx    OCD Neg Hx    Paranoid behavior Neg Hx    Schizophrenia Neg Hx    Seizures Neg Hx    Physical abuse Neg Hx    Sexual abuse Neg Hx     Current Outpatient Medications:    amLODipine (NORVASC) 2.5 MG tablet, Take 1 tablet (2.5 mg total) by mouth daily., Disp: 90 tablet, Rfl: 3   buPROPion (WELLBUTRIN SR) 150 MG 12 hr tablet, TAKE 1 TABLET BY MOUTH EVERY DAY IN THE MORNING, Disp: 90 tablet, Rfl: 0   Cholecalciferol (VITAMIN D-3) 125 MCG (5000 UT) TABS, Take 5,000 Units by mouth daily., Disp: , Rfl:    fenofibrate (TRICOR) 48 MG tablet, Take 96 mg by mouth daily., Disp: , Rfl:    lisinopril (ZESTRIL) 10 MG tablet, TAKE 1 TABLET BY MOUTH EVERY DAY, Disp: 90 tablet, Rfl: 3   mirtazapine (REMERON) 30 MG tablet, TAKE 1 TABLET (30 MG TOTAL) BY MOUTH AT BEDTIME. (NEEDS TO BE SEEN BEFORE NEXT REFILL), Disp: 90 tablet, Rfl: 1   Multiple Vitamins-Minerals (PRESERVISION AREDS PO), Take 1 capsule by mouth daily., Disp: , Rfl:    mupirocin ointment (BACTROBAN) 2 %, Apply 1 Application topically 2 (two) times daily., Disp: 22 g, Rfl: 0   polyethylene glycol powder (GLYCOLAX/MIRALAX) 17 GM/SCOOP powder, Take 17 g by mouth 2 (two) times daily as needed., Disp: 3350 g, Rfl: 1   VASCEPA 1 g capsule, TAKE 2 CAPSULES BY MOUTH 2 (TWO) TIMES DAILY. WITH OR FOLLOWING MEALS, Disp: 120 capsule, Rfl: 12   vitamin B-12 (CYANOCOBALAMIN) 500 MCG tablet, Take 500 mcg by mouth daily., Disp: , Rfl:   No Known Allergies   ROS: Review of Systems Pertinent items are noted in HPI.    Physical exam BP 138/79   Pulse 83   Temp 98.2 F (36.8 C)   Ht _0  (1.575 m)   Wt 160 lb 9.6 oz (72.8 kg)   SpO2 95%   BMI 29.37 kg/m  General appearance: alert, cooperative, and appears stated age Head: Normocephalic, without obvious abnormality, atraumatic Eyes: conjunctivae/corneas clear. PERRL, EOM's intact. Fundi  benign. Ears: normal TM's and external ear canals both ears Nose: Nares normal. Septum midline. Mucosa normal. No drainage or sinus tenderness. Throat: lips, mucosa, and tongue normal; teeth and gums normal Neck: no adenopathy, no carotid bruit, no JVD, supple, symmetrical, trachea midline, and thyroid not enlarged, symmetric, no tenderness/mass/nodules Heart: regular rate and rhythm, S1, S2 normal, no murmur, click, rub or gallop Abdomen: soft, non-tender; bowel sounds normal; no masses,  no organomegaly   Assessment/ Plan:  Rutherford Guys here for an annual physical exam. The patient will return for fasting labs on Friday and a DEXA scan. Additionally, the patient will return for labs to monitor liver function, as she has started an oral antifungal, terbutaline, and Lamisil.  Patient reports a small cutaneous keratotic tag on the frontal area that I removed cryogenically.   Counseled on healthy lifestyle choices, including diet (rich in fruits, vegetables and lean meats and low in salt and simple carbohydrates) and exercise (at least 30 minutes of moderate physical activity daily).  Patient to follow up in 1 year for annual exam or sooner if needed.   No problem-specific Assessment & Plan notes found for this encounter.  Orders Placed This Encounter  Procedures   DG [facility] DEXA    Standing Status:   Future    Standing Expiration Date:   03/09/2023    Order Specific Question:   Reason for Exam (SYMPTOM  OR DIAGNOSIS REQUIRED)    Answer:   screen osteoporosis   Td : Tetanus/diphtheria >7yo Preservative  free   CMP14+EGFR    Standing Status:   Future    Standing Expiration Date:   03/09/2023   Lipid Panel    Standing Status:   Future    Standing Expiration Date:   03/09/2023   Bayer DCA Hb A1c Waived    Standing Status:   Future    Standing Expiration Date:   03/09/2023   TSH    Standing Status:   Future    Standing Expiration Date:   03/09/2023   CBC    Standing Status:    Future    Standing Expiration Date:   03/09/2023   VITAMIN D 25 Hydroxy (Vit-D Deficiency, Fractures)    Standing Status:   Future    Standing Expiration Date:   03/09/2023   Stephani Police, MS3

## 2022-03-08 NOTE — Patient Instructions (Signed)
Come in for fasting labs  I have preordered repeat liver function tests to be done in 6 weeks. ( Do NOT have to fast for this set of labs)  Fungal Nail Infection A fungal nail infection is a common infection of the toenails or fingernails. This condition affects toenails more often than fingernails. It often affects the great, or big, toes. More than one nail may be infected. The condition can be passed from person to person (is contagious). What are the causes? This condition is caused by a fungus, such as yeast or molds. Several types of fungi can cause the infection. These fungi are common in moist and warm areas. If your hands or feet come into contact with the fungus, it may get into a crack in your fingernail or toenail or in the surrounding skin, and cause an infection. What increases the risk? The following factors may make you more likely to develop this condition: Being of older age. Having certain medical conditions, such as: Athlete's foot. Diabetes. Poor circulation. A weak body defense system (immune system). Walking barefoot in areas where the fungus thrives, such as showers or locker rooms. Wearing shoes and socks that cause your feet to sweat. Having a nail injury or a recent nail surgery. What are the signs or symptoms? Symptoms of this condition include: A pale spot on the nail. Thickening of the nail. A nail that becomes yellow, brown, or white. A brittle or ragged nail edge. A nail that has lifted away from the nail bed. How is this diagnosed? This condition is diagnosed with a physical exam. Your health care provider may take a scraping or clipping from your nail to test for the fungus. How is this treated? Treatment is not needed for mild infections. If you have significant nail changes, treatment may include: Antifungal medicines taken by mouth (orally). You may need to take the medicine for several weeks or several months, and you may not see the results for a  long time. These medicines can cause side effects. Ask your health care provider what problems to watch for. Antifungal nail polish or nail cream. These may be used along with oral antifungal medicines. Laser treatment of the nail. Surgery to remove the nail. This may be needed for the most severe infections. It can take a long time, usually up to a year, for the infection to go away. The infection may also come back. Follow these instructions at home: Medicines Take or apply over-the-counter and prescription medicines only as told by your health care provider. Ask your health care provider about using over-the-counter mentholated ointment on your nails. Nail care Trim your nails often. Wash and dry your hands and feet every day. Keep your feet dry. To do this: Wear absorbent socks, and change your socks frequently. Wear shoes that allow air to circulate, such as sandals or canvas tennis shoes. Throw out old shoes. If you go to a nail salon, make sure you choose one that uses clean instruments. Use antifungal foot powder on your feet and in your shoes. General instructions Do not share personal items, such as towels or nail clippers. Do not walk barefoot in shower rooms or locker rooms. Wear rubber gloves if you are working with your hands in wet areas. Keep all follow-up visits. This is important. Contact a health care provider if: You have redness, pain, or pus near the toenail or fingernail. Your infection is not getting better, or it is getting worse after several months. You have  more circulation problems near the toenail or fingernail. You have brown or black discoloration of the nail that spreads to the surrounding skin. Summary A fungal nail infection is a common infection of the toenails or fingernails. Treatment is not needed for mild infections. If you have significant nail changes, treatment may include taking medicine orally and applying medicine to your nails. It can take  a long time, usually up to a year, for the infection to go away. The infection may also come back. Take or apply over-the-counter and prescription medicines only as told by your health care provider. This information is not intended to replace advice given to you by your health care provider. Make sure you discuss any questions you have with your health care provider. Document Revised: 07/27/2020 Document Reviewed: 07/27/2020 Elsevier Patient Education  [place].

## 2022-03-09 DIAGNOSIS — Z780 Asymptomatic menopausal state: Secondary | ICD-10-CM | POA: Diagnosis not present

## 2022-03-09 DIAGNOSIS — M85851 Other specified disorders of bone density and structure, right thigh: Secondary | ICD-10-CM | POA: Diagnosis not present

## 2022-03-14 ENCOUNTER — Other Ambulatory Visit: Payer: Medicare Other

## 2022-03-14 DIAGNOSIS — G4733 Obstructive sleep apnea (adult) (pediatric): Secondary | ICD-10-CM | POA: Diagnosis not present

## 2022-03-14 DIAGNOSIS — I10 Essential (primary) hypertension: Secondary | ICD-10-CM | POA: Diagnosis not present

## 2022-03-14 DIAGNOSIS — M85851 Other specified disorders of bone density and structure, right thigh: Secondary | ICD-10-CM

## 2022-03-14 DIAGNOSIS — E781 Pure hyperglyceridemia: Secondary | ICD-10-CM

## 2022-03-14 DIAGNOSIS — R7303 Prediabetes: Secondary | ICD-10-CM | POA: Diagnosis not present

## 2022-03-14 DIAGNOSIS — E559 Vitamin D deficiency, unspecified: Secondary | ICD-10-CM | POA: Diagnosis not present

## 2022-03-14 LAB — BAYER DCA HB A1C WAIVED: HB A1C (BAYER DCA - WAIVED): 5.9 % — ABNORMAL HIGH (ref 4.8–5.6)

## 2022-03-14 NOTE — Progress Notes (Signed)
Patient came in and was asking about results. Please call back.

## 2022-03-15 LAB — CMP14+EGFR
ALT: 33 IU/L — ABNORMAL HIGH (ref 0–32)
AST: 21 IU/L (ref 0–40)
Albumin/Globulin Ratio: 2 (ref 1.2–2.2)
Albumin: 4.3 g/dL (ref 3.9–4.9)
Alkaline Phosphatase: 96 IU/L (ref 44–121)
BUN/Creatinine Ratio: 22 (ref 12–28)
BUN: 14 mg/dL (ref 8–27)
Bilirubin Total: 0.2 mg/dL (ref 0.0–1.2)
CO2: 24 mmol/L (ref 20–29)
Calcium: 9.7 mg/dL (ref 8.7–10.3)
Chloride: 105 mmol/L (ref 96–106)
Creatinine, Ser: 0.64 mg/dL (ref 0.57–1.00)
Globulin, Total: 2.1 g/dL (ref 1.5–4.5)
Glucose: 104 mg/dL — ABNORMAL HIGH (ref 70–99)
Potassium: 4.7 mmol/L (ref 3.5–5.2)
Sodium: 143 mmol/L (ref 134–144)
Total Protein: 6.4 g/dL (ref 6.0–8.5)
eGFR: 95 mL/min/1.73 (ref >59)

## 2022-03-15 LAB — LIPID PANEL
Chol/HDL Ratio: 4.6 ratio — ABNORMAL HIGH (ref 0.0–4.4)
Cholesterol, Total: 178 mg/dL (ref 100–199)
HDL: 39 mg/dL — ABNORMAL LOW (ref >39)
LDL Chol Calc (NIH): 95 mg/dL (ref 0–99)
Triglycerides: 261 mg/dL — ABNORMAL HIGH (ref 0–149)
VLDL Cholesterol Cal: 44 mg/dL — ABNORMAL HIGH (ref 5–40)

## 2022-03-15 LAB — CBC
Hematocrit: 40.9 % (ref 34.0–46.6)
Hemoglobin: 13 g/dL (ref 11.1–15.9)
MCH: 27.5 pg (ref 26.6–33.0)
MCHC: 31.8 g/dL (ref 31.5–35.7)
MCV: 87 fL (ref 79–97)
Platelets: 247 10*3/uL (ref 150–450)
RBC: 4.73 x10E6/uL (ref 3.77–5.28)
RDW: 13 % (ref 11.7–15.4)
WBC: 5.7 10*3/uL (ref 3.4–10.8)

## 2022-03-15 LAB — VITAMIN D 25 HYDROXY (VIT D DEFICIENCY, FRACTURES): Vit D, 25-Hydroxy: 48.1 ng/mL (ref 30.0–100.0)

## 2022-03-15 LAB — TSH: TSH: 2.23 u[IU]/mL (ref 0.450–4.500)

## 2022-04-27 ENCOUNTER — Telehealth: Payer: Self-pay | Admitting: Family Medicine

## 2022-04-27 DIAGNOSIS — B351 Tinea unguium: Secondary | ICD-10-CM

## 2022-04-27 NOTE — Telephone Encounter (Signed)
Mailbox full- lab placed

## 2022-04-29 ENCOUNTER — Other Ambulatory Visit: Payer: BC Managed Care – PPO

## 2022-04-29 DIAGNOSIS — B351 Tinea unguium: Secondary | ICD-10-CM | POA: Diagnosis not present

## 2022-04-30 LAB — HEPATIC FUNCTION PANEL
ALT: 31 IU/L (ref 0–32)
AST: 20 IU/L (ref 0–40)
Albumin: 4.4 g/dL (ref 3.9–4.9)
Alkaline Phosphatase: 89 IU/L (ref 44–121)
Bilirubin Total: 0.3 mg/dL (ref 0.0–1.2)
Bilirubin, Direct: <0.1 mg/dL (ref 0.00–0.40)
Total Protein: 6.9 g/dL (ref 6.0–8.5)

## 2022-06-01 ENCOUNTER — Ambulatory Visit: Payer: BC Managed Care – PPO | Admitting: Family Medicine

## 2022-06-01 ENCOUNTER — Encounter: Payer: Self-pay | Admitting: Family Medicine

## 2022-06-01 VITALS — BP 149/80 | HR 90 | Temp 97.0°F | Ht 62.0 in | Wt 163.8 lb

## 2022-06-01 DIAGNOSIS — J069 Acute upper respiratory infection, unspecified: Secondary | ICD-10-CM

## 2022-06-01 DIAGNOSIS — R051 Acute cough: Secondary | ICD-10-CM

## 2022-06-01 LAB — VERITOR FLU A/B WAIVED
Influenza A: NEGATIVE
Influenza B: NEGATIVE

## 2022-06-01 NOTE — Progress Notes (Signed)
Subjective:  Patient ID: Ann Snyder, female    DOB: 05-19-1950, 72 y.o.   MRN: [id number]  Patient Care Team: Raliegh Ip, DO as PCP - General (Family Medicine) Danella Maiers, [facility] (Pharmacist)   Chief Complaint:  Sore Throat, Headache, and Nasal Congestion (X 1 day )   HPI: Ann Snyder is a 72 y.o. female presenting on 06/01/2022 for Sore Throat, Headache, and Nasal Congestion (X 1 day )   Sore Throat  This is a new problem. The current episode started yesterday. The problem has been unchanged. There has been no fever. The pain is mild. Associated symptoms include congestion, coughing and headaches. Pertinent negatives include no abdominal pain, diarrhea, drooling, ear discharge, ear pain, hoarse voice, plugged ear sensation, neck pain, shortness of breath, stridor, swollen glands, trouble swallowing or vomiting. She has tried acetaminophen for the symptoms. The treatment provided no relief.  Headache  The current episode started yesterday. The problem has been waxing and waning. The pain is located in the Bilateral region. The pain does not radiate. The quality of the pain is described as aching. The pain is mild. Associated symptoms include coughing, drainage, rhinorrhea and a sore throat. Pertinent negatives include no abdominal pain, abnormal behavior, anorexia, back pain, blurred vision, dizziness, ear pain, eye pain, eye redness, eye watering, facial sweating, fever, hearing loss, insomnia, loss of balance, muscle aches, nausea, neck pain, numbness, phonophobia, photophobia, scalp tenderness, seizures, sinus pressure, swollen glands, tingling, tinnitus, visual change, vomiting, weakness or weight loss. She has tried acetaminophen for the symptoms. The treatment provided mild relief.    Relevant past medical, surgical, family, and social history reviewed and updated as indicated.  Allergies and medications reviewed and updated. Data reviewed: Chart in  Epic.   Past Medical History:  Diagnosis Date   Anxiety    Depression    Essential hypertension 12/20/2016   Headache(784.0)    High cholesterol    OSA (obstructive sleep apnea) 10/07/2014   Moderate with AHI 20/hr    Past Surgical History:  Procedure Laterality Date   BREAST EXCISIONAL BIOPSY Left    2000   CESAREAN SECTION     3 times    Social History   Socioeconomic History   Marital status: Widowed    Spouse name: Not on file   Number of children: Not on file   Years of education: Not on file   Highest education level: Not on file  Occupational History   Not on file  Tobacco Use   Smoking status: Never   Smokeless tobacco: Never  Vaping Use   Vaping Use: Never used  Substance and Sexual Activity   Alcohol use: No   Drug use: No   Sexual activity: Not on file  Other Topics Concern   Not on file  Social History Narrative   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     Social Determinants of Health   Financial Resource Strain: Not on file  Food Insecurity: Not on file  Transportation Needs: Not on file  Physical Activity: Not on file  Stress: Not on file  Social Connections: Not on file  Intimate Partner Violence: Not on file    Outpatient Encounter Medications as of 06/01/2022  Medication Sig   amLODipine (NORVASC) 2.5 MG tablet Take 1 tablet (2.5 mg total) by mouth daily.   buPROPion (WELLBUTRIN SR) 150 MG 12 hr tablet TAKE 1 TABLET BY MOUTH EVERY DAY IN THE MORNING   Cholecalciferol (VITAMIN  D-3) 125 MCG (5000 UT) TABS Take 5,000 Units by mouth daily.   fenofibrate (TRICOR) 48 MG tablet Take 1 tablet (48 mg total) by mouth daily.   levocetirizine (XYZAL) 5 MG tablet Take 1 tablet (5 mg total) by mouth at bedtime as needed for allergies.   lisinopril (ZESTRIL) 10 MG tablet Take 1 tablet (10 mg total) by mouth daily.   Magnesium 125 MG CAPS Take by mouth.   mirtazapine (REMERON) 30 MG tablet Take 1 tablet (30 mg total) by mouth at bedtime.   Multiple  Vitamins-Minerals (PRESERVISION AREDS PO) Take 1 capsule by mouth daily.   mupirocin ointment (BACTROBAN) 2 % Apply 1 Application topically 2 (two) times daily.   polyethylene glycol powder (GLYCOLAX/MIRALAX) 17 GM/SCOOP powder Take 17 g by mouth 2 (two) times daily as needed.   psyllium (METAMUCIL) 58.6 % packet Take 1 packet by mouth daily.   terbinafine (LAMISIL) 250 MG tablet Take 1 tablet (250 mg total) by mouth daily. Come to get labs in 6 weeks.   VASCEPA 1 g capsule TAKE 2 CAPSULES BY MOUTH 2 (TWO) TIMES DAILY. WITH OR FOLLOWING MEALS   vitamin B-12 (CYANOCOBALAMIN) 500 MCG tablet Take 500 mcg by mouth daily.   [DISCONTINUED] benzonatate (TESSALON PERLES) 100 MG capsule Take 1 capsule (100 mg total) by mouth 3 (three) times daily as needed for cough.   No facility-administered encounter medications on file as of 06/01/2022.    No Known Allergies  Review of Systems  Constitutional:  Negative for activity change, appetite change, chills, diaphoresis, fatigue, fever, unexpected weight change and weight loss.  HENT:  Positive for congestion, postnasal drip, rhinorrhea and sore throat. Negative for dental problem, drooling, ear discharge, ear pain, facial swelling, hearing loss, hoarse voice, mouth sores, nosebleeds, sinus pressure, sinus pain, sneezing, tinnitus, trouble swallowing and voice change.   Eyes:  Negative for blurred vision, photophobia, pain and redness.  Respiratory:  Positive for cough. Negative for apnea, choking, chest tightness, shortness of breath, wheezing and stridor.   Gastrointestinal:  Negative for abdominal pain, anorexia, diarrhea, nausea and vomiting.  Genitourinary:  Negative for decreased urine volume and difficulty urinating.  Musculoskeletal:  Negative for back pain and neck pain.  Neurological:  Positive for headaches. Negative for dizziness, tingling, tremors, seizures, syncope, facial asymmetry, speech difficulty, weakness, light-headedness, numbness and  loss of balance.  Psychiatric/Behavioral:  Negative for confusion. The patient does not have insomnia.   All other systems reviewed and are negative.       Objective:  BP (!) 149/80   Pulse 90   Temp (!) 97 F (36.1 C) (Temporal)   Ht 5\' 2"  (1.575 m)   Wt 163 lb 12.8 oz (74.3 kg)   SpO2 97%   BMI 29.96 kg/m    Wt Readings from Last 3 Encounters:  06/01/22 163 lb 12.8 oz (74.3 kg)  03/08/22 160 lb 9.6 oz (72.8 kg)  11/23/21 156 lb (70.8 kg)    Physical Exam Vitals and nursing note reviewed.  Constitutional:      General: She is not in acute distress.    Appearance: Normal appearance. She is well-developed and well-groomed. She is not ill-appearing, toxic-appearing or diaphoretic.  HENT:     Head: Normocephalic and atraumatic.     Jaw: There is normal jaw occlusion.     Right Ear: Hearing, tympanic membrane, ear canal and external ear normal.     Left Ear: Hearing, tympanic membrane and external ear normal.     Nose:  Congestion and rhinorrhea present. Rhinorrhea is clear.     Right Turbinates: Enlarged.     Left Turbinates: Enlarged.     Right Sinus: No maxillary sinus tenderness or frontal sinus tenderness.     Left Sinus: No maxillary sinus tenderness or frontal sinus tenderness.     Mouth/Throat:     Lips: Pink.     Mouth: Mucous membranes are moist.     Pharynx: Oropharynx is clear. Uvula midline. Posterior oropharyngeal erythema present. No pharyngeal swelling, oropharyngeal exudate or uvula swelling.     Tonsils: No tonsillar exudate or tonsillar abscesses.  Eyes:     General: Lids are normal.     Extraocular Movements: Extraocular movements intact.     Conjunctiva/sclera: Conjunctivae normal.     Pupils: Pupils are equal, round, and reactive to light.  Neck:     Thyroid: No thyroid mass, thyromegaly or thyroid tenderness.     Vascular: No carotid bruit or JVD.     Trachea: Trachea and phonation normal.  Cardiovascular:     Rate and Rhythm: Normal rate and  regular rhythm.     Chest Wall: PMI is not displaced.     Pulses: Normal pulses.     Heart sounds: Normal heart sounds. No murmur heard.    No friction rub. No gallop.  Pulmonary:     Effort: Pulmonary effort is normal. No respiratory distress.     Breath sounds: Normal breath sounds. No wheezing.  Abdominal:     General: Bowel sounds are normal. There is no distension or abdominal bruit.     Palpations: Abdomen is soft. There is no hepatomegaly or splenomegaly.     Tenderness: There is no abdominal tenderness. There is no right CVA tenderness or left CVA tenderness.     Hernia: No hernia is present.  Musculoskeletal:        General: Normal range of motion.     Cervical back: Normal range of motion and neck supple.     Right lower leg: No edema.     Left lower leg: No edema.  Lymphadenopathy:     Cervical: No cervical adenopathy.  Skin:    General: Skin is warm and dry.     Capillary Refill: Capillary refill takes less than 2 seconds.     Coloration: Skin is not cyanotic, jaundiced or pale.     Findings: No rash.  Neurological:     General: No focal deficit present.     Mental Status: She is alert and oriented to person, place, and time.     Sensory: Sensation is intact.     Motor: Motor function is intact.     Coordination: Coordination is intact.     Gait: Gait is intact.     Deep Tendon Reflexes: Reflexes are normal and symmetric.  Psychiatric:        Attention and Perception: Attention and perception normal.        Mood and Affect: Mood and affect normal.        Speech: Speech normal.        Behavior: Behavior normal. Behavior is cooperative.        Thought Content: Thought content normal.        Cognition and Memory: Cognition and memory normal.        Judgment: Judgment normal.     Results for orders placed or performed in visit on 04/29/22  Hepatic function panel  Result Value Ref Range   Total Protein 6.9 6.0 -  8.5 g/dL   Albumin 4.4 3.9 - 4.9 g/dL   Bilirubin  Total 0.3 0.0 - 1.2 mg/dL   Bilirubin, Direct <0.10 0.00 - 0.40 mg/dL   Alkaline Phosphatase 89 44 - 121 IU/L   AST 20 0 - 40 IU/L   ALT 31 0 - 32 IU/L       Pertinent labs & imaging results that were available during my care of the patient were reviewed by me and considered in my medical decision making.  Assessment & Plan:  Lashai was seen today for sore throat, headache and nasal congestion.  Diagnoses and all orders for this visit:  Acute cough URI with cough and congestion Influenza negative. Other viral testing pending. No indications of acute bacterial infection. Will start antiviral therapy if warranted. Symptomatic care discussed in detail. Aware to report new, worsening, or persistent symptoms.  -     COVID-19, Flu A+B and RSV -     Veritor Flu A/B Waived     Continue all other maintenance medications.  Follow up plan: Return if symptoms worsen or fail to improve.   Continue healthy lifestyle choices, including diet (rich in fruits, vegetables, and lean proteins, and low in salt and simple carbohydrates) and exercise (at least 30 minutes of moderate physical activity daily).  Educational handout given for URI  The above assessment and management plan was discussed with the patient. The patient verbalized understanding of and has agreed to the management plan. Patient is aware to call the clinic if they develop any new symptoms or if symptoms persist or worsen. Patient is aware when to return to the clinic for a follow-up visit. Patient educated on when it is appropriate to go to the emergency department.   Monia Pouch, FNP-C [facility] [phone]

## 2022-06-02 LAB — COVID-19, FLU A+B AND RSV
Influenza A, NAA: NOT DETECTED
Influenza B, NAA: NOT DETECTED
RSV, NAA: NOT DETECTED
SARS-CoV-2, NAA: NOT DETECTED

## 2022-06-18 ENCOUNTER — Other Ambulatory Visit: Payer: Self-pay | Admitting: Family Medicine

## 2022-06-18 DIAGNOSIS — B351 Tinea unguium: Secondary | ICD-10-CM

## 2022-06-30 ENCOUNTER — Other Ambulatory Visit: Payer: Self-pay | Admitting: Family Medicine

## 2022-06-30 DIAGNOSIS — B351 Tinea unguium: Secondary | ICD-10-CM

## 2022-07-05 ENCOUNTER — Encounter: Payer: Self-pay | Admitting: Family

## 2022-07-05 ENCOUNTER — Ambulatory Visit: Payer: BC Managed Care – PPO | Admitting: Family

## 2022-07-05 ENCOUNTER — Ambulatory Visit (INDEPENDENT_AMBULATORY_CARE_PROVIDER_SITE_OTHER): Payer: BC Managed Care – PPO

## 2022-07-05 VITALS — BP 145/77 | HR 82 | Temp 97.7°F | Ht 62.0 in | Wt 165.6 lb

## 2022-07-05 DIAGNOSIS — M546 Pain in thoracic spine: Secondary | ICD-10-CM

## 2022-07-05 DIAGNOSIS — M40204 Unspecified kyphosis, thoracic region: Secondary | ICD-10-CM | POA: Diagnosis not present

## 2022-07-05 MED ORDER — BACLOFEN 10 MG PO TABS: 5.0000 mg | ORAL_TABLET | Freq: Three times a day (TID) | ORAL | 0 refills | Status: DC

## 2022-07-05 MED ORDER — DICLOFENAC SODIUM 75 MG PO TBEC: 75.0000 mg | DELAYED_RELEASE_TABLET | Freq: Two times a day (BID) | ORAL | 0 refills | Status: DC

## 2022-07-05 NOTE — Patient Instructions (Signed)

## 2022-07-05 NOTE — Progress Notes (Signed)
Subjective:    Patient ID: Ann Snyder, female    DOB: 1951/01/21, 72 y.o.   MRN: [id number]  Chief Complaint  Patient presents with   Back Pain    UPPER BACK PAIN X 2 WEEKS. HER JOB SHE IS ON HER FEET A LOT. JABBING STABBING PAIN    Pt presents to the office today with thoracic back pain that started 2 weeks ago.  Back Pain This is a new problem. The current episode started 1 to 4 weeks ago. The problem occurs intermittently. The problem has been waxing and waning since onset. The pain is present in the thoracic spine. The quality of the pain is described as stabbing. The pain does not radiate. The pain is at a severity of 8/10. The pain is mild. Pertinent negatives include no bowel incontinence, dysuria or fever. Risk factors include obesity. She has tried bed rest and NSAIDs for the symptoms. The treatment provided mild relief.      Review of Systems  Constitutional:  Negative for fever.  Gastrointestinal:  Negative for bowel incontinence.  Genitourinary:  Negative for dysuria.  Musculoskeletal:  Positive for back pain.  All other systems reviewed and are negative.      Objective:   Physical Exam Vitals reviewed.  Constitutional:      General: She is not in acute distress.    Appearance: She is well-developed.  HENT:     Head: Normocephalic and atraumatic.  Eyes:     Pupils: Pupils are equal, round, and reactive to light.  Neck:     Thyroid: No thyromegaly.  Cardiovascular:     Rate and Rhythm: Normal rate and regular rhythm.     Heart sounds: Normal heart sounds. No murmur heard. Pulmonary:     Effort: Pulmonary effort is normal. No respiratory distress.     Breath sounds: Normal breath sounds. No wheezing.  Abdominal:     General: Bowel sounds are normal. There is no distension.     Palpations: Abdomen is soft.     Tenderness: There is no abdominal tenderness.  Musculoskeletal:        General: No tenderness. Normal range of motion.     Cervical back:  Normal range of motion and neck supple.  Skin:    General: Skin is warm and dry.  Neurological:     Mental Status: She is alert and oriented to person, place, and time.     Cranial Nerves: No cranial nerve deficit.     Deep Tendon Reflexes: Reflexes are normal and symmetric.  Psychiatric:        Behavior: Behavior normal.        Thought Content: Thought content normal.        Judgment: Judgment normal.       BP (!) 145/77   Pulse 82   Temp 97.7 F (36.5 C) (Temporal)   Ht '5\' 2"'$  (1.575 m)   Wt 165 lb 9.6 oz (75.1 kg)   SpO2 97%   BMI 30.29 kg/m      Assessment & Plan:   Ann Snyder comes in today with chief complaint of Back Pain (UPPER BACK PAIN X 2 WEEKS. HER JOB SHE IS ON HER FEET A LOT. JABBING STABBING PAIN )   Diagnosis and orders addressed:  1. Acute bilateral thoracic back pain Rest ROM exercises discussed  Diclofenac BID with food for 7-10 days No other NSAID's  Sedation precautions with baclofen, may need to take a night only - diclofenac (  VOLTAREN) 75 MG EC tablet; Take 1 tablet (75 mg total) by mouth 2 (two) times daily.  Dispense: 30 tablet; Refill: 0 - baclofen (LIORESAL) 10 MG tablet; Take 0.5 tablets (5 mg total) by mouth 3 (three) times daily.  Dispense: 30 each; Refill: 0 - [facility] [place], [place]

## 2022-07-11 ENCOUNTER — Telehealth: Payer: Self-pay | Admitting: Family Medicine

## 2022-07-11 NOTE — Telephone Encounter (Signed)
Ann Snyder saw her for this.  Maybe she'll send in Prednisone? I'll cc christy to see if she feels that is appropriate

## 2022-07-11 NOTE — Telephone Encounter (Signed)
Pt seen 07/05/2022. Her LT shoulder pain is not better. It feels like a stabbing pain. She says that the rx given are not working. No available apts for tomorrow for pt to come in.

## 2022-07-12 MED ORDER — PREDNISONE 20 MG PO TABS: 40.0000 mg | ORAL_TABLET | Freq: Every day | ORAL | 0 refills | Status: DC

## 2022-07-12 NOTE — Telephone Encounter (Signed)
Prednisone Prescription sent to pharmacy   

## 2022-07-12 NOTE — Telephone Encounter (Signed)
Lmtcb.

## 2022-07-13 ENCOUNTER — Ambulatory Visit: Payer: BC Managed Care – PPO | Admitting: Family Medicine

## 2022-07-13 ENCOUNTER — Encounter: Payer: Self-pay | Admitting: Family Medicine

## 2022-07-13 VITALS — BP 201/93 | HR 92 | Temp 97.5°F | Ht 62.0 in | Wt 164.2 lb

## 2022-07-13 DIAGNOSIS — I10 Essential (primary) hypertension: Secondary | ICD-10-CM | POA: Diagnosis not present

## 2022-07-13 DIAGNOSIS — M25512 Pain in left shoulder: Secondary | ICD-10-CM | POA: Diagnosis not present

## 2022-07-13 DIAGNOSIS — R6889 Other general symptoms and signs: Secondary | ICD-10-CM | POA: Diagnosis not present

## 2022-07-13 MED ORDER — LISINOPRIL 20 MG PO TABS: 20.0000 mg | ORAL_TABLET | Freq: Every day | ORAL | 3 refills | Status: DC

## 2022-07-13 NOTE — Telephone Encounter (Signed)
Called and spoke with patient she states she has an appointment this morning with [facility]

## 2022-07-13 NOTE — Progress Notes (Signed)
Subjective:  Patient ID: Ann Snyder, female    DOB: October 22, 1950, 72 y.o.   MRN: [id number]  Patient Care Team: Janora Norlander, DO as PCP - General (Family Medicine) Lavera Guise, [facility], [place] (Pharmacist)   Chief Complaint:  Acute bilateral thoracic back pain (Patient was seen by Alyse Low and states she is no better ) and Hypertension   HPI: Ann Snyder is a 72 y.o. female presenting on 07/13/2022 for Acute bilateral thoracic back pain (Patient was seen by Alyse Low and states she is no better ) and Hypertension    1. Acute pain of left shoulder Pt reports ongoing pain to upper back and left posterior shoulder pain. She was evaluated on 07/05/2022 and placed on baclofen and voltaren. She did not improve so prednisone was sent in. She has not started this. She states the pain is sharp and stabbing in nature, worse with movement. No associated chest pain, shortness of breath, palpitations, diaphoresis, weakness, dizziness, or syncope.   2. Uncontrolled hypertension Pt was told yesterday during her eye exam that she has hypertensive retinopathy. Her blood pressure is uncontrolled. She states she is taking her medication as prescribed. Denies visual changes.      Relevant past medical, surgical, family, and social history reviewed and updated as indicated.  Allergies and medications reviewed and updated. Data reviewed: Chart in Epic.   Past Medical History:  Diagnosis Date   Anxiety    Depression    Essential hypertension 12/20/2016   Headache(784.0)    High cholesterol    OSA (obstructive sleep apnea) 10/07/2014   Moderate with AHI 20/hr    Past Surgical History:  Procedure Laterality Date   BREAST EXCISIONAL BIOPSY Left    2000   CESAREAN SECTION     3 times    Social History   Socioeconomic History   Marital status: Widowed    Spouse name: Not on file   Number of children: Not on file   Years of education: Not on file   Highest education level: Not on  file  Occupational History   Not on file  Tobacco Use   Smoking status: Never   Smokeless tobacco: Never  Vaping Use   Vaping Use: Never used  Substance and Sexual Activity   Alcohol use: No   Drug use: No   Sexual activity: Not on file  Other Topics Concern   Not on file  Social History Narrative   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     Social Determinants of Health   Financial Resource Strain: Not on file  Food Insecurity: Not on file  Transportation Needs: Not on file  Physical Activity: Not on file  Stress: Not on file  Social Connections: Not on file  Intimate Partner Violence: Not on file    Outpatient Encounter Medications as of 07/13/2022  Medication Sig   amLODipine (NORVASC) 2.5 MG tablet Take 1 tablet (2.5 mg total) by mouth daily.   baclofen (LIORESAL) 10 MG tablet Take 0.5 tablets (5 mg total) by mouth 3 (three) times daily.   buPROPion (WELLBUTRIN SR) 150 MG 12 hr tablet TAKE 1 TABLET BY MOUTH EVERY DAY IN THE MORNING   Cholecalciferol (VITAMIN D-3) 125 MCG (5000 UT) TABS Take 5,000 Units by mouth daily.   diclofenac (VOLTAREN) 75 MG EC tablet Take 1 tablet (75 mg total) by mouth 2 (two) times daily.   fenofibrate (TRICOR) 48 MG tablet Take 1 tablet (48 mg total) by mouth daily.  levocetirizine (XYZAL) 5 MG tablet Take 1 tablet (5 mg total) by mouth at bedtime as needed for allergies.   lisinopril (ZESTRIL) 20 MG tablet Take 1 tablet (20 mg total) by mouth daily.   Magnesium 125 MG CAPS Take by mouth.   mirtazapine (REMERON) 30 MG tablet Take 1 tablet (30 mg total) by mouth at bedtime.   Multiple Vitamins-Minerals (PRESERVISION AREDS PO) Take 1 capsule by mouth daily.   mupirocin ointment (BACTROBAN) 2 % Apply 1 Application topically 2 (two) times daily.   polyethylene glycol powder (GLYCOLAX/MIRALAX) 17 GM/SCOOP powder Take 17 g by mouth 2 (two) times daily as needed.   psyllium (METAMUCIL) 58.6 % packet Take 1 packet by mouth daily.   terbinafine (LAMISIL) 250  MG tablet Take 1 tablet (250 mg total) by mouth daily. Come to get labs in 6 weeks.   VASCEPA 1 g capsule TAKE 2 CAPSULES BY MOUTH 2 (TWO) TIMES DAILY. WITH OR FOLLOWING MEALS   vitamin B-12 (CYANOCOBALAMIN) 500 MCG tablet Take 500 mcg by mouth daily.   [DISCONTINUED] lisinopril (ZESTRIL) 10 MG tablet Take 1 tablet (10 mg total) by mouth daily.   [DISCONTINUED] predniSONE (DELTASONE) 20 MG tablet Take 2 tablets (40 mg total) by mouth daily with breakfast for 5 days.   No facility-administered encounter medications on file as of 07/13/2022.    No Known Allergies  Review of Systems  Constitutional:  Negative for activity change, appetite change, chills, diaphoresis, fatigue, fever and unexpected weight change.  HENT: Negative.    Eyes: Negative.  Negative for photophobia and visual disturbance.  Respiratory:  Negative for cough, chest tightness and shortness of breath.   Cardiovascular:  Negative for chest pain, palpitations and leg swelling.  Gastrointestinal:  Negative for abdominal pain, blood in stool, constipation, diarrhea, nausea and vomiting.  Endocrine: Negative.   Genitourinary:  Negative for decreased urine volume, difficulty urinating, dysuria, frequency and urgency.  Musculoskeletal:  Positive for arthralgias (left posterior shoulder pain, left upper back pain). Negative for back pain, gait problem, joint swelling, myalgias, neck pain and neck stiffness.  Skin: Negative.   Allergic/Immunologic: Negative.   Neurological:  Negative for dizziness and headaches.  Hematological: Negative.   Psychiatric/Behavioral:  Negative for agitation, behavioral problems, confusion, decreased concentration, dysphoric mood, hallucinations, self-injury, sleep disturbance and suicidal ideas. The patient is nervous/anxious. The patient is not hyperactive.   All other systems reviewed and are negative.       Objective:  BP (!) 201/93   Pulse 92   Temp (!) 97.5 F (36.4 C) (Temporal)   Ht '5\' 2"'$   (1.575 m)   Wt 164 lb 3.2 oz (74.5 kg)   SpO2 97%   BMI 30.03 kg/m    Wt Readings from Last 3 Encounters:  07/13/22 164 lb 3.2 oz (74.5 kg)  07/05/22 165 lb 9.6 oz (75.1 kg)  06/01/22 163 lb 12.8 oz (74.3 kg)    Physical Exam Vitals and nursing note reviewed.  Constitutional:      General: She is not in acute distress.    Appearance: Normal appearance. She is well-developed and well-groomed. She is not ill-appearing, toxic-appearing or diaphoretic.  HENT:     Head: Normocephalic and atraumatic.     Jaw: There is normal jaw occlusion.     Right Ear: Hearing normal.     Left Ear: Hearing normal.     Nose: Nose normal.     Mouth/Throat:     Lips: Pink.     Mouth: Mucous membranes  are moist.     Pharynx: Oropharynx is clear. Uvula midline.  Eyes:     General: Lids are normal.     Conjunctiva/sclera: Conjunctivae normal.     Pupils: Pupils are equal, round, and reactive to light.  Neck:     Thyroid: No thyroid mass, thyromegaly or thyroid tenderness.     Vascular: No carotid bruit or JVD.     Trachea: Trachea and phonation normal.  Cardiovascular:     Rate and Rhythm: Normal rate and regular rhythm.     Chest Wall: PMI is not displaced.     Pulses: Normal pulses.     Heart sounds: Normal heart sounds. No murmur heard.    No friction rub. No gallop.  Pulmonary:     Effort: Pulmonary effort is normal. No respiratory distress.     Breath sounds: Normal breath sounds. No wheezing.  Abdominal:     General: There is no abdominal bruit.     Palpations: There is no hepatomegaly or splenomegaly.  Musculoskeletal:     Left shoulder: Tenderness present. No deformity, effusion, laceration, bony tenderness or crepitus. Decreased range of motion. Normal strength. Normal pulse.     Left upper arm: Normal.     Cervical back: Normal, normal range of motion and neck supple.     Right lower leg: No edema.     Left lower leg: No edema.  Lymphadenopathy:     Cervical: No cervical  adenopathy.  Skin:    General: Skin is warm and dry.     Capillary Refill: Capillary refill takes less than 2 seconds.     Coloration: Skin is not cyanotic, jaundiced or pale.     Findings: No rash.  Neurological:     General: No focal deficit present.     Mental Status: She is alert and oriented to person, place, and time.     Cranial Nerves: No cranial nerve deficit.     Sensory: Sensation is intact.     Motor: Motor function is intact.     Coordination: Coordination is intact.     Gait: Gait is intact.     Deep Tendon Reflexes: Reflexes are normal and symmetric.  Psychiatric:        Attention and Perception: Attention and perception normal.        Mood and Affect: Affect normal. Mood is anxious.        Speech: Speech normal.        Behavior: Behavior normal. Behavior is cooperative.        Thought Content: Thought content normal.        Cognition and Memory: Cognition and memory normal.        Judgment: Judgment normal.     Results for orders placed or performed in visit on 06/01/22  COVID-19, Flu A+B and RSV   Specimen: Nasopharyngeal(NP) swabs in vial transport medium  Result Value Ref Range   SARS-CoV-2, NAA Not Detected Not Detected   Influenza A, NAA Not Detected Not Detected   Influenza B, NAA Not Detected Not Detected   RSV, NAA Not Detected Not Detected   Test Information: Comment   Veritor Flu A/B Waived  Result Value Ref Range   Influenza A Negative Negative   Influenza B Negative Negative     EKG: SR 93, PR 154 ms, QT 346 ms, LVH pattern. No acute ST-T changes, no ectopy. No significant changes from prior EKG. Monia Pouch, FNP-C  Pertinent labs & imaging results that were  available during my care of the patient were reviewed by me and considered in my medical decision making.  Assessment & Plan:  Anapaola was seen today for acute bilateral thoracic back pain and hypertension.  Diagnoses and all orders for this visit:  Acute pain of left shoulder Has  been seen for this and is on baclofen and voltaren. She was prescribed prednisone today but has not started. EKG without acute findings. No concerns for ACS as pain is worse with movement and stabbing in nature. No diaphoresis, fatigue, weakness, confusion, shortness of breath, palpitations, or syncope.  -     EKG 12-Lead  Uncontrolled hypertension EKG with slight LVH pattern, no acute findings. Hypertensive retinopathy noted at eye exam. Will increase lisinopril to 20 mg today, continue amlodipine. DASH diet and exercise discussed in detail. Labs pending. Aware to follow up in 2 weeks for reevaluation.  -     EKG 12-Lead -     lisinopril (ZESTRIL) 20 MG tablet; Take 1 tablet (20 mg total) by mouth daily. -     BMP8+EGFR -     Thyroid Panel With TSH     Continue all other maintenance medications.  Follow up plan: Return in about 2 weeks (around 07/27/2022) for BP.   Continue healthy lifestyle choices, including diet (rich in fruits, vegetables, and lean proteins, and low in salt and simple carbohydrates) and exercise (at least 30 minutes of moderate physical activity daily).  Educational handout given for DASH diet, HTN  The above assessment and management plan was discussed with the patient. The patient verbalized understanding of and has agreed to the management plan. Patient is aware to call the clinic if they develop any new symptoms or if symptoms persist or worsen. Patient is aware when to return to the clinic for a follow-up visit. Patient educated on when it is appropriate to go to the emergency department.   Monia Pouch, FNP-C [facility] [phone]

## 2022-07-13 NOTE — Patient Instructions (Signed)

## 2022-07-14 LAB — BMP8+EGFR
BUN/Creatinine Ratio: 28 (ref 12–28)
BUN: 18 mg/dL (ref 8–27)
CO2: 19 mmol/L — ABNORMAL LOW (ref 20–29)
Calcium: 9.9 mg/dL (ref 8.7–10.3)
Chloride: 104 mmol/L (ref 96–106)
Creatinine, Ser: 0.64 mg/dL (ref 0.57–1.00)
Glucose: 160 mg/dL — ABNORMAL HIGH (ref 70–99)
Potassium: 3.9 mmol/L (ref 3.5–5.2)
Sodium: 142 mmol/L (ref 134–144)
eGFR: 95 mL/min/{1.73_m2} (ref >59)

## 2022-07-14 LAB — THYROID PANEL WITH TSH
Free Thyroxine Index: 2.4 (ref 1.2–4.9)
T3 Uptake Ratio: 29 % (ref 24–39)
T4, Total: 8.4 ug/dL (ref 4.5–12.0)
TSH: 1.82 u[IU]/mL (ref 0.450–4.500)

## 2022-07-16 ENCOUNTER — Other Ambulatory Visit: Payer: Self-pay | Admitting: Family

## 2022-07-16 DIAGNOSIS — M546 Pain in thoracic spine: Secondary | ICD-10-CM

## 2022-07-22 ENCOUNTER — Other Ambulatory Visit: Payer: Self-pay | Admitting: Family

## 2022-07-22 DIAGNOSIS — M546 Pain in thoracic spine: Secondary | ICD-10-CM

## 2022-07-25 ENCOUNTER — Other Ambulatory Visit: Payer: Self-pay | Admitting: Family Medicine

## 2022-07-25 DIAGNOSIS — Z1231 Encounter for screening mammogram for malignant neoplasm of breast: Secondary | ICD-10-CM

## 2022-07-27 ENCOUNTER — Encounter: Payer: Self-pay | Admitting: Family Medicine

## 2022-07-27 ENCOUNTER — Ambulatory Visit: Payer: BC Managed Care – PPO | Admitting: Family Medicine

## 2022-07-27 ENCOUNTER — Ambulatory Visit
Admission: RE | Admit: 2022-07-27 | Discharge: 2022-07-27 | Disposition: A | Discharge status: Home / Self Care | Payer: BC Managed Care – PPO | Source: Ambulatory Visit | Attending: Family Medicine | Admitting: Family Medicine

## 2022-07-27 VITALS — BP 157/81 | HR 74 | Temp 98.4°F | Ht 62.0 in | Wt 165.0 lb

## 2022-07-27 DIAGNOSIS — Z1231 Encounter for screening mammogram for malignant neoplasm of breast: Secondary | ICD-10-CM

## 2022-07-27 DIAGNOSIS — I10 Essential (primary) hypertension: Secondary | ICD-10-CM | POA: Diagnosis not present

## 2022-07-27 DIAGNOSIS — E781 Pure hyperglyceridemia: Secondary | ICD-10-CM | POA: Diagnosis not present

## 2022-07-27 DIAGNOSIS — M546 Pain in thoracic spine: Secondary | ICD-10-CM | POA: Diagnosis not present

## 2022-07-27 DIAGNOSIS — M25512 Pain in left shoulder: Secondary | ICD-10-CM

## 2022-07-27 DIAGNOSIS — F418 Other specified anxiety disorders: Secondary | ICD-10-CM

## 2022-07-27 DIAGNOSIS — R739 Hyperglycemia, unspecified: Secondary | ICD-10-CM | POA: Diagnosis not present

## 2022-07-27 LAB — BAYER DCA HB A1C WAIVED: HB A1C (BAYER DCA - WAIVED): 6.3 % — ABNORMAL HIGH (ref 4.8–5.6)

## 2022-07-27 MED ORDER — MIRTAZAPINE 30 MG PO TABS: 30.0000 mg | ORAL_TABLET | Freq: Every day | ORAL | 3 refills | Status: DC

## 2022-07-27 MED ORDER — BUPROPION HCL ER (SR) 150 MG PO TB12: ORAL_TABLET | ORAL | 3 refills | Status: DC

## 2022-07-27 MED ORDER — AMLODIPINE BESYLATE 5 MG PO TABS: 5.0000 mg | ORAL_TABLET | Freq: Every day | ORAL | 3 refills | Status: DC

## 2022-07-27 MED ORDER — FENOFIBRATE 48 MG PO TABS: 48.0000 mg | ORAL_TABLET | Freq: Every day | ORAL | 3 refills | Status: DC

## 2022-07-27 MED ORDER — ICOSAPENT ETHYL 1 G PO CAPS: ORAL_CAPSULE | ORAL | 12 refills | Status: DC

## 2022-07-27 NOTE — Progress Notes (Signed)
Subjective: CC:HTN PCP: Janora Norlander, DO [id number] Ann Snyder is a 72 y.o. female presenting to clinic today for:  1. HTN/shoulder pain/back pain Patient has significant elevation in blood pressure at her visit 2 weeks ago.  She was having severe left-sided back and shoulder pain at that time that was refractory to NSAIDs, Tylenol etc.  She notes that pain is getting slightly better but still present.  She has muscle relaxers on hand if needed.  She would be amenable to physical therapy for that back and shoulder pain at this point.  No weakness in that arm.  No chest pain, shortness of breath or blurred vision.  She has a nurse at her work check her blood pressures for her and they are still running 150s over 80s typically.  She is compliant with all medications   ROS: Per HPI  No Known Allergies Past Medical History:  Diagnosis Date   Anxiety    Depression    Essential hypertension 12/20/2016   Headache(784.0)    High cholesterol    OSA (obstructive sleep apnea) 10/07/2014   Moderate with AHI 20/hr    Current Outpatient Medications:    amLODipine (NORVASC) 2.5 MG tablet, Take 1 tablet (2.5 mg total) by mouth daily., Disp: 90 tablet, Rfl: 3   baclofen (LIORESAL) 10 MG tablet, Take 0.5 tablets (5 mg total) by mouth 3 (three) times daily., Disp: 30 each, Rfl: 0   buPROPion (WELLBUTRIN SR) 150 MG 12 hr tablet, TAKE 1 TABLET BY MOUTH EVERY DAY IN THE MORNING, Disp: 90 tablet, Rfl: 3   Cholecalciferol (VITAMIN D-3) 125 MCG (5000 UT) TABS, Take 5,000 Units by mouth daily., Disp: , Rfl:    diclofenac (VOLTAREN) 75 MG EC tablet, TAKE 1 TABLET BY MOUTH TWICE A DAY, Disp: 30 tablet, Rfl: 0   fenofibrate (TRICOR) 48 MG tablet, Take 1 tablet (48 mg total) by mouth daily., Disp: 90 tablet, Rfl: 3   levocetirizine (XYZAL) 5 MG tablet, Take 1 tablet (5 mg total) by mouth at bedtime as needed for allergies., Disp: 30 tablet, Rfl: PRN   lisinopril (ZESTRIL) 20 MG tablet, Take 1 tablet  (20 mg total) by mouth daily., Disp: 90 tablet, Rfl: 3   Magnesium 125 MG CAPS, Take by mouth., Disp: , Rfl:    mirtazapine (REMERON) 30 MG tablet, Take 1 tablet (30 mg total) by mouth at bedtime., Disp: 90 tablet, Rfl: 3   Multiple Vitamins-Minerals (PRESERVISION AREDS PO), Take 1 capsule by mouth daily., Disp: , Rfl:    mupirocin ointment (BACTROBAN) 2 %, Apply 1 Application topically 2 (two) times daily., Disp: 22 g, Rfl: 0   polyethylene glycol powder (GLYCOLAX/MIRALAX) 17 GM/SCOOP powder, Take 17 g by mouth 2 (two) times daily as needed., Disp: 3350 g, Rfl: 1   psyllium (METAMUCIL) 58.6 % packet, Take 1 packet by mouth daily., Disp: , Rfl:    terbinafine (LAMISIL) 250 MG tablet, Take 1 tablet (250 mg total) by mouth daily. Come to get labs in 6 weeks., Disp: 90 tablet, Rfl: 0   VASCEPA 1 g capsule, TAKE 2 CAPSULES BY MOUTH 2 (TWO) TIMES DAILY. WITH OR FOLLOWING MEALS, Disp: 120 capsule, Rfl: 12   vitamin B-12 (CYANOCOBALAMIN) 500 MCG tablet, Take 500 mcg by mouth daily., Disp: , Rfl:  Social History   Socioeconomic History   Marital status: Widowed    Spouse name: Not on file   Number of children: Not on file   Years of education: Not on file  Highest education level: Not on file  Occupational History   Not on file  Tobacco Use   Smoking status: Never   Smokeless tobacco: Never  Vaping Use   Vaping Use: Never used  Substance and Sexual Activity   Alcohol use: No   Drug use: No   Sexual activity: Not on file  Other Topics Concern   Not on file  Social History Narrative   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     Social Determinants of Health   Financial Resource Strain: Not on file  Food Insecurity: Not on file  Transportation Needs: Not on file  Physical Activity: Not on file  Stress: Not on file  Social Connections: Not on file  Intimate Partner Violence: Not on file   Family History  Problem Relation Age of Onset   Diabetes Mother    Anxiety disorder Father    Heart  disease Father        No details   Breast cancer Maternal Grandmother        in her 60s   ADD / ADHD Neg Hx    Alcohol abuse Neg Hx    Drug abuse Neg Hx    Bipolar disorder Neg Hx    Depression Neg Hx    Dementia Neg Hx    OCD Neg Hx    Paranoid behavior Neg Hx    Schizophrenia Neg Hx    Seizures Neg Hx    Physical abuse Neg Hx    Sexual abuse Neg Hx     Objective: Office vital signs reviewed. BP (!) 157/81   Pulse 74   Temp 98.4 F (36.9 C)   Ht 5\' 2"  (1.575 m)   Wt 165 lb (74.8 kg)   SpO2 97%   BMI 30.18 kg/m   Physical Examination:  General: Awake, alert, well nourished, No acute distress HEENT: sclera white, MMM Cardio: regular rate and rhythm, S1S2 heard, no murmurs appreciated Pulm: clear to auscultation bilaterally, no wheezes, rhonchi or rales; normal work of breathing on room air MSK: Tenderness palpation over the rhomboid on the left side.  She has no tenderness palpation to the rotator cuff.  Range of motion is preserved.  Assessment/ Plan: 72 y.o. female   Essential hypertension - Plan: amLODipine (NORVASC) 5 MG tablet  Hypertriglyceridemia - Plan: fenofibrate (TRICOR) 48 MG tablet  Depression with anxiety - Plan: buPROPion (WELLBUTRIN SR) 150 MG 12 hr tablet, mirtazapine (REMERON) 30 MG tablet  Acute left-sided thoracic back pain - Plan: Ambulatory referral to Physical Therapy  Acute pain of left shoulder - Plan: Ambulatory referral to Physical Therapy  Elevated serum glucose - Plan: Bayer DCA Hb A1c Waived  Blood pressure still not at goal so I will advance her amlodipine to 5 mg daily.  She will continue lisinopril prescribed dose.  Did not discuss hypertriglyceridemia or depression with anxiety today but needed refills so these have been sent  For her thoracic back and shoulder pain I am going to place referral to physical therapy.  If no significant improvement with physical therapy, neck step will be orthopedics  Check A1c given elevation in  glucose.  Of note she was nonfasting during that visit  No orders of the defined types were placed in this encounter.  No orders of the defined types were placed in this encounter.    Janora Norlander, DO [place] [phone]

## 2022-08-08 ENCOUNTER — Ambulatory Visit: Payer: BC Managed Care – PPO | Admitting: Physical Therapy

## 2022-08-11 ENCOUNTER — Ambulatory Visit: Payer: BC Managed Care – PPO | Admitting: Physical Therapy

## 2022-08-16 ENCOUNTER — Encounter: Payer: BC Managed Care – PPO | Admitting: Physical Therapy

## 2022-08-18 ENCOUNTER — Encounter: Payer: BC Managed Care – PPO | Admitting: Physical Therapy

## 2022-09-06 DIAGNOSIS — M79676 Pain in unspecified toe(s): Secondary | ICD-10-CM | POA: Diagnosis not present

## 2022-09-06 DIAGNOSIS — B351 Tinea unguium: Secondary | ICD-10-CM | POA: Diagnosis not present

## 2022-10-04 ENCOUNTER — Other Ambulatory Visit: Payer: Self-pay | Admitting: Family

## 2022-10-04 ENCOUNTER — Other Ambulatory Visit: Payer: Self-pay | Admitting: Family Medicine

## 2022-10-04 DIAGNOSIS — M546 Pain in thoracic spine: Secondary | ICD-10-CM

## 2022-10-04 DIAGNOSIS — B351 Tinea unguium: Secondary | ICD-10-CM

## 2022-10-11 DIAGNOSIS — H35033 Hypertensive retinopathy, bilateral: Secondary | ICD-10-CM | POA: Diagnosis not present

## 2022-12-06 DIAGNOSIS — M79676 Pain in unspecified toe(s): Secondary | ICD-10-CM | POA: Diagnosis not present

## 2022-12-06 DIAGNOSIS — B351 Tinea unguium: Secondary | ICD-10-CM | POA: Diagnosis not present

## 2023-02-19 ENCOUNTER — Other Ambulatory Visit: Payer: Self-pay | Admitting: Family Medicine

## 2023-02-19 DIAGNOSIS — M546 Pain in thoracic spine: Secondary | ICD-10-CM

## 2023-03-14 DIAGNOSIS — B351 Tinea unguium: Secondary | ICD-10-CM | POA: Diagnosis not present

## 2023-03-14 DIAGNOSIS — M79676 Pain in unspecified toe(s): Secondary | ICD-10-CM | POA: Diagnosis not present

## 2023-04-27 ENCOUNTER — Other Ambulatory Visit: Payer: Self-pay | Admitting: Family Medicine

## 2023-04-27 DIAGNOSIS — I10 Essential (primary) hypertension: Secondary | ICD-10-CM

## 2023-05-05 ENCOUNTER — Ambulatory Visit: Payer: Medicare Other | Admitting: Family Medicine

## 2023-05-19 ENCOUNTER — Other Ambulatory Visit (HOSPITAL_COMMUNITY): Payer: Self-pay

## 2023-05-23 ENCOUNTER — Encounter: Payer: Self-pay | Admitting: Nurse Practitioner

## 2023-05-23 ENCOUNTER — Ambulatory Visit (INDEPENDENT_AMBULATORY_CARE_PROVIDER_SITE_OTHER): Payer: Medicare Other

## 2023-05-23 ENCOUNTER — Ambulatory Visit: Payer: Self-pay | Admitting: Family Medicine

## 2023-05-23 ENCOUNTER — Ambulatory Visit: Payer: Self-pay | Admitting: Nurse Practitioner

## 2023-05-23 VITALS — BP 150/82 | HR 86 | Temp 97.7°F | Ht 62.0 in | Wt 165.4 lb

## 2023-05-23 DIAGNOSIS — M546 Pain in thoracic spine: Secondary | ICD-10-CM | POA: Diagnosis not present

## 2023-05-23 DIAGNOSIS — M25561 Pain in right knee: Secondary | ICD-10-CM

## 2023-05-23 MED ORDER — DICLOFENAC SODIUM 75 MG PO TBEC
75.0000 mg | DELAYED_RELEASE_TABLET | Freq: Two times a day (BID) | ORAL | 0 refills | Status: DC
Start: 2023-05-23 — End: 2023-08-21

## 2023-05-23 NOTE — Progress Notes (Signed)
 Acute Office Visit  Subjective:     Patient ID: Ann Snyder, female    DOB: 1950-07-01, 73 y.o.   MRN: [id number]  Chief Complaint  Patient presents with   Knee Pain    Right knee pain for about 1.5 weeks. Woke up with it throbbing, ibuprofen helps. Stands on feet all day 8+ hours a day    HPI Knee Pain: Patient presents with knee pain involving the  right knee. Onset of the symptoms was 1 weeks ago. Inciting event: none known. Current symptoms include pain located knee cap . Pain is aggravated by going up and down stairs, standing, and walking.  Patient has had prior knee problems. Evaluation to date: none. Treatment to date: OTC analgesics which are somewhat effective.   Active Ambulatory Problems    Diagnosis Date Noted   Depression with anxiety 11/08/2011   Metabolic syndrome 05/29/2013   Osteopenia 05/29/2013   Moderate obstructive sleep apnea 10/07/2014   Hypertriglyceridemia 01/21/2015   Knee pain 06/29/2015   Skin nodule 12/14/2015   Essential hypertension 12/20/2016   Prediabetes 10/21/2020   Vitamin B12 deficiency 10/21/2020   Jaw pain 11/01/2020   Acute bilateral thoracic back pain 05/23/2023   Resolved Ambulatory Problems    Diagnosis Date Noted   MDD (major depressive disorder), recurrent episode, moderate (HCC) 06/25/2012   Hyperlipidemia 05/29/2013   Chest pain 07/31/2014   Snoring 07/31/2014   Elevated blood pressure reading without diagnosis of hypertension 01/20/2015   Healthcare maintenance 01/20/2015   Obsessive thinking 05/30/2019   Past Medical History:  Diagnosis Date   Anxiety    Depression    Headache(784.0)    High cholesterol    OSA (obstructive sleep apnea) 10/07/2014    ROS Negative unless indicated in HPI    Objective:    BP (!) 150/82 Comment: pt has not taken meds today  Pulse 86   Temp 97.7 F (36.5 C) (Temporal)   Ht 5' 2 (1.575 m)   Wt 165 lb 6.4 oz (75 kg)   SpO2 94%   BMI 30.25 kg/m  BP Readings from Last 3  Encounters:  05/23/23 (!) 150/82  07/27/22 (!) 157/81  07/13/22 (!) 201/93   Wt Readings from Last 3 Encounters:  05/23/23 165 lb 6.4 oz (75 kg)  07/27/22 165 lb (74.8 kg)  07/13/22 164 lb 3.2 oz (74.5 kg)      Physical Exam Vitals and nursing note reviewed.  Constitutional:      Appearance: Normal appearance. She is obese.  HENT:     Head: Normocephalic and atraumatic.     Nose: Nose normal.     Mouth/Throat:     Mouth: Mucous membranes are moist.  Eyes:     General: No scleral icterus.    Extraocular Movements: Extraocular movements intact.     Conjunctiva/sclera: Conjunctivae normal.     Pupils: Pupils are equal, round, and reactive to light.  Cardiovascular:     Rate and Rhythm: Normal rate and regular rhythm.  Pulmonary:     Effort: Pulmonary effort is normal.     Breath sounds: Normal breath sounds.  Musculoskeletal:     Right knee: No swelling, bony tenderness or crepitus. Normal range of motion. Tenderness present. No ACL tenderness.     Left knee: Normal.  Skin:    General: Skin is warm and dry.  Neurological:     Mental Status: She is alert and oriented to person, place, and time.  Psychiatric:  Mood and Affect: Mood normal.        Behavior: Behavior normal.        Thought Content: Thought content normal.        Judgment: Judgment normal.    A right knee X-Ray was ordered. My reading of this film is preliminary. (No comparison films available: pending review by Radiologist.)  No results found for any visits on 05/23/23.      Assessment & Plan:  Acute pain of right knee -     DG Knee 1-2 Views Right -     Diclofenac  Sodium; Take 1 tablet (75 mg total) by mouth 2 (two) times daily.  Dispense: 30 tablet; Refill: 0  Acute bilateral thoracic back pain  [name] is 73 year old Caucasian female seen today for right knee pain, no acute distress Right knee pain: X-ray ordered awaiting final report from radiologist; apply Voltaren  gel 3 times daily as  needed.  Client is instructed to get a knee sleeve and wear while standing for long hours. Continue taking all prescribed medications.  Continue healthy lifestyle choices, including diet (rich in fruits, vegetables, and lean proteins, and low in salt and simple carbohydrates) and exercise (at least 30 minutes of moderate physical activity daily).     The above assessment and management plan was discussed with the patient. The patient verbalized understanding of and has agreed to the management plan. Patient is aware to call the clinic if they develop any new symptoms or if symptoms persist or worsen. Patient is aware when to return to the clinic for a follow-up visit. Patient educated on when it is appropriate to go to the emergency department.  Return for with PCP as already scheduled.  Dakhari Zuver [place], DNP [facility] [address] [place], [place] [address] [phone]  Note: This document was prepared by [employer] voice dictation technology and any errors that results from this process are unintentional.

## 2023-05-23 NOTE — Telephone Encounter (Signed)
  Chief Complaint: R knee pain Symptoms: pain Frequency: A little over a week Pertinent Negatives: Patient denies Redness or streaking, injury, swelling Disposition: [] ED /[] Urgent Care (no appt availability in office) / [x] Appointment(In office/virtual)/ []  [facility]/ [] Home Care/ [] Refused Recommended Disposition /[] [facility]/ []  Follow-up with PCP Additional Notes: Patient calls stating she has had increasing R knee cap pain x 1 week. Denies injury, redness, swelling, CP, SOB. 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 States that she has taken OTC medications with minimal relief. Per protocol, patient to be evaluated within 3 days. Patient states she works until 1500, requesting appt with any provider after that time. Scheduled with alternate provider in clinic for today at 1600. Care advice reviewed, patient verbalized understanding. Alerting PCP for review.   Copied from CRM [id number]. Topic: Clinical - Red Word Triage >> May 23, 2023  9:45 AM Deleta HERO wrote: Red Word that prompted transfer to Nurse Triage: This patient is experiencing severe pain in her right knee, this has been lasting for about a week, the patient states she waited for the symptoms to subside, but the pain is getting worse and is shooting up towards her hip causing discomfort. Reason for Disposition  [1] MODERATE pain (e.g., interferes with normal activities, limping) AND [2] present > 3 days  Answer Assessment - Initial Assessment Questions 1. LOCATION and RADIATION: Where is the pain located?      R knee- reports mainly knee cap, no pain in back of knee 2. QUALITY: What does the pain feel like?  (e.g., sharp, dull, aching, burning)     Stabbing, relieved some with ibuprofen 3. SEVERITY: How bad is the pain? What does it keep you from doing?   (Scale 1-10; or mild, moderate, severe)   -  MILD (1-3): doesn't interfere with normal  activities    -  MODERATE (4-7): interferes with normal activities (e.g., work or school) or awakens from sleep, limping    -  SEVERE (8-10): excruciating pain, unable to do any normal activities, unable to walk     8/10 with ambulation, standing without movement it is less 4. ONSET: When did the pain start? Does it come and go, or is it there all the time?     A little over a week 5. RECURRENT: Have you had this pain before? If Yes, ask: When, and what happened then?     Denies 6. SETTING: Has there been any recent work, exercise or other activity that involved that part of the body?      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 AGGRAVATING FACTORS: What makes the knee pain worse? (e.g., walking, climbing stairs, running)     walking 8. ASSOCIATED SYMPTOMS: Is there any swelling or redness of the knee?     Denies 9. OTHER SYMPTOMS: Do you have any other symptoms? (e.g., chest pain, difficulty breathing, fever, calf pain)     Denies  Protocols used: Knee Pain-A-AH

## 2023-05-24 ENCOUNTER — Telehealth: Payer: Self-pay | Admitting: Nurse Practitioner

## 2023-05-24 NOTE — Telephone Encounter (Unsigned)
 Copied from CRM [id number]. Topic: Clinical - Medication Refill >> May 24, 2023  4:09 PM Elle L wrote: Most Recent Primary Care Visit:  Provider: Eliodoro Guerin  Department: [facility]  Visit Type: OFFICE VISIT  Date: 07/27/2022  Medication: The patient states that she was prescribed a cream at her appointment on apply Voltaren  gel 3 times daily as needed. [phone].  Has the patient contacted their pharmacy? {yes/no:20286} (Agent: If no, request that the patient contact the pharmacy for the refill. If patient does not wish to contact the pharmacy document the reason why and proceed with request.) (Agent: If yes, when and what did the pharmacy advise?)  Is this the correct pharmacy for this prescription? {yes/no:20286} If no, delete pharmacy and type the correct one.  This is the patient's preferred pharmacy:  [id number] [place], [place] - [address] [place] [address] Phone: [phone] Fax: [phone]  Has the prescription been filled recently? No  Is the patient out of the medication? Yes  Has the patient been seen for an appointment in the last year OR does the patient have an upcoming appointment? Yes  Can we respond through MyChart? Yes  Agent: Please be advised that Rx refills may take up to 3 business days. We ask that you follow-up with your pharmacy.

## 2023-06-02 ENCOUNTER — Other Ambulatory Visit: Payer: Self-pay | Admitting: Family Medicine

## 2023-06-05 ENCOUNTER — Other Ambulatory Visit: Payer: Self-pay | Admitting: Family Medicine

## 2023-06-05 NOTE — Telephone Encounter (Signed)
Copied from CRM [id number]. Topic: Clinical - Prescription Issue >> Jun 05, 2023  3:23 PM Dennison Nancy wrote: Reason for CRM:  ran out of her medication icosapent Ethyl (VASCEPA) 1 g capsule [facility] , the  icosapent Ethyl (VASCEPA) 1 g capsule , pharmacy stated the medication has to gp thru the insurance    [id number] [place], [place] - [address] [place], [place] [address] Phone: [phone]  Fax: [phone]

## 2023-06-05 NOTE — Telephone Encounter (Signed)
Please clarify what this means?

## 2023-06-06 ENCOUNTER — Other Ambulatory Visit (HOSPITAL_COMMUNITY): Payer: Self-pay

## 2023-06-07 NOTE — Telephone Encounter (Signed)
[facility], they sent a PA request for the Vascepa, is what this is meaning by 'going through the insurance'

## 2023-06-08 ENCOUNTER — Ambulatory Visit: Payer: Self-pay | Admitting: Family Medicine

## 2023-06-08 ENCOUNTER — Telehealth: Payer: Self-pay | Admitting: Family Medicine

## 2023-06-08 NOTE — Telephone Encounter (Signed)
Copied from CRM [id number]. Topic: Clinical - Medication Question >> Jun 08, 2023 10:25 AM Dondra Prader A wrote: Reason for CRM: Patient is calling back regarding the patient message that she received stating to utilize topical NSAIDs for her knee to alleviate discomfort. Patient is wanting to know if this will be prescribed for her or if she has to get something over the counter. Please advise.

## 2023-06-08 NOTE — Telephone Encounter (Addendum)
Pt states the Vascepa needs a PA, will route to Prior Auth team to work on.

## 2023-06-08 NOTE — Telephone Encounter (Signed)
Copied from CRM [id number]. Topic: Clinical - Prescription Issue >> Jun 08, 2023  3:28 PM Dennison Nancy wrote: Reason for CRM: [facility]  have issue doing the refill have something to do with her insurance  the icosapent Ethyl (VASCEPA) 1 g capsule medication is very expensive   Patient been out of the medication for a couple of weeks   [id number] [place], [place] - [address] [place] [address] Phone: [phone] Fax: [phone] Hours: Not open 24 hours

## 2023-06-09 ENCOUNTER — Other Ambulatory Visit (HOSPITAL_COMMUNITY): Payer: Self-pay

## 2023-06-09 ENCOUNTER — Telehealth: Payer: Self-pay

## 2023-06-09 NOTE — Telephone Encounter (Signed)
Pharmacy Patient Advocate Encounter   Received notification from Pt Calls Messages that prior authorization for  ICOSAPENT ETHYL 1 GRAM CAPSULE is required/requested.   Insurance verification completed.   The patient is insured through  [employer]  .   Per test claim: PA required; PA submitted to above mentioned insurance via Prompt PA Key/confirmation #/EOC [id number] Status is pending  [email or web address]

## 2023-06-09 NOTE — Telephone Encounter (Signed)
It is available over the counter. Voltaren gel or Diclofenac gel. Ok to use up to 4 times daily.

## 2023-06-09 NOTE — Telephone Encounter (Signed)
Called patient it went straight to voice mail which is full not able to leave a message

## 2023-06-09 NOTE — Telephone Encounter (Signed)
PA request has been Submitted. New Encounter created for follow up. For additional info see Pharmacy Prior Auth telephone encounter from 06/09/23.

## 2023-06-12 NOTE — Telephone Encounter (Signed)
Unable to lm on vm. 2nd attempt. LS

## 2023-06-13 NOTE — Telephone Encounter (Signed)
 Patient informed. LS

## 2023-06-14 NOTE — Telephone Encounter (Signed)
 Pharmacy Patient Advocate Encounter  Received notification from [employer] that Prior Authorization for Icosapent  Ethyl 1gm caps has been DENIED.  Full denial letter will be uploaded to the media tab. See denial reason below.   PA #/Case ID/Reference #: [id number]

## 2023-06-14 NOTE — Telephone Encounter (Signed)
 Unable to lm on vm mailbox is full. LS

## 2023-06-15 ENCOUNTER — Other Ambulatory Visit (HOSPITAL_COMMUNITY): Payer: Self-pay

## 2023-06-15 NOTE — Telephone Encounter (Signed)
 Unable to lm on vm mailbox is full. Sending MyChart message. LS

## 2023-06-19 ENCOUNTER — Telehealth: Payer: Self-pay | Admitting: Family Medicine

## 2023-06-19 NOTE — Telephone Encounter (Signed)
Copied from CRM [id number]. Topic: Clinical - Prescription Issue >> Jun 19, 2023  2:22 PM Carlatta H wrote: Reason for CRM: Patients Vascepa prescription was denied and she would like to know what could be prescribed in place of that medication//Please call patient

## 2023-06-20 ENCOUNTER — Ambulatory Visit: Payer: Self-pay | Admitting: Family Medicine

## 2023-06-20 NOTE — Telephone Encounter (Signed)
  Chief Complaint: hematuria Symptoms: lower abd cramping, blood in urine Frequency:  started this morning Pertinent Negatives: Patient denies fever, odorous urine, vaginal bleeding, rectal bleeding  Disposition: [] ED /[] Urgent Care (no appt availability in office) / [x] Appointment(In office/virtual)/ []  [facility]/ [] Home Care/ [] Refused Recommended Disposition /[] [employer] [facility]/ []  Follow-up with PCP  Additional Notes: Pt confirms that the blood is in the urine, not stool and not vaginal. Pt states she believes she has hx of UTIs when she was younger. Pt states that this all started this morning when she woke. Pt advised to utilize UC/ED if she develops fever, worsening pain, odor, burning, inability to empty bladder/pass urine. Pt agreeable and understands, sched for tomorrow with pcp clinic.   Copied from CRM [id number]. Topic: Clinical - Red Word Triage >> Jun 20, 2023 10:50 AM Gildardo Pounds wrote: Red Word that prompted transfer to Nurse Triage: blood in urine just noticed this morning, a little crampy. Callback number [phone] Answer Assessment - Initial Assessment Questions 1. SYMPTOM: "What's the main symptom you're concerned about?" (e.g., frequency, incontinence)     hematuria 2. ONSET: "When did the  blood  start?"     This morning 3. PAIN: "Is there any pain?" If Yes, ask: "How bad is it?" (Scale: 1-10; mild, moderate, severe)     Cramping, hardly there  4. CAUSE: "What do you think is causing the symptoms?"     Never had this before, has had UTIs maybe a long time.  5. OTHER SYMPTOMS: "Do you have any other symptoms?" (e.g., blood in urine, fever, flank pain, pain with urination)     Blood in urine, lower abd cramping,  Protocols used: Urinary Symptoms-A-AH

## 2023-06-21 ENCOUNTER — Encounter: Payer: Self-pay | Admitting: Family Medicine

## 2023-06-21 ENCOUNTER — Ambulatory Visit: Payer: BC Managed Care – PPO

## 2023-06-21 VITALS — BP 178/79 | HR 77 | Temp 96.6°F | Ht 62.0 in | Wt 163.4 lb

## 2023-06-21 DIAGNOSIS — I10 Essential (primary) hypertension: Secondary | ICD-10-CM | POA: Diagnosis not present

## 2023-06-21 DIAGNOSIS — R319 Hematuria, unspecified: Secondary | ICD-10-CM

## 2023-06-21 LAB — MICROSCOPIC EXAMINATION
RBC, Urine: >30 /[HPF] — AB (ref 0–2)
Renal Epithel, UA: NONE SEEN /[HPF]

## 2023-06-21 LAB — URINALYSIS, ROUTINE W REFLEX MICROSCOPIC
Bilirubin, UA: NEGATIVE
Glucose, UA: NEGATIVE
Ketones, UA: NEGATIVE
Nitrite, UA: NEGATIVE
Specific Gravity, UA: 1.025 (ref 1.005–1.030)
Urobilinogen, Ur: 0.2 mg/dL (ref 0.2–1.0)
pH, UA: 5.5 (ref 5.0–7.5)

## 2023-06-21 MED ORDER — FLUCONAZOLE 150 MG PO TABS: 150.0000 mg | ORAL_TABLET | Freq: Once | ORAL | 0 refills | Status: AC

## 2023-06-21 MED ORDER — SULFAMETHOXAZOLE-TRIMETHOPRIM 800-160 MG PO TABS: 1.0000 | ORAL_TABLET | Freq: Two times a day (BID) | ORAL | 0 refills | Status: DC

## 2023-06-21 NOTE — Progress Notes (Addendum)
 Subjective:  Patient ID: Ann Snyder, female    DOB: 1950-08-23, 73 y.o.   MRN: [id number]  Patient Care Team: Raliegh Ip, DO as PCP - General (Family Medicine) Danella Maiers, [facility] (Pharmacist)   Chief Complaint:  Hematuria (Patient states that she thinks she may have seen blood in her urine yesterday. ) and Dysuria (X 1 day)   HPI: Ann Snyder is a 73 y.o. female presenting on 06/21/2023 for Hematuria (Patient states that she thinks she may have seen blood in her urine yesterday. ) and Dysuria (X 1 day)  Hematuria Associated symptoms include dysuria.  Dysuria  Associated symptoms include hematuria.   1. Hematuria, unspecified type States that the past two weeks she has had intermittent burning. Noticed blood in her urine yesterday. States that she does not see it as much today, but has burning. Endorses slight itching. Denies odor, discharge, N/V, fever, low back pain. Endorses frequency and abdominal cramping.    2. Elevated BP  States that she did not take medications this morning. She works at [employer] and can check her BP there.   Relevant past medical, surgical, family, and social history reviewed and updated as indicated.  Allergies and medications reviewed and updated. Data reviewed: Chart in Epic.   Past Medical History:  Diagnosis Date   Anxiety    Depression    Essential hypertension 12/20/2016   Headache(784.0)    High cholesterol    OSA (obstructive sleep apnea) 10/07/2014   Moderate with AHI 20/hr    Past Surgical History:  Procedure Laterality Date   BREAST EXCISIONAL BIOPSY Left    2000   CESAREAN SECTION     3 times    Social History   Socioeconomic History   Marital status: Widowed    Spouse name: Not on file   Number of children: Not on file   Years of education: Not on file   Highest education level: Not on file  Occupational History   Not on file  Tobacco Use   Smoking status: Never   Smokeless tobacco: Never   Vaping Use   Vaping status: Never Used  Substance and Sexual Activity   Alcohol use: No   Drug use: No   Sexual activity: Not on file  Other Topics Concern   Not on file  Social History Narrative   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     Social Drivers of Corporate investment banker Strain: Not on file  Food Insecurity: Not on file  Transportation Needs: Not on file  Physical Activity: Not on file  Stress: Not on file  Social Connections: Unknown (09/17/2021)   Received from [facility]   Social Network    Social Network: Not on file  Intimate Partner Violence: Unknown (08/10/2021)   [facility], [facility]   HITS    Physically Hurt: Not on file    Insult or Talk Down To: Not on file    Threaten Physical Harm: Not on file    Scream or Curse: Not on file    Outpatient Encounter Medications as of 06/21/2023  Medication Sig   amLODipine (NORVASC) 5 MG tablet Take 1 tablet (5 mg total) by mouth daily.   buPROPion (WELLBUTRIN SR) 150 MG 12 hr tablet TAKE 1 TABLET BY MOUTH EVERY DAY IN THE MORNING   Cholecalciferol (VITAMIN D-3) 125 MCG (5000 UT) TABS Take 5,000 Units by mouth daily.   diclofenac (VOLTAREN) 75  MG EC tablet Take 1 tablet (75 mg total) by mouth 2 (two) times daily.   fenofibrate (TRICOR) 48 MG tablet Take 1 tablet (48 mg total) by mouth daily.   fenofibrate micronized (LOFIBRA) 67 MG capsule Take 67 mg by mouth daily.   lisinopril (ZESTRIL) 20 MG tablet Take 1 tablet (20 mg total) by mouth daily. **NEEDS TO BE SEEN BEFORE NEXT REFILL**   metFORMIN (GLUCOPHAGE-XR) 500 MG 24 hr tablet Take 500 mg by mouth daily with breakfast.   mirtazapine (REMERON) 30 MG tablet Take 1 tablet (30 mg total) by mouth at bedtime.   Multiple Vitamins-Minerals (PRESERVISION AREDS PO) Take 1 capsule by mouth daily.   vitamin B-12 (CYANOCOBALAMIN) 500 MCG tablet Take 500 mcg by mouth daily.   baclofen (LIORESAL) 10 MG tablet TAKE 0.5 TABLETS (5 MG TOTAL) BY  MOUTH 3 (THREE) TIMES DAILY. (Patient not taking: Reported on 06/21/2023)   icosapent Ethyl (VASCEPA) 1 g capsule TAKE 2 CAPSULES BY MOUTH 2 (TWO) TIMES DAILY. WITH OR FOLLOWING MEALS (Patient not taking: Reported on 06/21/2023)   levocetirizine (XYZAL) 5 MG tablet Take 1 tablet (5 mg total) by mouth at bedtime as needed for allergies. (Patient not taking: Reported on 06/21/2023)   Magnesium 125 MG CAPS Take by mouth. (Patient not taking: Reported on 06/21/2023)   mupirocin ointment (BACTROBAN) 2 % Apply 1 Application topically 2 (two) times daily. (Patient not taking: Reported on 06/21/2023)   polyethylene glycol powder (GLYCOLAX/MIRALAX) 17 GM/SCOOP powder Take 17 g by mouth 2 (two) times daily as needed. (Patient not taking: Reported on 06/21/2023)   psyllium (METAMUCIL) 58.6 % packet Take 1 packet by mouth daily. (Patient not taking: Reported on 06/21/2023)   No facility-administered encounter medications on file as of 06/21/2023.    No Known Allergies  Review of Systems  Genitourinary:  Positive for dysuria and hematuria.   Objective:  BP (!) 178/79   Pulse 77   Temp (!) 96.6 F (35.9 C)   Ht 5\' 2"  (1.575 m)   Wt 163 lb 6.4 oz (74.1 kg)   SpO2 98%   BMI 29.89 kg/m    Wt Readings from Last 3 Encounters:  06/21/23 163 lb 6.4 oz (74.1 kg)  05/23/23 165 lb 6.4 oz (75 kg)  07/27/22 165 lb (74.8 kg)   Physical Exam Constitutional:      General: She is awake. She is not in acute distress.    Appearance: Normal appearance. She is well-developed and well-groomed. She is not ill-appearing, toxic-appearing or diaphoretic.  Cardiovascular:     Rate and Rhythm: Normal rate.     Pulses: Normal pulses.          Radial pulses are 2+ on the right side and 2+ on the left side.       Posterior tibial pulses are 2+ on the right side and 2+ on the left side.     Heart sounds: Normal heart sounds. No murmur heard.    No gallop.  Pulmonary:     Effort: Pulmonary effort is normal. No respiratory  distress.     Breath sounds: Normal breath sounds. No stridor. No wheezing, rhonchi or rales.  Abdominal:     General: Abdomen is flat. Bowel sounds are normal. There is no distension.     Palpations: Abdomen is soft. There is no mass.     Tenderness: There is no abdominal tenderness. There is no right CVA tenderness, left CVA tenderness, guarding or rebound.     Hernia: No  hernia is present.  Musculoskeletal:     Cervical back: Full passive range of motion without pain and neck supple.     Right lower leg: No edema.     Left lower leg: No edema.  Skin:    General: Skin is warm.     Capillary Refill: Capillary refill takes less than 2 seconds.  Neurological:     General: No focal deficit present.     Mental Status: She is alert, oriented to person, place, and time and easily aroused. Mental status is at baseline.     GCS: GCS eye subscore is 4. GCS verbal subscore is 5. GCS motor subscore is 6.     Motor: No weakness.  Psychiatric:        Attention and Perception: Attention and perception normal.        Mood and Affect: Mood and affect normal.        Speech: Speech normal.        Behavior: Behavior normal. Behavior is cooperative.        Thought Content: Thought content normal. Thought content does not include homicidal or suicidal ideation. Thought content does not include homicidal or suicidal plan.        Cognition and Memory: Cognition and memory normal.        Judgment: Judgment normal.     Results for orders placed or performed in visit on 07/27/22  Bayer DCA Hb A1c Waived   Collection Time: 07/27/22 10:43 AM  Result Value Ref Range   HB A1C (BAYER DCA - WAIVED) 6.3 (H) 4.8 - 5.6 %       07/27/2022    9:59 AM 07/05/2022    2:25 PM 03/08/2022    1:21 PM 11/23/2021   11:43 AM 11/02/2021    2:48 PM  Depression screen PHQ 2/9  Decreased Interest 0 0 0 0 0  Down, Depressed, Hopeless 0 0 0 0 0  PHQ - 2 Score 0 0 0 0 0  Altered sleeping 0 0     Tired, decreased energy 0 0      Change in appetite 0 0     Feeling bad or failure about yourself  0 0     Trouble concentrating 0 0     Moving slowly or fidgety/restless 0 0     Suicidal thoughts 0 0     PHQ-9 Score 0 0     Difficult doing work/chores Not difficult at all Not difficult at all          07/27/2022    9:59 AM 03/08/2022    1:21 PM 08/24/2021   12:01 PM 04/23/2021    8:42 AM  GAD 7 : Generalized Anxiety Score  Nervous, Anxious, on Edge 0 0 0 0  Control/stop worrying 0 0 0 0  Worry too much - different things 0 0 0 0  Trouble relaxing 0 0 0 0  Restless 0 0 0 0  Easily annoyed or irritable 0 0 0 0  Afraid - awful might happen 0 0 0 0  Total GAD 7 Score 0 0 0 0  Anxiety Difficulty Not difficult at all Not difficult at all Not difficult at all Not difficult at all   Pertinent labs & imaging results that were available during my care of the patient were reviewed by me and considered in my medical decision making.  Assessment & Plan:  Everley was seen today for hematuria and dysuria.  Diagnoses and all orders for  this visit:  Hematuria, unspecified type Based on UA and micro, will treat with abx and diflucan as below and await results of culture. Discussed red flags with patient.  -     Urinalysis, Routine w reflex microscopic -     Urine Culture -     sulfamethoxazole-trimethoprim (BACTRIM DS) 800-160 MG tablet; Take 1 tablet by mouth 2 (two) times daily. - fluconazole (DIFLUCAN) 150 MG tablet; Take 1 tablet (150 mg total) by mouth once for 1 dose. May take second dose 72 hours after first dose if symptoms remain  Dispense: 2 tablet; Refill: 0  Essential hypertension Elevated BP in office this morning. Patient has access to BP monitor at home. Patient to take medications and report BP to office. Has follow up with PCP next week. Discussed red flags with patient.    Continue all other maintenance medications.  Follow up plan: Return if symptoms worsen or fail to improve.  Continue healthy  lifestyle choices, including diet (rich in fruits, vegetables, and lean proteins, and low in salt and simple carbohydrates) and exercise (at least 30 minutes of moderate physical activity daily).  Written and verbal instructions provided   The above assessment and management plan was discussed with the patient. The patient verbalized understanding of and has agreed to the management plan. Patient is aware to call the clinic if they develop any new symptoms or if symptoms persist or worsen. Patient is aware when to return to the clinic for a follow-up visit. Patient educated on when it is appropriate to go to the emergency department.   Neale Burly, DNP-FNP [facility] [address] [place], [place] [address] [phone]

## 2023-06-21 NOTE — Addendum Note (Signed)
Addended by: Neale Burly on: 06/21/2023 09:23 AM   Modules accepted: Orders

## 2023-06-23 LAB — URINE CULTURE

## 2023-06-26 ENCOUNTER — Encounter: Payer: Self-pay | Admitting: Family Medicine

## 2023-06-26 NOTE — Progress Notes (Signed)
 Negative for UTI on culture. Recommend patient follow up if symptoms continue.

## 2023-06-27 ENCOUNTER — Encounter: Payer: Self-pay | Admitting: Family Medicine

## 2023-06-27 ENCOUNTER — Ambulatory Visit: Payer: Medicare Other | Admitting: Family Medicine

## 2023-06-27 VITALS — BP 139/77 | HR 79 | Temp 97.9°F | Ht 62.0 in | Wt 163.0 lb

## 2023-06-27 DIAGNOSIS — E538 Deficiency of other specified B group vitamins: Secondary | ICD-10-CM

## 2023-06-27 DIAGNOSIS — E1169 Type 2 diabetes mellitus with other specified complication: Secondary | ICD-10-CM | POA: Diagnosis not present

## 2023-06-27 DIAGNOSIS — E785 Hyperlipidemia, unspecified: Secondary | ICD-10-CM

## 2023-06-27 DIAGNOSIS — M85851 Other specified disorders of bone density and structure, right thigh: Secondary | ICD-10-CM

## 2023-06-27 DIAGNOSIS — E559 Vitamin D deficiency, unspecified: Secondary | ICD-10-CM | POA: Diagnosis not present

## 2023-06-27 DIAGNOSIS — F418 Other specified anxiety disorders: Secondary | ICD-10-CM

## 2023-06-27 DIAGNOSIS — E1159 Type 2 diabetes mellitus with other circulatory complications: Secondary | ICD-10-CM | POA: Diagnosis not present

## 2023-06-27 DIAGNOSIS — Z7984 Long term (current) use of oral hypoglycemic drugs: Secondary | ICD-10-CM

## 2023-06-27 DIAGNOSIS — I152 Hypertension secondary to endocrine disorders: Secondary | ICD-10-CM

## 2023-06-27 DIAGNOSIS — E119 Type 2 diabetes mellitus without complications: Secondary | ICD-10-CM

## 2023-06-27 LAB — LIPID PANEL

## 2023-06-27 LAB — BAYER DCA HB A1C WAIVED: HB A1C (BAYER DCA - WAIVED): 6.6 % — ABNORMAL HIGH (ref 4.8–5.6)

## 2023-06-27 MED ORDER — BUPROPION HCL ER (SR) 150 MG PO TB12: ORAL_TABLET | ORAL | 3 refills | Status: DC

## 2023-06-27 MED ORDER — MIRTAZAPINE 30 MG PO TABS: 30.0000 mg | ORAL_TABLET | Freq: Every day | ORAL | 3 refills | Status: DC

## 2023-06-27 MED ORDER — AMLODIPINE BESYLATE 5 MG PO TABS: 5.0000 mg | ORAL_TABLET | Freq: Every day | ORAL | 3 refills | Status: DC

## 2023-06-27 MED ORDER — LISINOPRIL 20 MG PO TABS: 20.0000 mg | ORAL_TABLET | Freq: Every day | ORAL | 3 refills | Status: DC

## 2023-06-27 NOTE — Progress Notes (Signed)
 Subjective: CC: Hypertension, hyperlipidemia PCP: Raliegh Ip, DO GEX:[id number] Ann Snyder is a 73 y.o. female presenting to clinic today for:  1.  Hypertension associated with hyperlipidemia   Patient is compliant with Norvasc, fenofibrate 67 mg daily and lisinopril.  She reports that metformin was recently added by the nurse practitioner at work.  She is not sure if she was diabetic or prediabetic.  She admits that she has had some weight gain.  She tries to stay physically active but notes that her knee pain, particularly on the right side sometimes can be limiting.  She saw an orthopedist and Voltaren was prescribed.  She is using this once daily and this has pretty decent relief of the pain.  She reports no chest pain, shortness of breath, visual disturbance, polydipsia or polyuria   ROS: Per HPI  No Known Allergies Past Medical History:  Diagnosis Date   Anxiety    Depression    Essential hypertension 12/20/2016   Headache(784.0)    High cholesterol    OSA (obstructive sleep apnea) 10/07/2014   Moderate with AHI 20/hr    Current Outpatient Medications:    amLODipine (NORVASC) 5 MG tablet, Take 1 tablet (5 mg total) by mouth daily., Disp: 90 tablet, Rfl: 3   baclofen (LIORESAL) 10 MG tablet, TAKE 0.5 TABLETS (5 MG TOTAL) BY MOUTH 3 (THREE) TIMES DAILY. (Patient not taking: Reported on 06/21/2023), Disp: 30 tablet, Rfl: 3   buPROPion (WELLBUTRIN SR) 150 MG 12 hr tablet, TAKE 1 TABLET BY MOUTH EVERY DAY IN THE MORNING, Disp: 90 tablet, Rfl: 3   Cholecalciferol (VITAMIN D-3) 125 MCG (5000 UT) TABS, Take 5,000 Units by mouth daily., Disp: , Rfl:    diclofenac (VOLTAREN) 75 MG EC tablet, Take 1 tablet (75 mg total) by mouth 2 (two) times daily., Disp: 30 tablet, Rfl: 0   fenofibrate (TRICOR) 48 MG tablet, Take 1 tablet (48 mg total) by mouth daily., Disp: 90 tablet, Rfl: 3   fenofibrate micronized (LOFIBRA) 67 MG capsule, Take 67 mg by mouth daily., Disp: , Rfl:    icosapent  Ethyl (VASCEPA) 1 g capsule, TAKE 2 CAPSULES BY MOUTH 2 (TWO) TIMES DAILY. WITH OR FOLLOWING MEALS (Patient not taking: Reported on 06/21/2023), Disp: 120 capsule, Rfl: 0   levocetirizine (XYZAL) 5 MG tablet, Take 1 tablet (5 mg total) by mouth at bedtime as needed for allergies. (Patient not taking: Reported on 06/21/2023), Disp: 30 tablet, Rfl: PRN   lisinopril (ZESTRIL) 20 MG tablet, Take 1 tablet (20 mg total) by mouth daily. **NEEDS TO BE SEEN BEFORE NEXT REFILL**, Disp: 30 tablet, Rfl: 0   Magnesium 125 MG CAPS, Take by mouth. (Patient not taking: Reported on 06/21/2023), Disp: , Rfl:    metFORMIN (GLUCOPHAGE-XR) 500 MG 24 hr tablet, Take 500 mg by mouth daily with breakfast., Disp: , Rfl:    mirtazapine (REMERON) 30 MG tablet, Take 1 tablet (30 mg total) by mouth at bedtime., Disp: 90 tablet, Rfl: 3   Multiple Vitamins-Minerals (PRESERVISION AREDS PO), Take 1 capsule by mouth daily., Disp: , Rfl:    mupirocin ointment (BACTROBAN) 2 %, Apply 1 Application topically 2 (two) times daily. (Patient not taking: Reported on 06/21/2023), Disp: 22 g, Rfl: 0   polyethylene glycol powder (GLYCOLAX/MIRALAX) 17 GM/SCOOP powder, Take 17 g by mouth 2 (two) times daily as needed. (Patient not taking: Reported on 06/21/2023), Disp: 3350 g, Rfl: 1   psyllium (METAMUCIL) 58.6 % packet, Take 1 packet by mouth daily. (Patient not  taking: Reported on 06/21/2023), Disp: , Rfl:    sulfamethoxazole-trimethoprim (BACTRIM DS) 800-160 MG tablet, Take 1 tablet by mouth 2 (two) times daily., Disp: 6 tablet, Rfl: 0   vitamin B-12 (CYANOCOBALAMIN) 500 MCG tablet, Take 500 mcg by mouth daily., Disp: , Rfl:  Social History   Socioeconomic History   Marital status: Widowed    Spouse name: Not on file   Number of children: Not on file   Years of education: Not on file   Highest education level: Not on file  Occupational History   Not on file  Tobacco Use   Smoking status: Never   Smokeless tobacco: Never  Vaping Use   Vaping  status: Never Used  Substance and Sexual Activity   Alcohol use: No   Drug use: No   Sexual activity: Not on file  Other Topics Concern   Not on file  Social History Narrative   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     Social Drivers of Corporate investment banker Strain: Not on file  Food Insecurity: Not on file  Transportation Needs: Not on file  Physical Activity: Not on file  Stress: Not on file  Social Connections: Unknown (09/17/2021)   Received from [facility], [facility]   Social Network    Social Network: Not on file  Intimate Partner Violence: Unknown (08/10/2021)   Received from [facility], [facility]   HITS    Physically Hurt: Not on file    Insult or Talk Down To: Not on file    Threaten Physical Harm: Not on file    Scream or Curse: Not on file   Family History  Problem Relation Age of Onset   Diabetes Mother    Anxiety disorder Father    Heart disease Father        No details   Breast cancer Maternal Grandmother        in her 74s   ADD / ADHD Neg Hx    Alcohol abuse Neg Hx    Drug abuse Neg Hx    Bipolar disorder Neg Hx    Depression Neg Hx    Dementia Neg Hx    OCD Neg Hx    Paranoid behavior Neg Hx    Schizophrenia Neg Hx    Seizures Neg Hx    Physical abuse Neg Hx    Sexual abuse Neg Hx     Objective: Office vital signs reviewed. BP 139/77   Pulse 79   Temp 97.9 F (36.6 C)   Ht 5\' 2"  (1.575 m)   Wt 163 lb (73.9 kg)   SpO2 98%   BMI 29.81 kg/m   Physical Examination:  General: Awake, alert, well nourished, No acute distress HEENT:  sclera white, MMM Cardio: regular rate and rhythm, S1S2 heard, no murmurs appreciated Pulm: clear to auscultation bilaterally, no wheezes, rhonchi or rales; normal work of breathing on room air     07/27/2022    9:59 AM 07/05/2022    2:25 PM 03/08/2022    1:21 PM  Depression screen PHQ 2/9  Decreased Interest 0 0 0  Down, Depressed, Hopeless 0 0 0  PHQ - 2 Score 0 0 0  Altered sleeping 0 0    Tired, decreased energy 0 0   Change in appetite 0 0   Feeling bad or failure about yourself  0 0   Trouble concentrating 0 0   Moving slowly or fidgety/restless 0 0   Suicidal thoughts 0  0   PHQ-9 Score 0 0   Difficult doing work/chores Not difficult at all Not difficult at all       07/27/2022    9:59 AM 03/08/2022    1:21 PM 08/24/2021   12:01 PM 04/23/2021    8:42 AM  GAD 7 : Generalized Anxiety Score  Nervous, Anxious, on Edge 0 0 0 0  Control/stop worrying 0 0 0 0  Worry too much - different things 0 0 0 0  Trouble relaxing 0 0 0 0  Restless 0 0 0 0  Easily annoyed or irritable 0 0 0 0  Afraid - awful might happen 0 0 0 0  Total GAD 7 Score 0 0 0 0  Anxiety Difficulty Not difficult at all Not difficult at all Not difficult at all Not difficult at all    Assessment/ Plan: 73 y.o. female   Hypertension associated with diabetes (HCC) - Plan: CMP14+EGFR, lisinopril (ZESTRIL) 20 MG tablet, amLODipine (NORVASC) 5 MG tablet, CT CARDIAC SCORING (SELF PAY ONLY)  Diabetes mellitus treated with oral medication (HCC) - Plan: Bayer DCA Hb A1c Waived, CT CARDIAC SCORING (SELF PAY ONLY)  Hyperlipidemia associated with type 2 diabetes mellitus (HCC) - Plan: CMP14+EGFR, Lipid Panel, CT CARDIAC SCORING (SELF PAY ONLY)  Osteopenia of neck of right femur - Plan: CMP14+EGFR  Vitamin D deficiency - Plan: CMP14+EGFR, VITAMIN D 25 Hydroxy (Vit-D Deficiency, Fractures)  Vitamin B12 deficiency - Plan: CBC, Vitamin B12  Depression with anxiety - Plan: buPROPion (WELLBUTRIN SR) 150 MG 12 hr tablet, mirtazapine (REMERON) 30 MG tablet  Her A1c today is actually consistent with type 2 diabetes at 6.6.  I suspect it was probably even higher with her nurse practitioner at work and that is why they put her on the metformin.  I certainly would like to see her back sooner for interval follow-up and so that we can get her up-to-date on other preventative measures that are required for type II diabetics.   I did go ahead and order CT coronary artery calcium scoring.  She needs to be on a statin given type II diabetic state so I will discuss this with her again  She will get her calcium and vitamin D levels checked given known osteopenia and history of vitamin D deficiency.  I collected CBC and B12 as well given history of deficiency  Both her Wellbutrin and mirtazapine were renewed today and her depression and anxiety seem to be relatively stable   Raliegh Ip, DO [facility] [phone]

## 2023-06-28 ENCOUNTER — Ambulatory Visit: Payer: Self-pay | Admitting: Family Medicine

## 2023-06-28 LAB — CMP14+EGFR
ALT: 44 IU/L — ABNORMAL HIGH (ref 0–32)
AST: 31 IU/L (ref 0–40)
Albumin: 4.4 g/dL (ref 3.8–4.8)
Alkaline Phosphatase: 110 IU/L (ref 44–121)
BUN/Creatinine Ratio: 24 (ref 12–28)
BUN: 18 mg/dL (ref 8–27)
Bilirubin Total: 0.3 mg/dL (ref 0.0–1.2)
CO2: 22 mmol/L (ref 20–29)
Calcium: 9.7 mg/dL (ref 8.7–10.3)
Chloride: 105 mmol/L (ref 96–106)
Creatinine, Ser: 0.74 mg/dL (ref 0.57–1.00)
Globulin, Total: 2.7 g/dL (ref 1.5–4.5)
Glucose: 112 mg/dL — ABNORMAL HIGH (ref 70–99)
Potassium: 4.8 mmol/L (ref 3.5–5.2)
Sodium: 142 mmol/L (ref 134–144)
Total Protein: 7.1 g/dL (ref 6.0–8.5)
eGFR: 86 mL/min/{1.73_m2} (ref >59)

## 2023-06-28 LAB — CBC
Hematocrit: 40.5 % (ref 34.0–46.6)
Hemoglobin: 12.9 g/dL (ref 11.1–15.9)
MCH: 27.6 pg (ref 26.6–33.0)
MCHC: 31.9 g/dL (ref 31.5–35.7)
MCV: 87 fL (ref 79–97)
Platelets: 243 10*3/uL (ref 150–450)
RBC: 4.68 x10E6/uL (ref 3.77–5.28)
RDW: 13.4 % (ref 11.7–15.4)
WBC: 5 10*3/uL (ref 3.4–10.8)

## 2023-06-28 LAB — LIPID PANEL
Cholesterol, Total: 211 mg/dL — ABNORMAL HIGH (ref 100–199)
HDL: 38 mg/dL — ABNORMAL LOW (ref >39)
LDL CALC COMMENT:: 5.6 ratio — ABNORMAL HIGH (ref 0.0–4.4)
LDL Chol Calc (NIH): 129 mg/dL — ABNORMAL HIGH (ref 0–99)
Triglycerides: 247 mg/dL — ABNORMAL HIGH (ref 0–149)
VLDL Cholesterol Cal: 44 mg/dL — ABNORMAL HIGH (ref 5–40)

## 2023-06-28 LAB — VITAMIN B12: Vitamin B-12: 325 pg/mL (ref 232–1245)

## 2023-06-28 LAB — VITAMIN D 25 HYDROXY (VIT D DEFICIENCY, FRACTURES): Vit D, 25-Hydroxy: 49.2 ng/mL (ref 30.0–100.0)

## 2023-06-28 NOTE — Telephone Encounter (Signed)
  Chief Complaint: patient calling back for results Disposition: Follow up with PCP Additional Notes: Relayed message in patient's chart from Dr Nadine Counts: "Please inform patient that she in fact does have type 2 diabetes. I want her to continue the metformin as technically it is considered controlled diabetes at this level but I certainly would like to see her back in the next 4 months that we can complete other medical tasks for her diabetes which will include a urine microalbumin, foot exam. Please make sure that she gets a diabetic eye exam specifically with her eye doctor. She will need to request this and let them know that she has a new diagnosis of type 2 diabetes". Patient would like a follow up call to discuss her triglycerides level. Patient also asking if someone is supposed to call her for the CT Cardiac Scoring to have it scheduled. Patient scheduled for the 4 month follow up on 10/23/23 at 4pm with PCP. Patient states she will contact her eye doctor regarding the exam.   Copied from CRM [id number]. Topic: Clinical - Lab/Test Results >> Jun 28, 2023  9:29 AM Dennison Nancy wrote: Reason for CRM: Patient is calling back she received a message regarding her test results . Would like someone to go over the results .  Please contact patient [phone] Reason for Disposition  Caller requesting lab results  (Exception: Routine or non-urgent lab result.)  Answer Assessment - Initial Assessment Questions 1. REASON FOR CALL or QUESTION: "What is your reason for calling today?" or "How can I best help you?" or "What question do you have that I can help answer?"     Patient calling back regarding lab results. Relayed message from PCP to patient. Patient verbalizes understanding. Patient is also asking if her PCP could discuss her triglycerides level with her. She is asking if she should be expecting a call to schedule her CT Cardiac Scoring test.  2. CALLER: Document the source of call. (e.g.,  laboratory, patient).     Patient.  Protocols used: PCP Call - No Triage-A-AH

## 2023-06-28 NOTE — Telephone Encounter (Signed)
 Returned patients call and patient states she has no other questions.

## 2023-06-30 ENCOUNTER — Other Ambulatory Visit (HOSPITAL_COMMUNITY): Payer: Self-pay

## 2023-07-10 ENCOUNTER — Ambulatory Visit (HOSPITAL_COMMUNITY)
Admission: RE | Admit: 2023-07-10 | Discharge: 2023-07-10 | Disposition: A | Discharge status: Home / Self Care | Payer: Self-pay | Source: Ambulatory Visit | Attending: Family Medicine | Admitting: Family Medicine

## 2023-07-10 DIAGNOSIS — E1159 Type 2 diabetes mellitus with other circulatory complications: Secondary | ICD-10-CM | POA: Insufficient documentation

## 2023-07-10 DIAGNOSIS — Z7984 Long term (current) use of oral hypoglycemic drugs: Secondary | ICD-10-CM | POA: Insufficient documentation

## 2023-07-10 DIAGNOSIS — E119 Type 2 diabetes mellitus without complications: Secondary | ICD-10-CM | POA: Insufficient documentation

## 2023-07-10 DIAGNOSIS — I152 Hypertension secondary to endocrine disorders: Secondary | ICD-10-CM | POA: Insufficient documentation

## 2023-07-10 DIAGNOSIS — E785 Hyperlipidemia, unspecified: Secondary | ICD-10-CM | POA: Insufficient documentation

## 2023-07-10 DIAGNOSIS — E1169 Type 2 diabetes mellitus with other specified complication: Secondary | ICD-10-CM | POA: Insufficient documentation

## 2023-07-11 ENCOUNTER — Telehealth: Payer: Self-pay | Admitting: Family Medicine

## 2023-07-11 ENCOUNTER — Encounter: Payer: Self-pay | Admitting: Family Medicine

## 2023-07-11 DIAGNOSIS — R931 Abnormal findings on diagnostic imaging of heart and coronary circulation: Secondary | ICD-10-CM

## 2023-07-11 DIAGNOSIS — E1169 Type 2 diabetes mellitus with other specified complication: Secondary | ICD-10-CM

## 2023-07-11 MED ORDER — ROSUVASTATIN CALCIUM 10 MG PO TABS: 10.0000 mg | ORAL_TABLET | Freq: Every day | ORAL | 3 refills | Status: DC

## 2023-07-11 NOTE — Telephone Encounter (Signed)
 Referral placed.

## 2023-07-11 NOTE — Telephone Encounter (Signed)
 Copied from CRM [id number]. Topic: Referral - Request for Referral >> Jul 11, 2023  9:36 AM Kristie Cowman wrote: Did the patient discuss referral with their provider in the last year? Yes (If No - schedule appointment) (If Yes - send message)  Appointment offered? No  Type of order/referral and detailed reason for visit: Patient had a CT scan and in Dr. Delynn Flavin note it stated that the patient needed a referral to a cardiologist.    Preference of office, provider, location: None  If referral order, have you been seen by this specialty before? No  (If Yes, this issue or another issue?   Can we respond through MyChart? Yes

## 2023-07-11 NOTE — Addendum Note (Signed)
 Addended by: Raliegh Ip on: 07/11/2023 04:24 PM   Modules accepted: Orders

## 2023-07-11 NOTE — Telephone Encounter (Signed)
 Spoke with pt and she is agreeable to starting a statin

## 2023-07-11 NOTE — Telephone Encounter (Signed)
 Please Review

## 2023-07-20 ENCOUNTER — Other Ambulatory Visit (HOSPITAL_COMMUNITY): Payer: Self-pay

## 2023-07-20 NOTE — Progress Notes (Unsigned)
 Cardiology Office Note:    Date:  07/21/2023  ID:  Ann Snyder, DOB 05/11/1950, MRN [id number] PCP: Raliegh Ip, DO  [facility] Providers Cardiologist:  Rollene Rotunda, MD       Patient Profile:      Chief Complaint: Follow-up for abnormal CT cardiac scoring   History of Present Illness:  Ann Snyder is a 73 y.o. female with visit-pertinent history of coronary artery disease, hyperlipidemia, hypertension, type 2 diabetes, vitamin D deficiency, vitamin B12 deficiency, depression with anxiety  Patient established with cardiology service on 10/2020 for evaluation of chest pain.  She did have a negative POET in 2016.  During initial office visit she had noted 1 episode of jaw pain and denied any other cardiac symptoms.  Further testing was not indicated at that time and she was to follow-up as needed.  She had a CT cardiac scoring on 07/10/2023 that was ordered by her PCP showing a coronary calcium score of 229 (76th percentile).  She was referred back to cardiology for further evaluation.    Discussed the use of AI scribe software for clinical note transcription with the patient, who gave verbal consent to proceed.  The patient, with a history of hypertension, diabetes, and hyperlipidemia, presents with intermittent chest and jaw pains that have been occurring intermittently for several years.  She works as a Production assistant, radio at [employer].  She did note her husband died suddenly around 20 years ago, she did raise 3 children.  The chest pain is described as random, brief, and non-radiating, occurring at rest and not associated with exertion. The jaw pain is also described as intermittent and unrelated to the chest pain.  The pain typically last for several seconds and is located substernally.  The patient reports no other associated symptoms.  She denies any alleviating or aggravating symptoms.  The patient's blood pressure was noted to be high during the visit 170s-180s,  despite being on antihypertensive medication, she does note she did not take her blood pressure medicine prior to visit.  She is without any dyspnea, DOE, syncope, near syncope, palpitations, tachycardia, hematochezia, melena    Review of systems:  Please see the history of present illness. All other systems are reviewed and otherwise negative.     Home Medications:    Current Meds  Medication Sig   amLODipine (NORVASC) 5 MG tablet Take 1 tablet (5 mg total) by mouth daily.   aspirin EC 81 MG tablet Take 1 tablet (81 mg total) by mouth daily. Swallow whole.   buPROPion (WELLBUTRIN SR) 150 MG 12 hr tablet TAKE 1 TABLET BY MOUTH EVERY DAY IN THE MORNING   Cholecalciferol (VITAMIN D-3) 125 MCG (5000 UT) TABS Take 5,000 Units by mouth daily.   diclofenac (VOLTAREN) 75 MG EC tablet Take 1 tablet (75 mg total) by mouth 2 (two) times daily.   fenofibrate (TRICOR) 48 MG tablet Take 1 tablet (48 mg total) by mouth daily.   fluconazole (DIFLUCAN) 150 MG tablet Take by mouth.   lisinopril (ZESTRIL) 40 MG tablet Take 1 tablet (40 mg total) by mouth daily.   metFORMIN (GLUCOPHAGE-XR) 500 MG 24 hr tablet Take 500 mg by mouth daily with breakfast.   metoprolol tartrate (LOPRESSOR) 100 MG tablet Take 1 tablet (100 mg total) by mouth 2 (two) times daily. TAKE ONE TABLET 2 HOURS PRIOR TO CT SCAN.   mirtazapine (REMERON) 30 MG tablet Take 1 tablet (30 mg total) by mouth at bedtime.   Multiple Vitamins-Minerals (  PRESERVISION AREDS PO) Take 1 capsule by mouth daily.   rosuvastatin (CRESTOR) 10 MG tablet Take 1 tablet (10 mg total) by mouth daily. To replace Vascepa. Continue fenofibrate.   vitamin B-12 (CYANOCOBALAMIN) 500 MCG tablet Take 500 mcg by mouth daily.   [DISCONTINUED] lisinopril (ZESTRIL) 20 MG tablet Take 1 tablet (20 mg total) by mouth daily.   Studies Reviewed:   EKG Interpretation Date/Time:  Friday July 21 2023 08:46:27 EDT Ventricular Rate:  85 PR Interval:  162 QRS Duration:  86 QT  Interval:  362 QTC Calculation: 430 R Axis:   -8  Text Interpretation: Normal sinus rhythm Minimal voltage criteria for LVH, may be normal variant ( R in aVL ) Septal infarct , age undetermined No previous ECGs available Confirmed by Rise Paganini [phone]) on 07/21/2023 10:12:24 AM    CT cardiac scoring 07/10/2023 Coronary calcium score of 229 Agatston units. This was 76th percentile for age-, race-, and sex-matched controls. Risk Assessment/Calculations:     HYPERTENSION CONTROL Vitals:   07/21/23 0839 07/21/23 1014  BP: (!) 182/92 (!) 176/86    The patient's blood pressure is elevated above target today.  In order to address the patient's elevated BP: A current anti-hypertensive medication was adjusted today.          Physical Exam:   VS:  BP (!) 176/86 (BP Location: Left Arm, Patient Position: Sitting, Cuff Size: Normal)   Pulse 85   Ht 5\' 1"  (1.549 m)   Wt 163 lb 3.2 oz (74 kg)   SpO2 92%   BMI 30.84 kg/m    Wt Readings from Last 3 Encounters:  07/21/23 163 lb 3.2 oz (74 kg)  06/27/23 163 lb (73.9 kg)  06/21/23 163 lb 6.4 oz (74.1 kg)    GEN: Well nourished, well developed in no acute distress NECK: No JVD; No carotid bruits CARDIAC: RRR, no murmurs, rubs, gallops RESPIRATORY:  Clear to auscultation without rales, wheezing or rhonchi  ABDOMEN: Soft, non-tender, non-distended EXTREMITIES:  No edema; No acute deformity      Assessment and Plan:  Coronary artery disease / Chest pains CT cardiac scoring 07/10/2023 showing CAC of 229 (76 percentile) -Today the patient notes intermittent atypical chest pains that are nonradiating, lasting seconds at a time, occurring at rest and without exertion over the past several years.  Pain located substernally without any alleviating or aggravating symptoms noted.  She also notes intermittent jaw pain that has been unrelated to her chest pains that have been ongoing for several years -EKG today shows NSR without acute ischemic  changes and evidence of LVH -Given ongoing chest pains and recent elevated CAC score will order coronary CTA for further ischemic evaluation as well as echocardiogram to assess LV function and for any valvular abnormalities. -Start aspirin 81 mg EC daily -Ordered coronary CTA & echocardiogram as above.  Metoprolol 100 mg ordered to be given prior to coronary CTA -Continue rosuvastatin 10 mg daily, fenofibrate 48 mg daily -BMET today  Hyperlipidemia / Hypertriglyceridemia LDL 129, HDL 38, TG 247, TC 211 on 10/2128 LDL is not currently under good control and above goal of less than 70.  Of note she was just recently started on rosuvastatin approximately 2-3 weeks ago -Plan for repeat lipid panel in 6 weeks during follow-up visit.  Can increase statin therapy at that time if needed to reach goal -She notes she was previously on Vascepa however no longer covered by insurance -Continue rosuvastatin 10 mg daily and fenofibrate 48 mg daily  Hypertension Blood pressure today is uncontrolled and elevated at 182/92 and repeat 176/86 She does note that she did not take her blood pressure medication prior to visit today however through chart review patient's last several blood pressures over the past year in various offices are (139/77, 178/79, 150/82, and 157/81).  Her blood pressure does not seem to be under adequate control -Plan to increase lisinopril from 20 mg to 40 mg and continue amlodipine 5 mg daily -She will begin taking BP at home and alert office for BP that is consistently over her goal of 130/80 -If BP remains uncontrolled consider addition of thiazide diuretic or increasing her amlodipine to max dose dose  T2DM A1c 6.6 on 06/2023 Currently managed on metformin -Managed by PCP           Dispo:  Return in about 6 weeks (around 09/01/2023).  Signed, Denyce Robert, NP

## 2023-07-21 ENCOUNTER — Encounter: Payer: Self-pay | Admitting: Emergency Medicine

## 2023-07-21 ENCOUNTER — Ambulatory Visit: Payer: Self-pay | Attending: Emergency Medicine | Admitting: Emergency Medicine

## 2023-07-21 VITALS — BP 176/86 | HR 85 | Ht 61.0 in | Wt 163.2 lb

## 2023-07-21 DIAGNOSIS — I10 Essential (primary) hypertension: Secondary | ICD-10-CM

## 2023-07-21 DIAGNOSIS — R072 Precordial pain: Secondary | ICD-10-CM

## 2023-07-21 DIAGNOSIS — E785 Hyperlipidemia, unspecified: Secondary | ICD-10-CM

## 2023-07-21 DIAGNOSIS — E781 Pure hyperglyceridemia: Secondary | ICD-10-CM

## 2023-07-21 DIAGNOSIS — E119 Type 2 diabetes mellitus without complications: Secondary | ICD-10-CM

## 2023-07-21 DIAGNOSIS — I2510 Atherosclerotic heart disease of native coronary artery without angina pectoris: Secondary | ICD-10-CM

## 2023-07-21 LAB — BASIC METABOLIC PANEL
BUN/Creatinine Ratio: 23 (ref 12–28)
BUN: 16 mg/dL (ref 8–27)
CO2: 22 mmol/L (ref 20–29)
Calcium: 9.5 mg/dL (ref 8.7–10.3)
Chloride: 106 mmol/L (ref 96–106)
Creatinine, Ser: 0.7 mg/dL (ref 0.57–1.00)
Glucose: 127 mg/dL — ABNORMAL HIGH (ref 70–99)
Potassium: 4.6 mmol/L (ref 3.5–5.2)
Sodium: 143 mmol/L (ref 134–144)
eGFR: 92 mL/min/{1.73_m2} (ref >59)

## 2023-07-21 MED ORDER — LISINOPRIL 40 MG PO TABS: 40.0000 mg | ORAL_TABLET | Freq: Every day | ORAL | 1 refills | Status: DC

## 2023-07-21 MED ORDER — METOPROLOL TARTRATE 100 MG PO TABS: 100.0000 mg | ORAL_TABLET | Freq: Two times a day (BID) | ORAL | 0 refills | Status: DC

## 2023-07-21 MED ORDER — ASPIRIN 81 MG PO TBEC: 81.0000 mg | DELAYED_RELEASE_TABLET | Freq: Every day | ORAL | 1 refills | Status: DC

## 2023-07-21 NOTE — Patient Instructions (Addendum)
 Medication Instructions:  START ASPIRIN EC 81 MG DAILY. INCREASE LISINOPRIL TO 40 MG DAILY. TAKE METOPROLOL TARTRATE 100 MG 2 HOURS PRIOR TO CORONARY CTA.   Lab Work: BMET   Testing/Procedures: Your physician has requested that you have an ECHOCARDIOGRAM. Echocardiography is a painless test that uses sound waves to create images of your heart. It provides your doctor with information about the size and shape of your heart and how well your heart's chambers and valves are working. This procedure takes approximately one hour. There are no restrictions for this procedure. Please do NOT wear cologne, perfume, aftershave, or lotions (deodorant is allowed). Please arrive 15 minutes prior to your appointment time.  Please note: We ask at that you not bring children with you during ultrasound (echo/ vascular) testing. Due to room size and safety concerns, children are not allowed in the ultrasound rooms during exams. Our front office staff cannot provide observation of children in our lobby area while testing is being conducted. An adult accompanying a patient to their appointment will only be allowed in the ultrasound room at the discretion of the ultrasound technician under special circumstances. We apologize for any inconvenience.      Your cardiac CT will be scheduled at one of the below locations:   [facility] [address] [place], [place] [address] [phone]  If scheduled at [facility], please arrive at the [facility] and Children's Entrance (Entrance C2) of [facility] 30 minutes prior to test start time. You can use the FREE valet parking offered at entrance C (encouraged to control the heart rate for the test)  Proceed to the [facility] Radiology Department (first floor) to check-in and test prep.  All radiology patients and guests should use entrance C2 at [facility], accessed from [facility], even though the hospital's physical  address listed is [address]     Please follow these instructions carefully (unless otherwise directed):  An IV will be required for this test and Nitroglycerin will be given.  Hold all erectile dysfunction medications at least 3 days (72 hrs) prior to test. (Ie viagra, cialis, sildenafil, tadalafil, etc)   On the Night Before the Test: Be sure to Drink plenty of water. Do not consume any caffeinated/decaffeinated beverages or chocolate 12 hours prior to your test. Do not take any antihistamines 12 hours prior to your test. If the patient has contrast allergy: Patient will need a prescription for Prednisone and very clear instructions (as follows): Prednisone 50 mg - take 13 hours prior to test Take another Prednisone 50 mg 7 hours prior to test Take another Prednisone 50 mg 1 hour prior to test Take Benadryl 50 mg 1 hour prior to test Patient must complete all four doses of above prophylactic medications. Patient will need a ride after test due to Benadryl.  On the Day of the Test: Drink plenty of water until 1 hour prior to the test. Do not eat any food 1 hour prior to test. You may take your regular medications prior to the test.  Take metoprolol (Lopressor) two hours prior to test. If you take Furosemide/Hydrochlorothiazide/Spironolactone/Chlorthalidone, please HOLD on the morning of the test. Patients who wear a continuous glucose monitor MUST remove the device prior to scanning. FEMALES- please wear underwire-free bra if available, avoid dresses & tight clothing       After the Test: Drink plenty of water. After receiving IV contrast, you may experience a mild flushed feeling.  This is normal. On occasion, you may experience a mild rash up to 24 hours after the test. This is not dangerous. If this occurs, you can take Benadryl 25 mg, Zyrtec, Claritin, or Allegra and increase your fluid intake. (Patients taking Tikosyn should avoid Benadryl, and may take Zyrtec,  Claritin, or Allegra) If you experience trouble breathing, this can be serious. If it is severe call 911 IMMEDIATELY. If it is mild, please call our office.  We will call to schedule your test 2-4 weeks out understanding that some insurance companies will need an authorization prior to the service being performed.   For more information and frequently asked questions, please visit our website : [email or web address]  For non-scheduling related questions, please contact the cardiac imaging nurse navigator should you have any questions/concerns: Cardiac Imaging Nurse Navigators Direct Office Dial: [phone]   For scheduling needs, including cancellations and rescheduling, please call [place], [phone].   Follow-Up: At [facility], you and your health needs are our priority.  As part of our continuing mission to provide you with exceptional heart care, we have created designated Provider Care Teams.  These Care Teams include your primary Cardiologist (physician) and Advanced Practice Providers (APPs -  Physician Assistants and Nurse Practitioners) who all work together to provide you with the care you need, when you need it.   Your next appointment:   4-6 WEEKS POST TESTING  Provider:   Rise Paganini, DNP  Other Instructions:

## 2023-07-26 LAB — HM DIABETES EYE EXAM

## 2023-07-28 ENCOUNTER — Encounter: Payer: Self-pay | Admitting: Gastroenterology

## 2023-07-31 ENCOUNTER — Other Ambulatory Visit (HOSPITAL_COMMUNITY): Payer: Self-pay

## 2023-08-01 ENCOUNTER — Other Ambulatory Visit (HOSPITAL_COMMUNITY): Payer: Self-pay

## 2023-08-02 ENCOUNTER — Other Ambulatory Visit (HOSPITAL_COMMUNITY): Payer: Self-pay

## 2023-08-03 ENCOUNTER — Telehealth (HOSPITAL_COMMUNITY): Payer: Self-pay | Admitting: *Deleted

## 2023-08-03 ENCOUNTER — Encounter (HOSPITAL_COMMUNITY): Payer: Self-pay

## 2023-08-03 NOTE — Telephone Encounter (Signed)
 Attempted to call patient regarding upcoming cardiac CT appointment. Left message on voicemail with name and callback number  Larey Brick RN Navigator Cardiac Imaging [facility] [phone] Office [phone] Cell

## 2023-08-04 ENCOUNTER — Ambulatory Visit (HOSPITAL_COMMUNITY)
Admission: RE | Admit: 2023-08-04 | Discharge: 2023-08-04 | Disposition: A | Discharge status: Home / Self Care | Source: Ambulatory Visit | Attending: Emergency Medicine | Admitting: Emergency Medicine

## 2023-08-04 DIAGNOSIS — R072 Precordial pain: Secondary | ICD-10-CM | POA: Insufficient documentation

## 2023-08-04 MED ORDER — NITROGLYCERIN 0.4 MG SL SUBL
0.8000 mg | SUBLINGUAL_TABLET | Freq: Once | SUBLINGUAL | Status: AC
  Administered 2023-08-04: 0.8 mg via SUBLINGUAL

## 2023-08-04 MED ORDER — NITROGLYCERIN 0.4 MG SL SUBL
SUBLINGUAL_TABLET | SUBLINGUAL | Status: AC
  Filled 2023-08-04: qty 2

## 2023-08-04 MED ORDER — DILTIAZEM HCL 25 MG/5ML IV SOLN: 10.0000 mg | INTRAVENOUS | Status: DC | PRN

## 2023-08-04 MED ORDER — IOHEXOL 350 MG/ML SOLN
95.0000 mL | Freq: Once | INTRAVENOUS | Status: AC | PRN
  Administered 2023-08-04: 95 mL via INTRAVENOUS

## 2023-08-04 MED ORDER — METOPROLOL TARTRATE 5 MG/5ML IV SOLN: 10.0000 mg | Freq: Once | INTRAVENOUS | Status: DC | PRN

## 2023-08-19 ENCOUNTER — Other Ambulatory Visit: Payer: Self-pay | Admitting: Nurse Practitioner

## 2023-08-19 ENCOUNTER — Other Ambulatory Visit: Payer: Self-pay | Admitting: Family Medicine

## 2023-08-19 DIAGNOSIS — M546 Pain in thoracic spine: Secondary | ICD-10-CM

## 2023-08-19 DIAGNOSIS — E781 Pure hyperglyceridemia: Secondary | ICD-10-CM

## 2023-08-19 DIAGNOSIS — M25561 Pain in right knee: Secondary | ICD-10-CM

## 2023-08-23 ENCOUNTER — Other Ambulatory Visit: Payer: Self-pay | Admitting: Family Medicine

## 2023-08-25 ENCOUNTER — Ambulatory Visit (HOSPITAL_COMMUNITY)
Admission: RE | Admit: 2023-08-25 | Discharge: 2023-08-25 | Disposition: A | Discharge status: Home / Self Care | Source: Ambulatory Visit | Attending: Emergency Medicine | Admitting: Emergency Medicine

## 2023-08-25 DIAGNOSIS — R072 Precordial pain: Secondary | ICD-10-CM | POA: Insufficient documentation

## 2023-08-26 LAB — ECHOCARDIOGRAM COMPLETE
AR max vel: 2.46 cm2
AV Area VTI: 2.35 cm2
AV Area mean vel: 2.43 cm2
AV Mean grad: 3 mmHg
AV Peak grad: 6 mmHg
Ao pk vel: 1.22 m/s
Area-P 1/2: 3.12 cm2
MV M vel: 1.11 m/s
MV Peak grad: 4.9 mmHg
MV VTI: 2.43 cm2
S' Lateral: 2.38 cm

## 2023-09-14 ENCOUNTER — Encounter: Payer: Self-pay | Admitting: Emergency Medicine

## 2023-09-14 ENCOUNTER — Ambulatory Visit: Attending: Emergency Medicine | Admitting: Emergency Medicine

## 2023-09-14 VITALS — BP 168/86 | HR 92 | Ht 62.0 in | Wt 161.4 lb

## 2023-09-14 DIAGNOSIS — I2510 Atherosclerotic heart disease of native coronary artery without angina pectoris: Secondary | ICD-10-CM | POA: Diagnosis not present

## 2023-09-14 DIAGNOSIS — E785 Hyperlipidemia, unspecified: Secondary | ICD-10-CM

## 2023-09-14 DIAGNOSIS — E119 Type 2 diabetes mellitus without complications: Secondary | ICD-10-CM | POA: Diagnosis not present

## 2023-09-14 DIAGNOSIS — I10 Essential (primary) hypertension: Secondary | ICD-10-CM

## 2023-09-14 LAB — COMPREHENSIVE METABOLIC PANEL WITH GFR
ALT: 38 IU/L — ABNORMAL HIGH (ref 0–32)
AST: 30 IU/L (ref 0–40)
Albumin: 4.5 g/dL (ref 3.8–4.8)
Alkaline Phosphatase: 97 IU/L (ref 44–121)
BUN/Creatinine Ratio: 26 (ref 12–28)
BUN: 17 mg/dL (ref 8–27)
Bilirubin Total: 0.2 mg/dL (ref 0.0–1.2)
CO2: 20 mmol/L (ref 20–29)
Calcium: 9.4 mg/dL (ref 8.7–10.3)
Chloride: 106 mmol/L (ref 96–106)
Creatinine, Ser: 0.66 mg/dL (ref 0.57–1.00)
Globulin, Total: 2.3 g/dL (ref 1.5–4.5)
Glucose: 116 mg/dL — ABNORMAL HIGH (ref 70–99)
Potassium: 4.7 mmol/L (ref 3.5–5.2)
Sodium: 142 mmol/L (ref 134–144)
Total Protein: 6.8 g/dL (ref 6.0–8.5)
eGFR: 94 mL/min/{1.73_m2} (ref >59)

## 2023-09-14 LAB — LIPID PANEL
Chol/HDL Ratio: 3.2 ratio (ref 0.0–4.4)
Cholesterol, Total: 118 mg/dL (ref 100–199)
HDL: 37 mg/dL — ABNORMAL LOW (ref >39)
LDL Chol Calc (NIH): 53 mg/dL (ref 0–99)
Triglycerides: 165 mg/dL — ABNORMAL HIGH (ref 0–149)
VLDL Cholesterol Cal: 28 mg/dL (ref 5–40)

## 2023-09-14 NOTE — Progress Notes (Signed)
 Cardiology Office Note:    Date:  09/14/2023  ID:  Ann Snyder, DOB 01-26-1951, MRN [id number] PCP: Eliodoro Guerin, DO   HeartCare Providers Cardiologist:  Eilleen Grates, MD       Patient Profile:      Chief Complaint: Follow-up for chest pains History of Present Illness:  Ann Snyder is a 73 y.o. female with visit-pertinent history of coronary artery disease, hyperlipidemia, hypertension, type 2 diabetes, vitamin D  deficiency, vitamin B12 deficiency, depression with anxiety   Patient established with cardiology service on 10/2020 for evaluation of chest pain.  She did have a negative POET in 2016.  During initial office visit she had noted 1 episode of jaw pain and denied any other cardiac symptoms.  Further testing was not indicated at that time and she was to follow-up as needed.   She had a CT cardiac scoring on 07/10/2023 that was ordered by her PCP showing a coronary calcium  score of 229 (76th percentile).  She was referred back to cardiology for further evaluation.  Patient was last seen in clinic on 07/21/2023.  She presented to clinic with complaints of chest pain occurring intermittently for several years.  The pain was described as random, brief, and nonradiating.  She also reported intermittent jaw pain that was unrelated to the chest pain.  Her blood pressure was noted to be elevated at 182/92.  Her lisinopril  was increased from 20 mg to 40 mg daily she was continued on amlodipine  5 mg daily.  Coronary CTA was ordered and completed on 08/04/2023 showing nonobstructive CAD with three-vessel coronary calcium  score 274 (84th percentile).  Echocardiogram was completed on 08/25/2023 showing LVEF 65 to 70%, no RWMA, grade 1 DD, function and size normal trivial MR.   Discussed the use of AI scribe software for clinical note transcription with the patient, who gave verbal consent to proceed.  History of Present Illness Today patient arrives to clinic by herself.  She  is without any further acute cardiovascular concerns or complaints today.  She continues to work at [employer].  She notes that work keeps her very busy.  She notes that she is on her feet most of the day which she finds physically demanding.  She works 4 days a week and feels exhausted by the end of her shifts.    She continues to experience daily chest pain that is not extremely painful and occurs without a specific pattern. The pain is localized to the chest area and is not associated with exertion or specific activities.  She notes the chest pains are random and last just a second at a time occurring several times a day.  No palpitations, shortness of breath, heart racing, or fluttering sensations are present.  Her blood pressure today was 180/86 mmHg before medication.  Notes that she was fasting this morning just in case she needed blood work completed.  She takes lisinopril  40 mg and amlodipine  5 mg daily, usually after breakfast. She does not check her blood pressure regularly.  In the distant past her husband passed away after experiencing chest pain, which contributes to her concern about her own symptoms which leads to her anxiety and stress.  Review of systems:  Please see the history of present illness. All other systems are reviewed and otherwise negative.     Home Medications:    Current Meds  Medication Sig   amLODipine  (NORVASC ) 5 MG tablet Take 1 tablet (5 mg total) by mouth daily.   aspirin   EC 81 MG tablet Take 1 tablet (81 mg total) by mouth daily. Swallow whole.   buPROPion  (WELLBUTRIN  SR) 150 MG 12 hr tablet TAKE 1 TABLET BY MOUTH EVERY DAY IN THE MORNING   Cholecalciferol (VITAMIN D -3) 125 MCG (5000 UT) TABS Take 5,000 Units by mouth daily.   fenofibrate  (TRICOR ) 48 MG tablet TAKE 1 TABLET BY MOUTH EVERY DAY   lisinopril  (ZESTRIL ) 40 MG tablet Take 1 tablet (40 mg total) by mouth daily.   metFORMIN (GLUCOPHAGE-XR) 500 MG 24 hr tablet TAKE 1 TABLET BY MOUTH DAILY WITH  EVENING MEAL   mirtazapine  (REMERON ) 30 MG tablet Take 1 tablet (30 mg total) by mouth at bedtime.   rosuvastatin  (CRESTOR ) 10 MG tablet Take 1 tablet (10 mg total) by mouth daily. To replace Vascepa . Continue fenofibrate .         Studies Reviewed:   Echocardiogram 08/25/2023 1. Left ventricular ejection fraction, by estimation, is 65 to 70%. The  left ventricle has normal function. The left ventricle has no regional  wall motion abnormalities. Left ventricular diastolic parameters are  consistent with Grade I diastolic  dysfunction (impaired relaxation). The average left ventricular global  longitudinal strain is -18.9 %. The global longitudinal strain is normal.   2. Right ventricular systolic function is normal. The right ventricular  size is normal. Tricuspid regurgitation signal is inadequate for assessing  PA pressure.   3. The mitral valve is normal in structure. Trivial mitral valve  regurgitation. No evidence of mitral stenosis.   4. The aortic valve is grossly normal. Aortic valve regurgitation is not  visualized. No aortic stenosis is present.   5. The inferior vena cava is normal in size with greater than 50%  respiratory variability, suggesting right atrial pressure of 3 mmHg.   Coronary CTA 08/04/2023 1. 3 vessel calcium  with score 274 which is 84 th percentile for age/sex   2.  Normal ascending thoracic aorta diameter 3.3 cm   3.  Lipomatous hypertrophy of atrial septum   4.  CAD RADS 2 non obstructive CAD see description above Risk Assessment/Calculations:     HYPERTENSION CONTROL Vitals:   09/14/23 0857 09/14/23 1332  BP: (!) 180/86 (!) 168/86    The patient's blood pressure is elevated above target today.  In order to address the patient's elevated BP: Blood pressure will be monitored at home to determine if medication changes need to be made.          Physical Exam:   VS:  BP (!) 168/86 (BP Location: Right Arm, Patient Position: Sitting, Cuff Size:  Normal)   Pulse 92   Ht 5\' 2"  (1.575 m)   Wt 161 lb 6.4 oz (73.2 kg)   SpO2 98%   BMI 29.52 kg/m    Wt Readings from Last 3 Encounters:  09/14/23 161 lb 6.4 oz (73.2 kg)  07/21/23 163 lb 3.2 oz (74 kg)  06/27/23 163 lb (73.9 kg)    GEN: Well nourished, well developed in no acute distress NECK: No JVD; No carotid bruits CARDIAC: RRR, no murmurs, rubs, gallops RESPIRATORY:  Clear to auscultation without rales, wheezing or rhonchi  ABDOMEN: Soft, non-tender, non-distended EXTREMITIES:  No edema; No acute deformity     Assessment and Plan:  Coronary artery disease Coronary CTA 08/04/2023 showing nonobstructive CAD with coronary calcium  score 274 (84th percentile) Echocardiogram 08/2023 with LVEF 65 to 70% - Today patient continues with daily intermittent sharp chest pains that occur several times a day that have been ongoing  for a few years.  Notes chest pains are random and not associated with any specific activity.  Denies any exertional angina. - Given her recent coronary CTA showing nonobstructive CAD and description of her chest pain is being very atypical, her CP is likely noncardiac in origin   - For now we will continue with aggressive secondary prevention - Continue aspirin  81 mg daily and rosuvastatin  10 mg daily  Hyperlipidemia LDL 129, HDL 38, TG 247, TC 211 on 05/6107 Was started on rosuvastatin  in 06/2023 Given her history of CAD, her LDL goal will be set to less than 70 - Repeat fasting lipid panel today.  Will increase rosuvastatin  as needed to get to goal - Continue rosuvastatin  10 mg daily  Hypertension Blood pressure today is 180/86 and repeat 168/86 Blood pressure is currently uncontrolled - Patient notes she has not taken her medications yet this morning and she is not currently taking blood pressure at home - I will have patient begin taking her blood pressure once daily and log it for 2 weeks and send it to me for review - If blood pressure remains elevated we  will likely transition lisinopril  to valsartan at that time - Continue lisinopril  40 mg daily and amlodipine  5 mg daily  T2DM A1c 6.6 on 06/2023 - Currently managed on metformin by primary care provider      Dispo:  Return in about 6 months (around 03/16/2024).  Signed, Ava Boatman, NP

## 2023-09-14 NOTE — Patient Instructions (Addendum)
 Medication Instructions:  NO CHANGES, CONTINUE WITH YOUR CURRENT MEDICATION THERAPY. CHECK YOUR BLOOD PRESSURE DAILY FOR 2 WEEKS AND SEND TO [facility] VIA MYCHART.   Lab Work: CMET AND FASTING LIPID PANEL TO BE DONE TODAY.   Testing/Procedures: NONE  Follow-Up: At [facility], you and your health needs are our priority.  As part of our continuing mission to provide you with exceptional heart care, our providers are all part of one team.  This team includes your primary Cardiologist (physician) and Advanced Practice Providers or APPs (Physician Assistants and Nurse Practitioners) who all work together to provide you with the care you need, when you need it.  Your next appointment:   6 month(s)  Provider:   Eilleen Grates, MD OR Palmer Bobo, DNP

## 2023-09-19 ENCOUNTER — Ambulatory Visit: Payer: Self-pay | Admitting: Emergency Medicine

## 2023-09-28 ENCOUNTER — Other Ambulatory Visit: Payer: Self-pay | Admitting: Family Medicine

## 2023-09-28 DIAGNOSIS — M546 Pain in thoracic spine: Secondary | ICD-10-CM

## 2023-10-23 ENCOUNTER — Ambulatory Visit: Payer: BC Managed Care – PPO | Admitting: Family Medicine

## 2023-10-23 ENCOUNTER — Encounter: Payer: Self-pay | Admitting: Family Medicine

## 2023-10-23 VITALS — BP 135/70 | HR 79 | Temp 98.4°F | Ht 62.0 in | Wt 161.0 lb

## 2023-10-23 DIAGNOSIS — E1169 Type 2 diabetes mellitus with other specified complication: Secondary | ICD-10-CM

## 2023-10-23 DIAGNOSIS — E1159 Type 2 diabetes mellitus with other circulatory complications: Secondary | ICD-10-CM | POA: Diagnosis not present

## 2023-10-23 DIAGNOSIS — I152 Hypertension secondary to endocrine disorders: Secondary | ICD-10-CM

## 2023-10-23 DIAGNOSIS — E119 Type 2 diabetes mellitus without complications: Secondary | ICD-10-CM | POA: Diagnosis not present

## 2023-10-23 DIAGNOSIS — E785 Hyperlipidemia, unspecified: Secondary | ICD-10-CM

## 2023-10-23 DIAGNOSIS — D1809 Hemangioma of other sites: Secondary | ICD-10-CM | POA: Diagnosis not present

## 2023-10-23 DIAGNOSIS — Z7984 Long term (current) use of oral hypoglycemic drugs: Secondary | ICD-10-CM

## 2023-10-23 DIAGNOSIS — Z1211 Encounter for screening for malignant neoplasm of colon: Secondary | ICD-10-CM

## 2023-10-23 DIAGNOSIS — M25511 Pain in right shoulder: Secondary | ICD-10-CM

## 2023-10-23 NOTE — Patient Instructions (Addendum)
 A1c 6.6. Mail Cologuard back as soon as possible Ok to schedule mammogram. If you decide you'd like to try Rybelsus, let me know and we can replace the Metformin with it.  Cherry Angioma A cherry angioma, also called a Clayborne Cunning spot, is a harmless growth on the skin. It is made up of blood vessels. Cherry angiomas can appear anywhere on the body, but they usually appear on the trunk and arms. What are the causes? The cause of this condition is not known, but it seems to be related to advancing age. What increases the risk? You are more likely to develop this condition if: You are over the age of 50. You have a family member with this condition. What are the signs or symptoms? Symptoms of this condition include harmless growths that are: Smooth, round, and red or purplish-red. As small as the tip of a pin or as big as a pencil eraser. How is this diagnosed? This condition is diagnosed with a skin exam. Rarely, a piece of the cherry angioma may be removed for testing if it is not clear that the growth is a cherry angioma. How is this treated? Treatment is not needed for this condition. If you do not like the way a cherry angioma looks, you may have it removed. Removal methods include: A method where heat is used to burn the cherry angioma off the skin (electrocautery). A method where the cherry angioma is frozen (cryosurgery). This causes it to eventually fall off the skin. A method where a laser is used to destroy the red blood cells and blood vessels in the angioma (laser therapy). A minor surgical procedure. A scalpel is used to remove the cherry angioma off the skin. A cherry angioma may come back after it has been removed. Follow these instructions at home: If you have a cherry angioma removed, keep the area clean and follow any other care instructions as told by your health care provider. Take over-the-counter and prescription medicines only as told by your health care  provider. Keep all follow-up visits. This is important. Summary A cherry angioma is a harmless growth on the skin that is made up of blood vessels. Treatment is not needed for this condition. If you do not like the way a cherry angioma looks, you may have it removed. If you have a cherry angioma removed, follow any care instructions as told by your health care provider. This information is not intended to replace advice given to you by your health care provider. Make sure you discuss any questions you have with your health care provider. Document Revised: 01/14/2021 Document Reviewed: 01/14/2021 Elsevier Patient Education  2024 [employer].

## 2023-10-23 NOTE — Progress Notes (Addendum)
 Subjective: CC:DM PCP: Eliodoro Guerin, DO [id number] Ann Snyder is a 73 y.o. female presenting to clinic today for:  1. Type 2 Diabetes with hypertension, hyperlipidemia:  She is compliant with medications.  Diabetes Health Maintenance Due  Topic Date Due   FOOT EXAM  Never done   HEMOGLOBIN A1C  12/25/2023   OPHTHALMOLOGY EXAM  07/25/2024    Last A1c:  Lab Results  Component Value Date   HGBA1C 6.6 (H) 06/27/2023    ROS: No pain, shortness of breath.  Has had an appointment with her eye doctor recently and was told that she has cataracts they are not yet right but planning for sometime at the end of the year to do her surgery.  She has seen Dr. Abbey Abbe for nail care.  Told unlikely that the fungal infection of the right great toenail will have resolved.  She has her nails trimmed  2.  Right shoulder pain She is worried about maybe a breast issue given the right shoulder pain.  She had mammography done in March 2024 which was negative.  She reports no breast concerns specifically but just wants them checked for peace of mind  3.  Skin lesions Reports multiple skin lesions that she would like me to take a look at.  1 on the left cheek, several along the trunk and abdomen   ROS: Per HPI  No Known Allergies Past Medical History:  Diagnosis Date   Anxiety    Depression    Essential hypertension 12/20/2016   Headache(784.0)    High cholesterol    OSA (obstructive sleep apnea) 10/07/2014   Moderate with AHI 20/hr    Current Outpatient Medications:    amLODipine  (NORVASC ) 5 MG tablet, Take 1 tablet (5 mg total) by mouth daily., Disp: 100 tablet, Rfl: 3   aspirin  EC 81 MG tablet, Take 1 tablet (81 mg total) by mouth daily. Swallow whole., Disp: 90 tablet, Rfl: 1   buPROPion  (WELLBUTRIN  SR) 150 MG 12 hr tablet, TAKE 1 TABLET BY MOUTH EVERY DAY IN THE MORNING, Disp: 100 tablet, Rfl: 3   Cholecalciferol (VITAMIN D -3) 125 MCG (5000 UT) TABS, Take 5,000 Units by mouth  daily., Disp: , Rfl:    fenofibrate  (TRICOR ) 48 MG tablet, TAKE 1 TABLET BY MOUTH EVERY DAY, Disp: 90 tablet, Rfl: 0   lisinopril  (ZESTRIL ) 40 MG tablet, Take 1 tablet (40 mg total) by mouth daily., Disp: 90 tablet, Rfl: 1   metFORMIN (GLUCOPHAGE-XR) 500 MG 24 hr tablet, TAKE 1 TABLET BY MOUTH DAILY WITH EVENING MEAL, Disp: 90 tablet, Rfl: 4   mirtazapine  (REMERON ) 30 MG tablet, Take 1 tablet (30 mg total) by mouth at bedtime., Disp: 100 tablet, Rfl: 3   rosuvastatin  (CRESTOR ) 10 MG tablet, Take 1 tablet (10 mg total) by mouth daily. To replace Vascepa . Continue fenofibrate ., Disp: 90 tablet, Rfl: 3   metoprolol  tartrate (LOPRESSOR ) 100 MG tablet, Take 1 tablet (100 mg total) by mouth 2 (two) times daily. TAKE ONE TABLET 2 HOURS PRIOR TO CT SCAN. 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     Social Drivers of Corporate investment banker Strain: Not on file  Food Insecurity: Not on file  Transportation Needs: Not on file  Physical Activity: Not on file  Stress: Not on file  Social Connections: Unknown (09/17/2021)   Received from [facility]   Social Network    Social Network: Not on file  Intimate Partner Violence: Unknown (08/10/2021)   Received from [facility]   HITS    Physically Hurt: Not on file    Insult or Talk Down To: Not on file    Threaten Physical Harm: Not on file    Scream or Curse: Not on file   Family  History  Problem Relation Age of Onset   Diabetes Mother    Anxiety disorder Father    Heart disease Father        No details   Breast cancer Maternal Grandmother        in her 64s   ADD / ADHD Neg Hx    Alcohol abuse Neg Hx    Drug abuse Neg Hx    Bipolar disorder Neg Hx    Depression Neg Hx    Dementia Neg Hx    OCD Neg Hx    Paranoid behavior Neg Hx    Schizophrenia Neg Hx    Seizures Neg Hx    Physical abuse Neg Hx    Sexual abuse Neg Hx     Objective: Office vital signs reviewed. BP 135/70   Pulse 79   Temp 98.4 F (36.9 C)   Ht 5' 2 (1.575 m)   Wt 161 lb (73 kg)   SpO2 94%   BMI 29.45 kg/m   Physical Examination:  General: Awake, alert, well nourished, No acute distress HEENT: sclera white, MMM Cardio: regular rate and rhythm, S1S2 heard, no murmurs appreciated Pulm: clear to auscultation bilaterally, no wheezes, rhonchi or rales; normal work of breathing on room air MSK: No tenderness palpation to the shoulder.  No palpable abnormalities.  Negative Hawkins.  Negative empty can. Breast: Normal appearing in seated position.  No puckering, no changes to the skin.  No lymphadenopathy in the axillary region or supraclavicular region.  She has no dominant breast masses and no nipple discharge. Skin: Several cherry hemangiomas along the chest and abdomen.  Assessment/ Plan: 74 y.o. female   Diabetes mellitus treated with oral medication (HCC) - Plan: Bayer DCA Hb A1c Waived, Microalbumin / creatinine urine ratio  Hypertension associated with diabetes (HCC)  Hyperlipidemia associated with type 2 diabetes mellitus (HCC)  Hemangioma of chest wall  Screening for colon cancer - Plan: Cologuard  Acute pain of right shoulder  Check A1c, urine microalbumin.  Foot exam performed.  Blood pressure controlled upon recheck.  Discussed benign nature of hemangiomas  Cologuard ordered for colon cancer screening.  No apparent contraindications to utilization of this  modality of screening  Suspect shoulder tendinopathy. RICE.  Eliodoro Guerin, DO [facility] [phone]

## 2023-10-24 LAB — BAYER DCA HB A1C WAIVED: HB A1C (BAYER DCA - WAIVED): 6.6 % — ABNORMAL HIGH (ref 4.8–5.6)

## 2023-10-24 LAB — MICROALBUMIN / CREATININE URINE RATIO
Creatinine, Urine: 30.9 mg/dL
Microalb/Creat Ratio: 21 mg/g{creat} (ref 0–29)
Microalbumin, Urine: 6.5 ug/mL

## 2023-10-25 ENCOUNTER — Ambulatory Visit: Payer: Self-pay | Admitting: Family Medicine

## 2023-10-25 DIAGNOSIS — E119 Type 2 diabetes mellitus without complications: Secondary | ICD-10-CM

## 2023-10-31 ENCOUNTER — Other Ambulatory Visit: Payer: Self-pay | Admitting: Family Medicine

## 2023-10-31 DIAGNOSIS — Z1231 Encounter for screening mammogram for malignant neoplasm of breast: Secondary | ICD-10-CM

## 2023-11-01 ENCOUNTER — Ambulatory Visit
Admission: RE | Admit: 2023-11-01 | Discharge: 2023-11-01 | Disposition: A | Discharge status: Home / Self Care | Source: Ambulatory Visit | Attending: Family Medicine | Admitting: Family Medicine

## 2023-11-01 DIAGNOSIS — Z1231 Encounter for screening mammogram for malignant neoplasm of breast: Secondary | ICD-10-CM

## 2023-11-13 MED ORDER — RYBELSUS 3 MG PO TABS: 3.0000 mg | ORAL_TABLET | Freq: Every day | ORAL | 0 refills | Status: AC

## 2023-11-13 MED ORDER — RYBELSUS 7 MG PO TABS: 7.0000 mg | ORAL_TABLET | Freq: Every day | ORAL | 3 refills | Status: DC

## 2023-11-28 ENCOUNTER — Other Ambulatory Visit: Payer: Self-pay | Admitting: Family Medicine

## 2023-11-28 ENCOUNTER — Ambulatory Visit: Payer: Self-pay

## 2023-11-28 DIAGNOSIS — E781 Pure hyperglyceridemia: Secondary | ICD-10-CM

## 2023-11-28 NOTE — Telephone Encounter (Signed)
Noted appt scheduled already

## 2023-11-28 NOTE — Telephone Encounter (Signed)
 FYI Only or Action Required?: FYI only for provider.  Patient was last seen in primary care on 10/23/2023 by Jolinda Norene HERO, DO.  Called Nurse Triage reporting Knee Pain.  Symptoms began several months ago.  Interventions attempted: OTC medications: tylenol.  Symptoms are: gradually worsening.  Triage Disposition: See PCP Within 2 Weeks  Patient/caregiver understands and will follow disposition?: Yes       Copied from CRM #[id number]. Topic: Clinical - Red Word Triage >> Nov 28, 2023  1:50 PM Elle L wrote: Red Word that prompted transfer to Nurse Triage: The patient states her knee cap has been causing her severe pain. Reason for Disposition  [1] MILD pain (e.g., does not interfere with normal activities) AND [2] present > 7 days  Answer Assessment - Initial Assessment Questions 1. LOCATION and RADIATION: Where is the pain located?      Right knee cap 2. QUALITY: What does the pain feel like?  (e.g., sharp, dull, aching, burning)     aching 3. SEVERITY: How bad is the pain? What does it keep you from doing?   (Scale 1-10; or mild, moderate, severe)     8/10 4. ONSET: When did the pain start? Does it come and go, or is it there all the time?     Comes and goes, has had problems in the past but is getting worse 5. RECURRENT: Have you had this pain before? If Yes, ask: When, and what happened then?     Yes been ongoing for a while 6. SETTING: Has there been any recent work, exercise or other activity that involved that part of the body?      no 7. AGGRAVATING FACTORS: What makes the knee pain worse? (e.g., walking, climbing stairs, running)     Walking a lot 8. ASSOCIATED SYMPTOMS: Is there any swelling or redness of the knee?     no 9. OTHER SYMPTOMS: Do you have any other symptoms? (e.g., calf pain, chest pain, difficulty breathing, fever)     no  Protocols used: Knee Pain-A-AH

## 2023-11-29 ENCOUNTER — Ambulatory Visit: Admitting: Nurse Practitioner

## 2023-11-29 ENCOUNTER — Ambulatory Visit (INDEPENDENT_AMBULATORY_CARE_PROVIDER_SITE_OTHER)

## 2023-11-29 ENCOUNTER — Encounter: Payer: Self-pay | Admitting: Nurse Practitioner

## 2023-11-29 VITALS — BP 121/66 | HR 86 | Temp 97.8°F | Ht 62.0 in | Wt 160.0 lb

## 2023-11-29 DIAGNOSIS — M25561 Pain in right knee: Secondary | ICD-10-CM | POA: Diagnosis not present

## 2023-11-29 MED ORDER — MELOXICAM 7.5 MG PO TABS: 7.5000 mg | ORAL_TABLET | Freq: Every day | ORAL | 0 refills | Status: DC

## 2023-11-29 NOTE — Progress Notes (Signed)
 Acute Office Visit  Subjective:     Patient ID: Ann Snyder, Ann    DOB: 04-20-1951, 73 y.o.   MRN: [id number]  Chief Complaint  Patient presents with   Knee Pain    Right knee pain for couple weeks. No known injury    HPI Ann Snyder 73 year old Ann present July 2020 2025 for an acute visit concern for knee pain for a few weeks. Work as Production assistant, radio at [employer] and normal on feet most of the days Knee Pain: Patient complains of right knee pain. This is evaluated as a personal injury. The pain began 2 weeks ago. The pain is located medial.  She describes the symptoms as stabbing. Symptoms improve with OTC tylenol. The symptoms are worse with walking and standing for long period of time. The knee does not have given out or felt unstable. The patient can bend and straighten the knee fully.  The patient is active in does a lot walking at work. Treatment to date has been Tylenol, without significant relief.  Declines knee injection  Active Ambulatory Problems    Diagnosis Date Noted   Depression with anxiety 11/08/2011   Osteopenia 05/29/2013   Moderate obstructive sleep apnea 10/07/2014   Hyperlipidemia associated with type 2 diabetes mellitus (HCC) 01/21/2015   Knee pain 06/29/2015   Skin nodule 12/14/2015   Hypertension associated with diabetes (HCC) 12/20/2016   Vitamin B12 deficiency 10/21/2020   Diabetes mellitus treated with oral medication (HCC) 10/23/2023   Resolved Ambulatory Problems    Diagnosis Date Noted   MDD (major depressive disorder), recurrent episode, moderate (HCC) 06/25/2012   Hyperlipidemia 05/29/2013   Metabolic syndrome 05/29/2013   Chest pain 07/31/2014   Snoring 07/31/2014   Elevated blood pressure reading without diagnosis of hypertension 01/20/2015   Healthcare maintenance 01/20/2015   Obsessive thinking 05/30/2019   Prediabetes 10/21/2020   Jaw pain 11/01/2020   Acute bilateral thoracic back pain 05/23/2023   Past Medical  History:  Diagnosis Date   Anxiety    Depression    Essential hypertension 12/20/2016   Headache(784.0)    High cholesterol    OSA (obstructive sleep apnea) 10/07/2014    Review of Systems  Constitutional:  Negative for chills and fever.  HENT:  Negative for hearing loss and sore throat.   Respiratory:  Negative for cough, shortness of breath and wheezing.   Cardiovascular:  Negative for chest pain and leg swelling.  Gastrointestinal:  Negative for blood in stool, constipation, nausea and vomiting.  Musculoskeletal:  Positive for joint pain.       Right knee  Skin:  Negative for itching and rash.  Neurological:  Negative for dizziness and headaches.   Negative unless indicated in HPI    Objective:    BP 121/66   Pulse 86   Temp 97.8 F (36.6 C) (Temporal)   Ht 5' 2 (1.575 m)   Wt 160 lb (72.6 kg)   SpO2 95%   BMI 29.26 kg/m  BP Readings from Last 3 Encounters:  11/29/23 121/66  10/23/23 135/70  09/14/23 (!) 168/86   Wt Readings from Last 3 Encounters:  11/29/23 160 lb (72.6 kg)  10/23/23 161 lb (73 kg)  09/14/23 161 lb 6.4 oz (73.2 kg)      Physical Exam Vitals and nursing note reviewed.  Constitutional:      General: She is not in acute distress. HENT:     Head: Normocephalic and atraumatic.     Nose: Nose normal.  Eyes:     General: No scleral icterus.    Extraocular Movements: Extraocular movements intact.     Conjunctiva/sclera: Conjunctivae normal.     Pupils: Pupils are equal, round, and reactive to light.  Cardiovascular:     Heart sounds: Normal heart sounds.  Pulmonary:     Effort: Pulmonary effort is normal.     Breath sounds: Normal breath sounds.  Musculoskeletal:     Right knee: Bony tenderness present. Normal range of motion. No tenderness. No PCL laxity.     Instability Tests: Anterior drawer test negative.     Left knee: Normal.  Skin:    General: Skin is warm and dry.     Findings: No rash.  Neurological:     Mental Status: She  is alert and oriented to person, place, and time.  Psychiatric:        Mood and Affect: Mood normal.        Behavior: Behavior normal.        Judgment: Judgment normal.    Pertinent labs & imaging results that were available during my care of the patient were reviewed by me and considered in my medical decision making.  No results found for any visits on 11/29/23.      Assessment & Plan:  Acute pain of right knee -     DG Knee 1-2 Views Right -     Meloxicam ; Take 1 tablet (7.5 mg total) by mouth daily.  Dispense: 30 tablet; Refill: 0   Caleigh is a 73 year old Caucasian Ann seen today for right knee pain, no acute distress. Meloxicam  7.5 mg 1 tablet daily; x-ray ordered awaiting final report from radiologist -Advised client to go to any local pharmacy and get a knee sleeve for support - Heat 10-15 inclement PRN   The above assessment and management plan was discussed with the patient. The patient verbalized understanding of and has agreed to the management plan. Patient is aware to call the clinic if they develop any new symptoms or if symptoms persist or worsen. Patient is aware when to return to the clinic for a follow-up visit. Patient educated on when it is appropriate to go to the emergency department.  Return if symptoms worsen or fail to improve.  Tyner Codner [place] Thompson, DNP [facility] [address] [place], [place] [address] [phone]  Note: This document was prepared by [employer] voice dictation technology and any errors that results from this process are unintentional.

## 2023-11-30 ENCOUNTER — Ambulatory Visit: Payer: Self-pay | Admitting: Nurse Practitioner

## 2023-12-03 LAB — COLOGUARD: COLOGUARD: NEGATIVE

## 2023-12-19 ENCOUNTER — Other Ambulatory Visit: Payer: Self-pay | Admitting: Family Medicine

## 2023-12-19 DIAGNOSIS — E119 Type 2 diabetes mellitus without complications: Secondary | ICD-10-CM

## 2023-12-26 ENCOUNTER — Other Ambulatory Visit: Payer: Self-pay | Admitting: Nurse Practitioner

## 2023-12-26 DIAGNOSIS — M25561 Pain in right knee: Secondary | ICD-10-CM

## 2024-01-01 NOTE — Telephone Encounter (Signed)
 Patient would like to request & lower dose of medication due to s/s. Please call   Copied from CRM #[id number]. Topic: Clinical - Medical Advice >> Jan 01, 2024 10:54 AM Ann Snyder wrote: Reason for CRM: Patient is taking Semaglutide  (RYBELSUS ) 7 MG TABS.  She previously was taking 3 mg and don't know why it was increased to 7 mg.  She is having diarrhea, no appetite and nausea.  Please return her call at [phone] because she would like to go back to the 3 mg.  Thanks.

## 2024-01-02 ENCOUNTER — Other Ambulatory Visit: Payer: Self-pay | Admitting: Family Medicine

## 2024-01-02 ENCOUNTER — Ambulatory Visit: Payer: Self-pay

## 2024-01-02 DIAGNOSIS — E119 Type 2 diabetes mellitus without complications: Secondary | ICD-10-CM

## 2024-01-02 MED ORDER — RYBELSUS 3 MG PO TABS: 3.0000 mg | ORAL_TABLET | Freq: Every day | ORAL | 3 refills | Status: DC

## 2024-01-02 NOTE — Telephone Encounter (Signed)
 Lower dose sent of 3 mg of the Rybelsus  however if she is not able to tolerate this medication and/or has insufficiently controlled blood sugars with it, we may need to consider discontinuing and switching off to something else

## 2024-01-02 NOTE — Telephone Encounter (Signed)
 FYI Only or Action Required?: Action required by provider: clinical question for provider.  Patient was last seen in primary care on 11/29/2023 by [name], NP.  Called Nurse Triage reporting Diarrhea, Nausea, and Medication Problem.  Symptoms began a week ago.  Interventions attempted: Other: held semaglutide  for past 2 days.  Symptoms are: diarrhea (Saturday, resolved), nausea, loss/ decrease of appetite stable.  Triage Disposition: Call PCP When Office is Open  Patient/caregiver understands and will follow disposition?: Yes           Copied from CRM #[id number]. Topic: Clinical - Red Word Triage >> Jan 02, 2024 10:53 AM Harlene ORN wrote: Red Word that prompted transfer to Nurse Triage: PCP prescribed rybelsus  3 mg then it was increased to 7 mg took it for 7 days but is having a bad reaction to the 7 mg nausea and diarrhea, loss of appetite Reason for Disposition  [1] Caller has NON-URGENT medicine question about med that PCP prescribed AND [2] triager unable to answer question  Answer Assessment - Initial Assessment Questions 1. NAME of MEDICINE: What medicine(s) are you calling about?     Semaglutide  (Rybelsus ) 7mg   2. QUESTION: What is your question? (e.g., double dose of medicine, side effect)     Patient states she took the new, increased dosage of 7mg  for 7 days.She states due to side effects she has held the medication for the past 2 days. She would like to know if she can go back down to 3mg  dosage, which she states she tolerated well for a month.  3. PRESCRIBER: Who prescribed the medicine? Reason: if prescribed by specialist, call should be referred to that group.     Dr. Jolinda  4. SYMPTOMS: Do you have any symptoms? If Yes, ask: What symptoms are you having?  How bad are the symptoms (e.g., mild, moderate, severe)     Nausea, decreased appetite, diarrhea (on Saturday, has resolved). She states she has almost no appetite on the new  dosage. She states it has been difficult to go into work due to the symptoms. Patient states she tries to stay hydrated drinking water and ginger ale.   5. PREGNANCY:  Is there any chance that you are pregnant? When was your last menstrual period?     N/A.  Protocols used: Medication Question Call-A-AH

## 2024-01-02 NOTE — Telephone Encounter (Signed)
 Patient aware and verbalizes understanding.

## 2024-01-04 ENCOUNTER — Other Ambulatory Visit (HOSPITAL_COMMUNITY): Payer: Self-pay

## 2024-01-04 ENCOUNTER — Telehealth: Payer: Self-pay

## 2024-01-04 NOTE — Telephone Encounter (Signed)
 Duplicate message

## 2024-01-04 NOTE — Telephone Encounter (Signed)
 Copied from CRM [id number]. Topic: Clinical - Medication Prior Auth >> Jan 04, 2024  3:32 PM Carlatta H wrote: Reason for CRM: Please call to advise patient once medication is authorized

## 2024-01-04 NOTE — Telephone Encounter (Signed)
 Copied from CRM #[id number]. Topic: Clinical - Prescription Issue >> Jan 04, 2024  3:28 PM DeAngela L wrote: Reason for CRM: [employer] insurance medical management department calling to ask the provider could send a Prior Auth to stop the 7 mg patient says she can't take the is too strong and the insurance company is calling to as if the provider could send the Prior Auth for 3 mg so the patient can go back to the 3 mg per the patients request   Semaglutide  (RYBELSUS )  Phone [phone]

## 2024-01-04 NOTE — Telephone Encounter (Signed)
 Patient will be contacted when PA is completed

## 2024-01-04 NOTE — Telephone Encounter (Signed)
 Copied from CRM [id number]. Topic: Clinical - Medication Prior Auth >> Jan 04, 2024 11:07 AM Avram MATSU wrote: Reason for CRM: patient stated the provider sent in a new rx for Semaglutide  (RYBELSUS ) 3 MG TABS [[id number]] but the pharmacy can't fill it because its too soon. Its needing a PA

## 2024-01-05 ENCOUNTER — Other Ambulatory Visit (HOSPITAL_COMMUNITY): Payer: Self-pay

## 2024-01-05 NOTE — Telephone Encounter (Signed)
 Pharmacy will have to call patient's insurance for a dose change override to allow early fill. PA is not needed.

## 2024-01-05 NOTE — Telephone Encounter (Signed)
 The provider has since changed it to 3mg 

## 2024-01-09 NOTE — Telephone Encounter (Signed)
Left detailed message per dpr  

## 2024-01-10 ENCOUNTER — Other Ambulatory Visit: Payer: Self-pay | Admitting: Emergency Medicine

## 2024-02-20 ENCOUNTER — Other Ambulatory Visit: Payer: Self-pay | Admitting: Family Medicine

## 2024-02-20 DIAGNOSIS — M25561 Pain in right knee: Secondary | ICD-10-CM

## 2024-02-28 ENCOUNTER — Ambulatory Visit: Admitting: Family Medicine

## 2024-02-28 ENCOUNTER — Other Ambulatory Visit (HOSPITAL_COMMUNITY)
Admission: RE | Admit: 2024-02-28 | Discharge: 2024-02-28 | Disposition: A | Source: Ambulatory Visit | Attending: Family Medicine | Admitting: Family Medicine

## 2024-02-28 ENCOUNTER — Telehealth: Payer: Self-pay

## 2024-02-28 ENCOUNTER — Encounter: Payer: Self-pay | Admitting: Family Medicine

## 2024-02-28 VITALS — BP 134/72 | HR 89 | Temp 98.0°F | Ht 62.0 in | Wt 159.1 lb

## 2024-02-28 DIAGNOSIS — L989 Disorder of the skin and subcutaneous tissue, unspecified: Secondary | ICD-10-CM

## 2024-02-28 DIAGNOSIS — R300 Dysuria: Secondary | ICD-10-CM

## 2024-02-28 DIAGNOSIS — N939 Abnormal uterine and vaginal bleeding, unspecified: Secondary | ICD-10-CM

## 2024-02-28 DIAGNOSIS — N898 Other specified noninflammatory disorders of vagina: Secondary | ICD-10-CM

## 2024-02-28 MED ORDER — FLUCONAZOLE 150 MG PO TABS: 150.0000 mg | ORAL_TABLET | Freq: Once | ORAL | 0 refills | Status: AC

## 2024-02-28 NOTE — Telephone Encounter (Signed)
 Left message for patient to call back, offering patient 3:20 slot on DOD schedule.

## 2024-02-28 NOTE — Progress Notes (Signed)
 Subjective: CC: Dysuria PCP: Jolinda Ann HERO, DO YEP:Ann Snyder is a 73 y.o. female presenting to clinic today for:  Dysuria Patient reports that she has been having some intermittent dysuria with scant bleeding and vaginal itching over the last several weeks.  This has happened before and she had some type of infection that was treated.  She is here to make sure she does not have a UTI today.  She is postmenopausal.  Not sexually active.  No fevers, chills.  No flank pain reported.  No vaginal discharge reported  Bump on head She reports a spot on the scalp that has been present for the last couple of months.  She thought maybe it was due to sunburn but it really has not resolved.  She reports that it crusts and then she scraped it off and then it returns.  No personal history of skin cancers   ROS: Per HPI  No Known Allergies Past Medical History:  Diagnosis Date   Anxiety    Depression    Essential hypertension 12/20/2016   Headache(784.0)    High cholesterol    OSA (obstructive sleep apnea) 10/07/2014   Moderate with AHI 20/hr    Current Outpatient Medications:    amLODipine  (NORVASC ) 5 MG tablet, Take 1 tablet (5 mg total) by mouth daily., Disp: 100 tablet, Rfl: 3   aspirin  EC (ASPIRIN  LOW DOSE) 81 MG tablet, TAKE 1 TABLET (81 MG TOTAL) BY MOUTH DAILY. SWALLOW WHOLE., Disp: 90 tablet, Rfl: 3   buPROPion  (WELLBUTRIN  SR) 150 MG 12 hr tablet, TAKE 1 TABLET BY MOUTH EVERY DAY IN THE MORNING, Disp: 100 tablet, Rfl: 3   Cholecalciferol (VITAMIN D -3) 125 MCG (5000 UT) TABS, Take 5,000 Units by mouth daily., Disp: , Rfl:    fenofibrate  (TRICOR ) 48 MG tablet, TAKE 1 TABLET BY MOUTH EVERY DAY, Disp: 90 tablet, Rfl: 1   fluconazole  (DIFLUCAN ) 150 MG tablet, Take 1 tablet (150 mg total) by mouth once for 1 dose., Disp: 1 tablet, Rfl: 0   lisinopril  (ZESTRIL ) 40 MG tablet, TAKE 1 TABLET BY MOUTH EVERY DAY, Disp: 90 tablet, Rfl: 3   meloxicam  (MOBIC ) 7.5 MG tablet, Take 1  tablet (7.5 mg total) by mouth daily as needed for pain., Disp: 30 tablet, Rfl: 1   mirtazapine  (REMERON ) 30 MG tablet, Take 1 tablet (30 mg total) by mouth at bedtime., Disp: 100 tablet, Rfl: 3   rosuvastatin  (CRESTOR ) 10 MG tablet, Take 1 tablet (10 mg total) by mouth daily. To replace Vascepa . Continue fenofibrate ., Disp: 90 tablet, Rfl: 3   Semaglutide  (RYBELSUS ) 3 MG TABS, Take 1 tablet (3 mg total) by mouth daily., Disp: 90 tablet, Rfl: 3   UNABLE TO FIND, Take by mouth daily. Med Name: screen shield eye promise, Disp: , Rfl:    metFORMIN (GLUCOPHAGE-XR) 500 MG 24 hr tablet, TAKE 1 TABLET BY MOUTH DAILY WITH EVENING MEAL (Patient not taking: Reported on 02/28/2024), Disp: 90 tablet, Rfl: 4   metoprolol  tartrate (LOPRESSOR ) 100 MG tablet, Take 1 tablet (100 mg total) by mouth 2 (two) times daily. TAKE ONE TABLET 2 HOURS PRIOR TO CT SCAN. 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     Social Drivers of Corporate investment banker Strain: Not on file  Food Insecurity: Not on file  Transportation Needs: Not on file  Physical Activity: Not on file  Stress: Not on file  Social Connections: Unknown (09/17/2021)   Received [facility]   Social Network    Social Network: Not on file  Intimate Partner Violence: Unknown (08/10/2021)   Received from [facility]   HITS    Physically Hurt: Not on file    Insult or Talk Down  To: Not on file    Threaten Physical Harm: Not on file    Scream or Curse: Not on file   Family History  Problem Relation Age of Onset   Diabetes Mother    Anxiety disorder Father    Heart disease Father        No details   Breast cancer Maternal Grandmother        in her 35s   ADD / ADHD Neg Hx    Alcohol abuse Neg Hx    Drug abuse Neg Hx    Bipolar disorder Neg Hx    Depression Neg Hx    Dementia Neg Hx    OCD Neg Hx    Paranoid behavior Neg Hx    Schizophrenia Neg Hx    Seizures Neg Hx    Physical abuse Neg Hx    Sexual abuse Neg Hx     Objective: Office vital signs reviewed. BP 134/72   Pulse 89   Temp 98 F (36.7 C)   Ht 5' 2 (1.575 m)   Wt 159 lb 2 oz (72.2 kg)   SpO2 90%   BMI 29.10 kg/m   Physical Examination:  General: Awake, alert, well nourished, No acute distress HEENT: She has a nodular lesion at the apex of her scalp anteriorly with an associated irregularly shaped plaque/hyperkeratotic area of skin that is approximately 0.5 cm x 1 cm in size total GU: external vaginal tissue atrophic, cervix high into the patient's anatomic right, no punctate lesions on cervix appreciated but cervix is friable with Pap smear, no discharge from cervical os, scant bleeding, no cervical motion tenderness, no abdominal/ adnexal masses    Assessment/ Plan: 73 y.o. female   Abnormal uterine bleeding (AUB) - Plan: Cytology - PAP, US  Pelvic Complete With Transvaginal, Wet Prep for Trick, Yeast, Clue  Dysuria - Plan: Urinalysis, Routine w reflex microscopic, Urine Culture, Wet Prep for Trick, Yeast, Clue  Skin lesion of scalp - Plan: Ambulatory referral to Dermatology  Vagina itching - Plan: fluconazole  (DIFLUCAN ) 150 MG tablet   There is no blood on urinalysis.  I question possible abnormal uterine bleeding and given that she is postmenopausal we discussed risk of undiagnosed uterine cancer versus fibroids.  No obvious polyp or abnormality on exam except for friable  cervix.  Cotesting with GC chlamydia also placed but I doubt that this is an issue for her.  Ultrasound ordered.  Will send urine for culture.  Empiric treat with Diflucan .  Differential diagnosis for vaginal itching includes lichen sclerosis but her physical exam was not very convincing  for this today  For the skin lesion of the scalp, cannot rule out basal cell carcinoma so referral to dermatology placed.   Ann CHRISTELLA Fielding, DO [facility] [phone]

## 2024-02-29 ENCOUNTER — Telehealth: Payer: Self-pay

## 2024-02-29 ENCOUNTER — Other Ambulatory Visit: Payer: Self-pay | Admitting: Family Medicine

## 2024-02-29 LAB — URINALYSIS, ROUTINE W REFLEX MICROSCOPIC
Bilirubin, UA: NEGATIVE
Glucose, UA: NEGATIVE
Ketones, UA: NEGATIVE
Nitrite, UA: NEGATIVE
Protein,UA: NEGATIVE
RBC, UA: NEGATIVE
Specific Gravity, UA: 1.02 (ref 1.005–1.030)
Urobilinogen, Ur: 2 mg/dL — ABNORMAL HIGH (ref 0.2–1.0)
pH, UA: 6 (ref 5.0–7.5)

## 2024-02-29 LAB — MICROSCOPIC EXAMINATION
Renal Epithel, UA: NONE SEEN /HPF
Yeast, UA: NONE SEEN

## 2024-02-29 NOTE — Telephone Encounter (Unsigned)
 Copied from CRM [id number]. Topic: Referral - Question >> Feb 29, 2024  9:57 AM Selinda RAMAN wrote: Reason for CRM: The patient called in stating she was notified by [facility] that the first available appointment is not until January 13th. This concerns her because she said she has a spot on her head that her provider is very concerned about and that is why she referred her to Dermatology for evaluation. She wants to know if she can afford to wait or does she need a new referral elsewhere so she can be seen sooner? Please assist patient further.

## 2024-03-01 ENCOUNTER — Telehealth: Payer: Self-pay | Admitting: Family Medicine

## 2024-03-01 NOTE — Telephone Encounter (Signed)
 So I talked to her at length that this was a SLOW growing lesion and that as long as she followed up with them in a couple months that would be fine.  January appt is fine.

## 2024-03-01 NOTE — Telephone Encounter (Signed)
 Copied from CRM [id number]. Topic: Clinical - Request for Lab/Test Order >> Mar 01, 2024 12:52 PM Delon T wrote: Reason for CRM: returning call about ultrasound- advised of appt sheduled on 10/29- also asking if Dr KANDICE could find her a dermatology appt sooner- [phone]

## 2024-03-01 NOTE — Telephone Encounter (Signed)
 Spoke to pt and she is going to call around to see if there is somewhere that has a sooner opening. LS

## 2024-03-01 NOTE — Telephone Encounter (Signed)
 Duplicate message

## 2024-03-01 NOTE — Telephone Encounter (Signed)
 The patient called back to say she is sorry but has a lot of anxiety about the lesion and it being a malignancy. She says if there is any way she can be seen sooner anywhere she would really appreciate it. She said she will contact [facility] to check with them periodically to see if there have been any cancellations so she can be seen sooner. Please assist patient further.

## 2024-03-01 NOTE — Telephone Encounter (Signed)
 Lm on vm for a return call to relay provider message. LS

## 2024-03-02 LAB — WET PREP FOR TRICH, YEAST, CLUE

## 2024-03-02 LAB — URINE CULTURE

## 2024-03-04 ENCOUNTER — Telehealth: Payer: Self-pay | Admitting: Family Medicine

## 2024-03-04 ENCOUNTER — Other Ambulatory Visit: Payer: Self-pay | Admitting: Family Medicine

## 2024-03-04 ENCOUNTER — Ambulatory Visit: Payer: Self-pay | Admitting: Family Medicine

## 2024-03-04 DIAGNOSIS — E119 Type 2 diabetes mellitus without complications: Secondary | ICD-10-CM

## 2024-03-04 DIAGNOSIS — N939 Abnormal uterine and vaginal bleeding, unspecified: Secondary | ICD-10-CM

## 2024-03-04 DIAGNOSIS — E781 Pure hyperglyceridemia: Secondary | ICD-10-CM

## 2024-03-04 LAB — CYTOLOGY - PAP
Chlamydia: NEGATIVE
Comment: NEGATIVE
Comment: NEGATIVE
Comment: NORMAL
Diagnosis: NEGATIVE
High risk HPV: NEGATIVE
Neisseria Gonorrhea: NEGATIVE

## 2024-03-05 ENCOUNTER — Other Ambulatory Visit: Payer: Self-pay | Admitting: Family Medicine

## 2024-03-05 ENCOUNTER — Telehealth: Payer: Self-pay | Admitting: Pharmacy Technician

## 2024-03-05 ENCOUNTER — Other Ambulatory Visit (HOSPITAL_COMMUNITY): Payer: Self-pay

## 2024-03-05 DIAGNOSIS — E119 Type 2 diabetes mellitus without complications: Secondary | ICD-10-CM

## 2024-03-05 NOTE — Telephone Encounter (Signed)
 Name from pharmacy: RYBELSUS  3 MG TABLET   Pharmacy comment: Alternative Requested:PRIOR AUTHORIZATION NEEDED FOR MORE THAN 60 TABS PER 365 DAYS.   Pt's next OV is not till 05/15/24 6 mos FU

## 2024-03-05 NOTE — Telephone Encounter (Signed)
 PA request has been Received. New Encounter has been or will be created for follow up. For additional info see Pharmacy Prior Auth telephone encounter from 03/05/24.

## 2024-03-05 NOTE — Telephone Encounter (Signed)
 Pharmacy Patient Advocate Encounter   Received notification from Pt Calls Messages that prior authorization for Rybelsus  3 mg tabs is required/requested.   Insurance verification completed.   The patient is insured through [employer].   Per test claim:  RYBELSUS  7 MG is preferred by the insurance.  If suggested medication is appropriate, Please send in a new RX and discontinue this one. If not, please advise as to why it's not appropriate so that we may request a Prior Authorization. Please note, some preferred medications may still require a PA.  If the suggested medications have not been trialed and there are no contraindications to their use, the PA will not be submitted, as it will not be approved.   Rybelsus  (semaglutide ) is a medication used to treat type 2 diabetes. According to clinical information, the 3 mg dose is an initial dose intended to help patients tolerate the medication and is not considered therapeutically effective for glycemic control. It is indicated that the dosage should be increased to 7 mg after 30 days on the 3 mg dose.  The purpose of the initial 3 mg dosage is to help the body adjust to the medication and minimize potential gastrointestinal side effects. Following this initial period, an increase in dosage is indicated to achieve therapeutic benefit.   If the patient is unable to titrate up to 7 mg we need medical documentation as to why she can't titrate up.  Test billed Rybelsus  7mg  and her copay was $0.00

## 2024-03-05 NOTE — Telephone Encounter (Signed)
 If you look at her record, we attempted to increase to 7mg  in July but she could not tolerate. Hence, the reduction back to 3mg 

## 2024-03-06 ENCOUNTER — Other Ambulatory Visit (HOSPITAL_COMMUNITY): Payer: Self-pay

## 2024-03-06 ENCOUNTER — Ambulatory Visit (HOSPITAL_COMMUNITY)
Admission: RE | Admit: 2024-03-06 | Discharge: 2024-03-06 | Disposition: A | Discharge status: Home / Self Care | Source: Ambulatory Visit | Attending: Family Medicine | Admitting: Family Medicine

## 2024-03-06 DIAGNOSIS — N939 Abnormal uterine and vaginal bleeding, unspecified: Secondary | ICD-10-CM | POA: Insufficient documentation

## 2024-03-07 ENCOUNTER — Other Ambulatory Visit (HOSPITAL_COMMUNITY): Payer: Self-pay

## 2024-03-07 NOTE — Telephone Encounter (Signed)
 Good afternoon I contacted [employer] today and they asked that we allow them 24 to 48 hrs to review the PA request to keep Ann Snyder at 3 mg dose of Rybelsus 

## 2024-03-08 ENCOUNTER — Other Ambulatory Visit (HOSPITAL_COMMUNITY): Payer: Self-pay

## 2024-03-12 ENCOUNTER — Other Ambulatory Visit (HOSPITAL_COMMUNITY): Payer: Self-pay

## 2024-03-14 ENCOUNTER — Other Ambulatory Visit (HOSPITAL_COMMUNITY): Payer: Self-pay

## 2024-03-20 NOTE — Telephone Encounter (Signed)
 Printed results letter from PCP, dated 03/05/24, and will mail to the patient.

## 2024-03-21 ENCOUNTER — Other Ambulatory Visit (HOSPITAL_COMMUNITY)
Admission: RE | Admit: 2024-03-21 | Discharge: 2024-03-21 | Disposition: A | Discharge status: Home / Self Care | Source: Ambulatory Visit | Attending: Obstetrics & Gynecology | Admitting: Obstetrics & Gynecology

## 2024-03-21 ENCOUNTER — Encounter: Payer: Self-pay | Admitting: Obstetrics & Gynecology

## 2024-03-21 ENCOUNTER — Ambulatory Visit: Admitting: Obstetrics & Gynecology

## 2024-03-21 VITALS — BP 171/79 | HR 92 | Ht 62.0 in | Wt 160.0 lb

## 2024-03-21 DIAGNOSIS — N950 Postmenopausal bleeding: Secondary | ICD-10-CM | POA: Diagnosis present

## 2024-03-21 DIAGNOSIS — R9389 Abnormal findings on diagnostic imaging of other specified body structures: Secondary | ICD-10-CM

## 2024-03-21 NOTE — Progress Notes (Signed)
 GYN VISIT Patient name: Ann Snyder MRN [id number]  Date of birth: 02/14/1951 Chief Complaint:   Procedure (Endo bx)  History of Present Illness:   Ann Snyder is a 73 y.o. [id number] PM female being seen today for vaginal bleeding.    Pt had one episode of spotting when she voided, noted red urine in the toilet.  She denies any further episodes of bleeding.  Of note she reports history of UTIs and yeast infections and thought it may have been related to that.  She was seen by PCP, UA was negative and was treated for yeast infection.  An ultrasound was also completed (see below)  Denies vaginal discharge, itching or irritation.  Reports no acute GYN concerns   Recent US : 10/30: 6.6 x 2.9 x 3.9cm uterus- 39mL.  Several small fibroids noted all less than 3 cm in size.  Endometrium measuring 5.94mm.   Bilateral ovaries were not visualized  No LMP recorded. Patient is postmenopausal.    Review of Systems:   Pertinent items are noted in HPI Denies fever/chills, dizziness, headaches, visual disturbances, fatigue, shortness of breath, chest pain, abdominal pain, vomiting. Pertinent History Reviewed:   Past Surgical History:  Procedure Laterality Date   BREAST EXCISIONAL BIOPSY Left    2000   CESAREAN SECTION     3 times    Past Medical History:  Diagnosis Date   Anxiety    Depression    Essential hypertension 12/20/2016   Headache(784.0)    High cholesterol    OSA (obstructive sleep apnea) 10/07/2014   Moderate with AHI 20/hr   Reviewed problem list, medications and allergies. Physical Assessment:   Vitals:   03/21/24 1420  BP: (!) 171/79  Pulse: 92  Weight: 160 lb (72.6 kg)  Height: 5' 2 (1.575 m)  Body mass index is 29.26 kg/m.       Physical Examination:   General appearance: alert, well appearing, and in no distress  Psych: mood appropriate, normal affect  Skin: warm & dry   Cardiovascular: normal heart rate noted  Respiratory: normal respiratory  effort, no distress  Abdomen: soft, non-tender, no rebound or guarding  Pelvic: VULVA: normal appearing vulva with no masses, tenderness or lesions, VAGINA: normal appearing vagina with normal color and discharge, no lesions -atrophic changes appreciated CERVIX: normal appearing cervix without discharge or lesions, UTERUS: uterus is normal size, shape, consistency and nontender, no adnexal masses noted, no tenderness  Extremities: no edema   Chaperone: Alan Fischer    Endometrial Biopsy Procedure Note  Pre-operative Diagnosis: PMB  Post-operative Diagnosis: same  Procedure Details  The risks (including infection, bleeding, pain, and uterine perforation) and benefits of the procedure were explained to the patient and Written informed consent was obtained.  Antibiotic prophylaxis against endocarditis was not indicated.   The patient was placed in the dorsal lithotomy position.  Bimanual exam showed the uterus to be in the neutral position.  A speculum inserted in the vagina, and the cervix prepped with betadine.     A single tooth tenaculum was applied to the anterior lip of the cervix for stabilization.  Os finder was used.  A Pipelle endometrial aspirator was used to sample the endometrium.  Sample was sent for pathologic examination.  Condition: Stable  Complications: None    Assessment & Plan:  1) PMB - Reviewed recommendation for EMB, procedure completed above - Further management pending results of pathology - Due to atrophic changes, concern for inadequate sampling - Based on  clinical history and ultrasound, low suspicion for uterine cancer.  Should sampling be an adequate we will plan to monitor bleeding pattern and consider further intervention should bleeding return - Questions and concerns were addressed and patient was agreeable to above  2) Elevated blood pressure -pt left office prior to repeat being checkec -pt notes being flustered from getting lost trying to get  here    No orders of the defined types were placed in this encounter.   Return if symptoms worsen or fail to improve.   Crystina Borrayo, DO Attending Obstetrician & Gynecologist, [facility] for [employer], [facility]

## 2024-03-25 LAB — SURGICAL PATHOLOGY

## 2024-04-01 ENCOUNTER — Ambulatory Visit: Payer: Self-pay | Admitting: Obstetrics & Gynecology

## 2024-04-23 ENCOUNTER — Encounter: Admitting: Obstetrics & Gynecology

## 2024-05-15 ENCOUNTER — Encounter: Payer: Self-pay | Admitting: Family Medicine

## 2024-05-15 ENCOUNTER — Ambulatory Visit: Payer: Self-pay | Admitting: Family Medicine

## 2024-05-15 VITALS — BP 153/72 | HR 81 | Temp 98.0°F | Ht 62.0 in | Wt 161.1 lb

## 2024-05-15 DIAGNOSIS — R102 Pelvic and perineal pain unspecified side: Secondary | ICD-10-CM

## 2024-05-15 DIAGNOSIS — E559 Vitamin D deficiency, unspecified: Secondary | ICD-10-CM

## 2024-05-15 DIAGNOSIS — M85851 Other specified disorders of bone density and structure, right thigh: Secondary | ICD-10-CM | POA: Diagnosis not present

## 2024-05-15 DIAGNOSIS — E1159 Type 2 diabetes mellitus with other circulatory complications: Secondary | ICD-10-CM | POA: Diagnosis not present

## 2024-05-15 DIAGNOSIS — Z0001 Encounter for general adult medical examination with abnormal findings: Secondary | ICD-10-CM | POA: Diagnosis not present

## 2024-05-15 DIAGNOSIS — E538 Deficiency of other specified B group vitamins: Secondary | ICD-10-CM

## 2024-05-15 DIAGNOSIS — E785 Hyperlipidemia, unspecified: Secondary | ICD-10-CM | POA: Diagnosis not present

## 2024-05-15 DIAGNOSIS — Z0000 Encounter for general adult medical examination without abnormal findings: Secondary | ICD-10-CM

## 2024-05-15 DIAGNOSIS — M25561 Pain in right knee: Secondary | ICD-10-CM | POA: Diagnosis not present

## 2024-05-15 DIAGNOSIS — I152 Hypertension secondary to endocrine disorders: Secondary | ICD-10-CM | POA: Diagnosis not present

## 2024-05-15 DIAGNOSIS — G8929 Other chronic pain: Secondary | ICD-10-CM

## 2024-05-15 DIAGNOSIS — Z7984 Long term (current) use of oral hypoglycemic drugs: Secondary | ICD-10-CM | POA: Diagnosis not present

## 2024-05-15 DIAGNOSIS — F418 Other specified anxiety disorders: Secondary | ICD-10-CM | POA: Diagnosis not present

## 2024-05-15 DIAGNOSIS — E1169 Type 2 diabetes mellitus with other specified complication: Secondary | ICD-10-CM

## 2024-05-15 DIAGNOSIS — E119 Type 2 diabetes mellitus without complications: Secondary | ICD-10-CM

## 2024-05-15 LAB — URINALYSIS, ROUTINE W REFLEX MICROSCOPIC
Bilirubin, UA: NEGATIVE
Glucose, UA: NEGATIVE
Ketones, UA: NEGATIVE
Nitrite, UA: NEGATIVE
Protein,UA: NEGATIVE
Specific Gravity, UA: 1.02 (ref 1.005–1.030)
Urobilinogen, Ur: 0.2 mg/dL (ref 0.2–1.0)
pH, UA: 5.5 (ref 5.0–7.5)

## 2024-05-15 LAB — BAYER DCA HB A1C WAIVED: HB A1C (BAYER DCA - WAIVED): 6.2 % — ABNORMAL HIGH (ref 4.8–5.6)

## 2024-05-15 LAB — MICROSCOPIC EXAMINATION
Renal Epithel, UA: NONE SEEN /HPF
Yeast, UA: NONE SEEN

## 2024-05-15 MED ORDER — BUPROPION HCL ER (SR) 150 MG PO TB12: ORAL_TABLET | ORAL | 3 refills | Status: AC

## 2024-05-15 MED ORDER — AMLODIPINE BESYLATE 5 MG PO TABS: 5.0000 mg | ORAL_TABLET | Freq: Every day | ORAL | 3 refills | Status: AC

## 2024-05-15 MED ORDER — LISINOPRIL 40 MG PO TABS: 40.0000 mg | ORAL_TABLET | Freq: Every day | ORAL | 3 refills | Status: AC

## 2024-05-15 MED ORDER — FENOFIBRATE 48 MG PO TABS: 48.0000 mg | ORAL_TABLET | Freq: Every day | ORAL | 3 refills | Status: AC

## 2024-05-15 MED ORDER — RYBELSUS 7 MG PO TABS: 7.0000 mg | ORAL_TABLET | Freq: Every day | ORAL | 3 refills | Status: AC

## 2024-05-15 MED ORDER — MIRTAZAPINE 30 MG PO TABS: 30.0000 mg | ORAL_TABLET | Freq: Every day | ORAL | 3 refills | Status: AC

## 2024-05-15 MED ORDER — ROSUVASTATIN CALCIUM 10 MG PO TABS: 10.0000 mg | ORAL_TABLET | Freq: Every day | ORAL | 3 refills | Status: AC

## 2024-05-15 MED ORDER — MELOXICAM 7.5 MG PO TABS: 7.5000 mg | ORAL_TABLET | Freq: Every day | ORAL | 3 refills | Status: AC | PRN

## 2024-05-15 NOTE — Progress Notes (Signed)
 "  Ann Snyder is a 74 y.o. female presents to office today for annual physical exam examination.    Patient has been seen by OB/GYN.  She had uterine sampling done which was inadequate but based on ultrasound there was low concern for pathologic processes.  She notes that she had recurrent symptomology that resulted in bleeding after urination.  There was concern that maybe this was UTI mediated and she was advised to come in for urine specimen.  Patient sadly could not make it due to work restrictions but is willing to do a urinalysis today.  She continues to have intermittent pelvic pressure.  No observed hematuria or vaginal bleeding.  No reports of flank pain, nausea, vomiting or fevers.  She notes she has not taken her medications this morning because she was trying to fast for her labs.  She is taking the Rybelsus  7 mg typically and this is going well from a GI standpoint.  She has not had any recurrent GI issues on the higher dose.  Still working on weight.  No reports of structured exercise.  She did report some stabbing chest pain that occurred recently but notes that that has totally resolved.  She continues to be monitored closely by cardiology and had a clean bill of health on her last visit last year.  She is compliant with her medications.  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, Marital status: widowed, Substance use: none Health Maintenance Due  Topic Date Due   FOOT EXAM  Never done   Bone Density Scan  03/09/2024   HEMOGLOBIN A1C  04/23/2024    Immunization History  Administered Date(s) Administered   Influenza-Unspecified 02/21/2024   Moderna Sars-Covid-2 Vaccination 05/22/2019, 06/11/2019   Pneumococcal Conjugate-13 01/11/2019   Td 03/08/2022   Tdap 03/20/2009   Zoster Recombinant(Shingrix) 10/27/2020, 02/10/2021   Past Medical History:  Diagnosis Date   Anxiety    Depression    Essential hypertension 12/20/2016   Headache(784.0)    High cholesterol     OSA (obstructive sleep apnea) 10/07/2014   Moderate with AHI 20/hr   Social History   Socioeconomic History   Marital status: Widowed    Spouse name: Not on file   Number of children: Not on file   Years of education: Not on file   Highest education level: Some college, no degree  Occupational History   Not on file  Tobacco Use   Smoking status: Never   Smokeless tobacco: Never  Vaping Use   Vaping status: Never Used  Substance and Sexual Activity   Alcohol use: No   Drug use: No   Sexual activity: Not on file  Other Topics Concern   Not on file  Social History Narrative   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     Social Drivers of Health   Tobacco Use: Low Risk (05/15/2024)   Patient History    Smoking Tobacco Use: Never    Smokeless Tobacco Use: Never    Passive Exposure: Not on file  Financial Resource Strain: Low Risk (05/13/2024)   Overall Financial Resource Strain (CARDIA)    Difficulty of Paying Living Expenses: Not very hard  Food Insecurity: No Food Insecurity (05/13/2024)   Epic    Worried About Radiation Protection Practitioner of Food in the Last Year: Never true    Ran Out of Food in the Last Year: Never true  Transportation Needs: No Transportation Needs (05/13/2024)   Epic    Lack of Transportation (Medical):  No    Lack of Transportation (Non-Medical): No  Physical Activity: Not on file  Stress: Not on file  Social Connections: Moderately Isolated (05/13/2024)   Social Connection and Isolation Panel    Frequency of Communication with Friends and Family: Once a week    Frequency of Social Gatherings with Friends and Family: Once a week    Attends Religious Services: More than 4 times per year    Active Member of [employer] or Organizations: Yes    Attends Banker Meetings: More than 4 times per year    Marital Status: Widowed  Intimate Partner Violence: Unknown (08/10/2021)   Received from [facility]   HITS    Physically Hurt: Not on file    Insult or Talk Down To: Not  on file    Threaten Physical Harm: Not on file    Scream or Curse: Not on file  Depression (PHQ2-9): Low Risk (02/28/2024)   Depression (PHQ2-9)    PHQ-2 Score: 0  Alcohol Screen: Not on file  Housing: Unknown (05/13/2024)   Epic    Unable to Pay for Housing in the Last Year: No    Number of Times Moved in the Last Year: Not on file    Homeless in the Last Year: No  Utilities: Not on file  Health Literacy: Not on file   Past Surgical History:  Procedure Laterality Date   BREAST EXCISIONAL BIOPSY Left    2000   CESAREAN SECTION     3 times   Family History  Problem Relation Age of Onset   Diabetes Mother    Anxiety disorder Father    Heart disease Father        No details   Breast cancer Maternal Grandmother        in her 83s   ADD / ADHD Neg Hx    Alcohol abuse Neg Hx    Drug abuse Neg Hx    Bipolar disorder Neg Hx    Depression Neg Hx    Dementia Neg Hx    OCD Neg Hx    Paranoid behavior Neg Hx    Schizophrenia Neg Hx    Seizures Neg Hx    Physical abuse Neg Hx    Sexual abuse Neg Hx    Current Medications[1]  Allergies[2]   ROS: Review of Systems Pertinent items noted in HPI and remainder of comprehensive ROS otherwise negative.    Physical exam BP (!) 153/72   Pulse 81   Temp 98 F (36.7 C)   Ht 5' 2 (1.575 m)   Wt 161 lb 2 oz (73.1 kg)   SpO2 95%   BMI 29.47 kg/m  General appearance: alert, cooperative, appears stated age, and no distress Head: Normocephalic, without obvious abnormality, atraumatic Eyes: negative findings: lids and lashes normal, conjunctivae and sclerae normal, corneas clear, and pupils equal, round, reactive to light and accomodation Ears: normal TM's and external ear canals both ears Nose: Nares normal. Septum midline. Mucosa normal. No drainage or sinus tenderness. Throat: lips, mucosa, and tongue normal; teeth and gums normal Neck: no adenopathy, no carotid bruit, supple, symmetrical, trachea midline, and thyroid  not  enlarged, symmetric, no tenderness/mass/nodules Back: symmetric, no curvature. ROM normal. No CVA tenderness. Lungs: clear to auscultation bilaterally Heart: regular rate and rhythm, S1, S2 normal, no murmur, click, rub or gallop Abdomen: soft, non-tender; bowel sounds normal; no masses,  no organomegaly Extremities: extremities normal, atraumatic, no cyanosis or edema Pulses: 2+ and symmetric Skin: Skin  color, texture, turgor normal. No rashes or lesions Lymph nodes: No supraclavicular or anterior cervical lymph node enlargement Neurologic: Alert and oriented X 3, normal strength and tone. Normal symmetric reflexes. Normal coordination and gait      02/28/2024    4:15 PM 07/27/2022    9:59 AM 07/05/2022    2:25 PM  Depression screen PHQ 2/9  Decreased Interest 0 0 0  Down, Depressed, Hopeless 0 0 0  PHQ - 2 Score 0 0 0  Altered sleeping 0 0 0  Tired, decreased energy 0 0 0  Change in appetite 0 0 0  Feeling bad or failure about yourself  0 0 0  Trouble concentrating 0 0 0  Moving slowly or fidgety/restless 0 0 0  Suicidal thoughts 0 0 0  PHQ-9 Score 0  0  0   Difficult doing work/chores Not difficult at all Not difficult at all Not difficult at all     Data saved with a previous flowsheet row definition      02/28/2024    4:15 PM 07/27/2022    9:59 AM 03/08/2022    1:21 PM 08/24/2021   12:01 PM  GAD 7 : Generalized Anxiety Score  Nervous, Anxious, on Edge 0 0 0 0  Control/stop worrying 0 0 0 0  Worry too much - different things 0 0 0 0  Trouble relaxing 0 0 0 0  Restless 0 0 0 0  Easily annoyed or irritable 0 0 0 0  Afraid - awful might happen 0 0 0 0  Total GAD 7 Score 0 0 0 0  Anxiety Difficulty Not difficult at all Not difficult at all Not difficult at all Not difficult at all    Recent Results (from the past 2160 hours)  Urinalysis, Routine w reflex microscopic     Status: Abnormal   Collection Time: 02/28/24  4:03 PM  Result Value Ref Range   Specific Gravity,  UA 1.020 1.005 - 1.030   pH, UA 6.0 5.0 - 7.5   Color, UA Yellow Yellow   Appearance Ur Clear Clear   Leukocytes,UA 1+ (A) Negative   Protein,UA Negative Negative/Trace   Glucose, UA Negative Negative   Ketones, UA Negative Negative   RBC, UA Negative Negative   Bilirubin, UA Negative Negative   Urobilinogen, Ur 2.0 (H) 0.2 - 1.0 mg/dL   Nitrite, UA Negative Negative   Microscopic Examination See below:   Urine Culture     Status: None   Collection Time: 02/28/24  4:03 PM   Specimen: Urine   UR  Result Value Ref Range   Urine Culture, Routine Final report    Organism ID, Bacteria Comment     Comment: Mixed urogenital flora Less than 10,000 colonies/mL   Microscopic Examination     Status: Abnormal   Collection Time: 02/28/24  4:03 PM   Urine  Result Value Ref Range   WBC, UA 0-5 0 - 5 /hpf   RBC, Urine 0-2 0 - 2 /hpf   Epithelial Cells (non renal) 0-10 0 - 10 /hpf   Renal Epithel, UA None seen None seen /hpf   Bacteria, UA Few (A) None seen/Few   Yeast, UA None seen None seen  Cytology - PAP     Status: None   Collection Time: 02/28/24  4:38 PM  Result Value Ref Range   High risk HPV Negative    Neisseria Gonorrhea Negative    Chlamydia Negative    Adequacy  Satisfactory for evaluation; transformation zone component PRESENT.   Diagnosis      - Negative for intraepithelial lesion or malignancy (NILM)   Comment Normal Reference Range HPV - Negative    Comment Normal Reference Ranger Chlamydia - Negative    Comment      Normal Reference Range Neisseria Gonorrhea - Negative  WET PREP FOR TRICH, YEAST, CLUE     Status: None   Collection Time: 02/29/24  4:02 PM  Result Value Ref Range   Trichomonas Exam CANCELED     Comment: Test not performed. The required specimen for the test ordered was not received. REC'D DRY ASHER JOURNEY [facility] POUCH 03/02/2024-Long  Result canceled by the ancillary.    Yeast Exam CANCELED     Comment: Test not performed. The required  specimen for the test ordered was not received. REC'D DRY ASHER JOURNEY [facility] POUCH 03/02/2024-Long  Result canceled by the ancillary.    Clue Cell Exam CANCELED     Comment: Test not performed. The required specimen for the test ordered was not received. REC'D DRY ASHER JOURNEY [facility] POUCH 03/02/2024-Long  Result canceled by the ancillary.   Surgical pathology( [place]/ [facility])     Status: None   Collection Time: 03/21/24  2:37 PM  Result Value Ref Range   SURGICAL PATHOLOGY      SURGICAL PATHOLOGY CASE: [id number] PATIENT: [facility] Surgical Pathology Report     Clinical History: thickened endometrium, PMB (cf)     FINAL MICROSCOPIC DIAGNOSIS:  A. ENDOMETRIUM, BIOPSY: - Benign ectocervical and endocervical mucosa - No dysplasia or malignancy identified - No endometrial tissue identified   GROSS DESCRIPTION:  Specimen is received in formalin and consists of a 1.7 x 1.0 x 0.4 cm aggregate of tan mucus.  The specimen is entirely submitted in 1 cassette.  SHIRLEEN 03/22/2024)  Final Diagnosis performed by Ilsa Pottier, MD.   Electronically signed 03/25/2024 Technical component performed at [facility]. [facility], [address] [place], [place] [address].  Professional component performed at [facility], [address] [place], [place] [address].  Immunohistochemistry Technical component (if applicable) was performed at [facility]. [address], [place], [place]  [address].   IMMUNOHISTOCHEMISTRY DISCLAIMER (if applicable): Some of these immunohistochemical stains may have been developed and the performance characteristics determine by [facility]. Some may not have been cleared or approved by the U.S. Food and Drug Administration. The FDA has determined that such clearance or approval is not necessary. This test is used for clinical purposes. It should not be regarded as  investigational or for research. This laboratory is certified under the Clinical Laboratory Improvement Amendments of 1988 (CLIA-88) as qualified to perform high complexity clinical laboratory testing.  The controls stained appropriately.   IHC stains are performed on formalin fixed, paraffin embedded tissue using a 3,3diaminobenzidine (DAB) chromogen and Leica Bond Autostainer System. The staining intensity of the nucleus is score manually and is reported as the percentage of tumor cell nuclei demonstrating specific nuclear staining. The specimens are  fixed in 10% Neutral Formalin for at least 6 hours and up to 72hrs. These tests are validated on decalcified tissue. Results should be interpreted with caution given the possibility of false negative results on decalcified specimens. Antibody Clones are as follows ER-clone 5F, PR-clone 16, Ki67- clone MM1. Some of these immunohistochemical stains may have been developed and the performance characteristics determined by [facility].      Assessment/ Plan:  Barnie JINNY Pan here for annual physical exam.   Annual physical exam  Diabetes mellitus treated with oral medication (HCC) - Plan: CMP14+EGFR, Bayer DCA Hb A1c Waived, Semaglutide  (RYBELSUS ) 7 MG TABS  Hypertension associated with diabetes (HCC) - Plan: CMP14+EGFR, amLODipine  (NORVASC ) 5 MG tablet, lisinopril  (ZESTRIL ) 40 MG tablet  Hyperlipidemia associated with type 2 diabetes mellitus (HCC) - Plan: CMP14+EGFR, Lipid Panel, TSH, fenofibrate  (TRICOR ) 48 MG tablet, rosuvastatin  (CRESTOR ) 10 MG tablet  Osteopenia of neck of right femur - Plan: CMP14+EGFR, VITAMIN D  25 Hydroxy (Vit-D Deficiency, Fractures), DG [facility] DEXA  Vitamin D  deficiency - Plan: CMP14+EGFR, VITAMIN D  25 Hydroxy (Vit-D Deficiency, Fractures), DG [facility] DEXA  Vitamin B12 deficiency - Plan: CMP14+EGFR, Vitamin B12, CBC with Differential  Depression with anxiety - Plan: buPROPion  (WELLBUTRIN  SR) 150 MG 12  hr tablet, mirtazapine  (REMERON ) 30 MG tablet  Chronic pain of right knee - Plan: meloxicam  (MOBIC ) 7.5 MG tablet  Pelvic pain - Plan: Urinalysis, Routine w reflex microscopic   Fasting labs collected today.  Urinalysis without evidence of UTI or bleeding.  Absolutely recommend follow-up with OB/GYN given recurrent symptomology.  We will perform foot exam at next visit as she had multiple other concerns which we addressed instead today  I would like her to have a DEXA at some point given osteopenia.  Check vitamin D , calcium  and renal function.  Continue Rybelsus  at 7 mg daily.  Resume blood pressure medication and I like her to follow-up with blood pressure readings over the next 2 weeks.  If not controlled, could consider advancing amlodipine  to 10 mg daily.  Her chest pain sounds similar to last year's chest pain which was precordial and not of cardiac etiology.  However, if symptoms recur, low threshold to be evaluated again by cardiology.  Counseled on healthy lifestyle choices, including diet (rich in fruits, vegetables and lean meats and low in salt and simple carbohydrates) and exercise (at least 30 minutes of moderate physical activity daily).  Patient to follow up 48m for DM  Dalynn Jhaveri M. Jetty Berland, DO        [1]  Current Outpatient Medications:    aspirin  EC (ASPIRIN  LOW DOSE) 81 MG tablet, TAKE 1 TABLET (81 MG TOTAL) BY MOUTH DAILY. SWALLOW WHOLE., Disp: 90 tablet, Rfl: 3   Cholecalciferol (VITAMIN D -3) 125 MCG (5000 UT) TABS, Take 5,000 Units by mouth daily., Disp: , Rfl:    Semaglutide  (RYBELSUS ) 7 MG TABS, Take 1 tablet (7 mg total) by mouth daily., Disp: 90 tablet, Rfl: 3   UNABLE TO FIND, Take by mouth daily. Med Name: screen shield eye promise, Disp: , Rfl:    amLODipine  (NORVASC ) 5 MG tablet, Take 1 tablet (5 mg total) by mouth daily., Disp: 90 tablet, Rfl: 3   buPROPion  (WELLBUTRIN  SR) 150 MG 12 hr tablet, TAKE 1 TABLET BY MOUTH EVERY DAY IN THE MORNING, Disp: 90 tablet,  Rfl: 3   fenofibrate  (TRICOR ) 48 MG tablet, Take 1 tablet (48 mg total) by mouth daily., Disp: 90 tablet, Rfl: 3   lisinopril  (ZESTRIL ) 40 MG tablet, Take 1 tablet (40 mg total) by mouth daily., Disp: 90 tablet, Rfl: 3   meloxicam  (MOBIC ) 7.5 MG tablet, Take 1 tablet (7.5 mg total) by mouth daily as needed for pain., Disp: 90 tablet, Rfl: 3   mirtazapine  (REMERON ) 30 MG tablet, Take 1 tablet (30 mg total) by mouth at bedtime., Disp: 90 tablet, Rfl: 3   rosuvastatin  (CRESTOR ) 10 MG tablet, Take 1 tablet (10 mg total)  by mouth daily., Disp: 90 tablet, Rfl: 3 [2] No Known Allergies  "

## 2024-05-16 LAB — VITAMIN D 25 HYDROXY (VIT D DEFICIENCY, FRACTURES): Vit D, 25-Hydroxy: 40.3 ng/mL (ref 30.0–100.0)

## 2024-05-16 LAB — CMP14+EGFR
ALT: 33 IU/L — ABNORMAL HIGH (ref 0–32)
AST: 27 IU/L (ref 0–40)
Albumin: 4.7 g/dL (ref 3.8–4.8)
Alkaline Phosphatase: 90 IU/L (ref 49–135)
BUN/Creatinine Ratio: 25 (ref 12–28)
BUN: 16 mg/dL (ref 8–27)
Bilirubin Total: 0.3 mg/dL (ref 0.0–1.2)
CO2: 21 mmol/L (ref 20–29)
Calcium: 9.5 mg/dL (ref 8.7–10.3)
Chloride: 105 mmol/L (ref 96–106)
Creatinine, Ser: 0.63 mg/dL (ref 0.57–1.00)
Globulin, Total: 2.6 g/dL (ref 1.5–4.5)
Glucose: 110 mg/dL — ABNORMAL HIGH (ref 70–99)
Potassium: 4.2 mmol/L (ref 3.5–5.2)
Sodium: 140 mmol/L (ref 134–144)
Total Protein: 7.3 g/dL (ref 6.0–8.5)
eGFR: 94 mL/min/1.73

## 2024-05-16 LAB — LIPID PANEL
Chol/HDL Ratio: 3.4 ratio (ref 0.0–4.4)
Cholesterol, Total: 147 mg/dL (ref 100–199)
HDL: 43 mg/dL
LDL Chol Calc (NIH): 66 mg/dL (ref 0–99)
Triglycerides: 236 mg/dL — ABNORMAL HIGH (ref 0–149)
VLDL Cholesterol Cal: 38 mg/dL (ref 5–40)

## 2024-05-16 LAB — CBC WITH DIFFERENTIAL/PLATELET
Basophils Absolute: 0.1 x10E3/uL (ref 0.0–0.2)
Basos: 1 %
EOS (ABSOLUTE): 0.3 x10E3/uL (ref 0.0–0.4)
Eos: 4 %
Hematocrit: 39.9 % (ref 34.0–46.6)
Hemoglobin: 13 g/dL (ref 11.1–15.9)
Immature Grans (Abs): 0 x10E3/uL (ref 0.0–0.1)
Immature Granulocytes: 0 %
Lymphocytes Absolute: 1.9 x10E3/uL (ref 0.7–3.1)
Lymphs: 32 %
MCH: 28.3 pg (ref 26.6–33.0)
MCHC: 32.6 g/dL (ref 31.5–35.7)
MCV: 87 fL (ref 79–97)
Monocytes Absolute: 0.5 x10E3/uL (ref 0.1–0.9)
Monocytes: 9 %
Neutrophils Absolute: 3.2 x10E3/uL (ref 1.4–7.0)
Neutrophils: 54 %
Platelets: 235 x10E3/uL (ref 150–450)
RBC: 4.59 x10E6/uL (ref 3.77–5.28)
RDW: 12.9 % (ref 11.7–15.4)
WBC: 5.9 x10E3/uL (ref 3.4–10.8)

## 2024-05-16 LAB — TSH: TSH: 1.51 u[IU]/mL (ref 0.450–4.500)

## 2024-05-16 LAB — VITAMIN B12: Vitamin B-12: 293 pg/mL (ref 232–1245)

## 2024-05-21 ENCOUNTER — Ambulatory Visit: Admitting: Dermatology

## 2024-05-21 ENCOUNTER — Encounter: Payer: Self-pay | Admitting: Dermatology

## 2024-05-21 DIAGNOSIS — W908XXA Exposure to other nonionizing radiation, initial encounter: Secondary | ICD-10-CM

## 2024-05-21 DIAGNOSIS — D485 Neoplasm of uncertain behavior of skin: Secondary | ICD-10-CM

## 2024-05-21 DIAGNOSIS — C4441 Basal cell carcinoma of skin of scalp and neck: Secondary | ICD-10-CM | POA: Diagnosis not present

## 2024-05-21 DIAGNOSIS — L578 Other skin changes due to chronic exposure to nonionizing radiation: Secondary | ICD-10-CM | POA: Diagnosis not present

## 2024-05-21 DIAGNOSIS — C4491 Basal cell carcinoma of skin, unspecified: Secondary | ICD-10-CM

## 2024-05-21 HISTORY — DX: Basal cell carcinoma of skin, unspecified: C44.91

## 2024-05-21 NOTE — Progress Notes (Signed)
" ° °  New Patient Visit   History of Present Illness Ann Snyder is a 74 year old female who presents with a suspicious spot on her scalp.  She discovered a spot on her scalp towards the end of the summer while scratching her head. The spot is not painful and located on her scalp. No itchiness is associated with the spot.  She has a history of a previous dermatological evaluation for a spot on her nose, which was biopsied and found to be non-concerning. She has not had any prior skin cancer diagnoses.  She mentions living in [place] in the past, where she was frequently exposed to the sun, often without wearing a hat. She recalls spending time on a boat with her family, which she associates with significant sun exposure.  No blood thinners are used, and she has experienced numbing procedures before without issues.  Pt has a growth on her scalp she'd like evaluated that has been present around a year. Pt has no hx of skin cancer. No family hx of MM  The following portions of the chart were reviewed this encounter and updated as appropriate: medications, allergies, medical history  Review of Systems:  No other skin or systemic complaints except as noted in HPI or Assessment and Plan.  Objective  Well appearing patient in no apparent distress; mood and affect are within normal limits.  A focused examination was performed of the following areas: scalp  Relevant exam findings are noted in the Assessment and Plan.  Mid Frontal Scalp 8mm pink pearly papule   Assessment & Plan   ACTINIC DAMAGE - chronic, secondary to cumulative UV radiation exposure/sun exposure over time - diffuse scaly erythematous macules with underlying dyspigmentation - Recommend daily broad spectrum sunscreen SPF 30+ to sun-exposed areas, reapply every 2 hours as needed.  - Recommend staying in the shade or wearing long sleeves, sun glasses (UVA+UVB protection) and wide brim hats (4-inch brim around the entire  circumference of the hat). - Call for new or changing lesions.  NEOPLASM OF UNCERTAIN BEHAVIOR OF SKIN Mid Frontal Scalp - Skin / nail biopsy Type of biopsy: tangential   Informed consent: discussed and consent obtained   Timeout: patient name, date of birth, surgical site, and procedure verified   Procedure prep:  Patient was prepped and draped in usual sterile fashion Prep type:  Isopropyl alcohol Anesthesia: the lesion was anesthetized in a standard fashion   Anesthetic:  1% lidocaine w/ epinephrine 1-100,000 buffered w/ 8.4% NaHCO3 Instrument used: DermaBlade   Hemostasis achieved with: aluminum chloride   Outcome: patient tolerated procedure well   Post-procedure details: sterile dressing applied and wound care instructions given   Dressing type: bandage and pressure dressing    Specimen 1 - Surgical pathology Differential Diagnosis: R/O NMSC  Check Margins: No  Return for TBSE with brenda.  I, Darice Smock, CMA, am acting as scribe for RUFUS CHRISTELLA HOLY, MD.   Documentation: I have reviewed the above documentation for accuracy and completeness, and I agree with the above.  RUFUS CHRISTELLA HOLY, MD    "

## 2024-05-21 NOTE — Patient Instructions (Addendum)

## 2024-05-22 ENCOUNTER — Ambulatory Visit: Payer: Self-pay | Admitting: Dermatology

## 2024-05-22 LAB — SURGICAL PATHOLOGY

## 2024-05-23 NOTE — Telephone Encounter (Signed)
Lm for pt to call for results

## 2024-05-23 NOTE — Telephone Encounter (Signed)
-----   Message from Rufus Holy, MD sent at 05/22/2024  8:27 PM EST ----- Mid frontal scalp-BCC- Mohs

## 2024-05-27 NOTE — Progress Notes (Signed)
 Spoke with pt gave her bx results and treatment recommendations

## 2024-06-18 ENCOUNTER — Other Ambulatory Visit

## 2024-06-19 ENCOUNTER — Encounter: Admitting: Dermatology

## 2024-06-24 ENCOUNTER — Ambulatory Visit: Admitting: Obstetrics & Gynecology

## 2024-09-09 ENCOUNTER — Ambulatory Visit: Admitting: Physician Assistant

## 2025-05-19 ENCOUNTER — Encounter: Admitting: Family Medicine
